# Patient Record
Sex: Female | Born: 1980 | Race: White | Hispanic: Yes | Marital: Single | Smoking: Never smoker
Health system: Southern US, Community
[De-identification: ages and names within clinical notes are randomized; demographics above are authoritative.]

## PROBLEM LIST (undated history)

## (undated) ENCOUNTER — Inpatient Hospital Stay (HOSPITAL_COMMUNITY): Payer: Self-pay

## (undated) DIAGNOSIS — O24419 Gestational diabetes mellitus in pregnancy, unspecified control: Secondary | ICD-10-CM

## (undated) DIAGNOSIS — D649 Anemia, unspecified: Secondary | ICD-10-CM

## (undated) HISTORY — DX: Anemia, unspecified: D64.9

## (undated) HISTORY — DX: Gestational diabetes mellitus in pregnancy, unspecified control: O24.419

---

## 2008-03-04 ENCOUNTER — Emergency Department (HOSPITAL_COMMUNITY): Admission: EM | Admit: 2008-03-04 | Discharge: 2008-03-04 | Payer: Self-pay | Admitting: Family Medicine

## 2008-03-24 ENCOUNTER — Ambulatory Visit: Payer: Self-pay | Admitting: Nurse Practitioner

## 2008-03-24 LAB — CONVERTED CEMR LAB
Nitrite: NEGATIVE
Specific Gravity, Urine: >=1.03
WBC Urine, dipstick: NEGATIVE
pH: 5

## 2008-03-25 ENCOUNTER — Ambulatory Visit: Payer: Self-pay | Admitting: *Deleted

## 2008-05-13 ENCOUNTER — Ambulatory Visit: Payer: Self-pay | Admitting: Nurse Practitioner

## 2008-05-13 LAB — CONVERTED CEMR LAB
AST: 15 units/L (ref 0–37)
Albumin: 4.4 g/dL (ref 3.5–5.2)
BUN: 10 mg/dL (ref 6–23)
Basophils Relative: 1 % (ref 0–1)
CO2: 27 meq/L (ref 19–32)
Calcium: 9.1 mg/dL (ref 8.4–10.5)
Chlamydia, DNA Probe: NEGATIVE
Chloride: 103 meq/L (ref 96–112)
Cholesterol: 152 mg/dL (ref 0–200)
Creatinine, Ser: 0.67 mg/dL (ref 0.40–1.20)
Glucose, Bld: 59 mg/dL — ABNORMAL LOW (ref 70–99)
HDL: 47 mg/dL (ref >39)
Hemoglobin: 13.5 g/dL (ref 12.0–15.0)
Ketones, urine, test strip: NEGATIVE
Lymphs Abs: 2.2 10*3/uL (ref 0.7–4.0)
MCHC: 31.7 g/dL (ref 30.0–36.0)
Monocytes Absolute: 0.7 10*3/uL (ref 0.1–1.0)
Monocytes Relative: 9 % (ref 3–12)
Neutro Abs: 5.2 10*3/uL (ref 1.7–7.7)
Nitrite: NEGATIVE
RBC: 4.62 M/uL (ref 3.87–5.11)
Total CHOL/HDL Ratio: 3.2
Triglycerides: 121 mg/dL (ref <150)
Urobilinogen, UA: 0.2
WBC: 8.3 10*3/uL (ref 4.0–10.5)

## 2008-05-14 ENCOUNTER — Encounter (INDEPENDENT_AMBULATORY_CARE_PROVIDER_SITE_OTHER): Payer: Self-pay | Admitting: Nurse Practitioner

## 2008-05-15 ENCOUNTER — Ambulatory Visit: Payer: Self-pay | Admitting: Nurse Practitioner

## 2008-08-05 ENCOUNTER — Ambulatory Visit: Payer: Self-pay | Admitting: Nurse Practitioner

## 2008-08-05 LAB — CONVERTED CEMR LAB: Beta hcg, urine, semiquantitative: NEGATIVE

## 2008-12-24 ENCOUNTER — Encounter: Payer: Self-pay | Admitting: Family Medicine

## 2008-12-24 ENCOUNTER — Ambulatory Visit: Payer: Self-pay | Admitting: Family Medicine

## 2008-12-24 LAB — CONVERTED CEMR LAB
Antibody Screen: NEGATIVE
Eosinophils Relative: 1 % (ref 0–5)
HCT: 40.1 % (ref 36.0–46.0)
Hemoglobin: 13.7 g/dL (ref 12.0–15.0)
Hepatitis B Surface Ag: NEGATIVE
Lymphocytes Relative: 23 % (ref 12–46)
Lymphs Abs: 1.8 10*3/uL (ref 0.7–4.0)
Monocytes Absolute: 0.6 10*3/uL (ref 0.1–1.0)
Neutro Abs: 5.5 10*3/uL (ref 1.7–7.7)
Rh Type: POSITIVE
Rubella: 468.3 intl units/mL — ABNORMAL HIGH
WBC: 7.9 10*3/uL (ref 4.0–10.5)

## 2009-01-07 ENCOUNTER — Ambulatory Visit: Payer: Self-pay | Admitting: Family Medicine

## 2009-01-07 ENCOUNTER — Encounter: Payer: Self-pay | Admitting: Family Medicine

## 2009-01-07 LAB — CONVERTED CEMR LAB
Chlamydia, DNA Probe: NEGATIVE
Hep B S Ab: NEGATIVE

## 2009-01-09 ENCOUNTER — Encounter: Payer: Self-pay | Admitting: Family Medicine

## 2009-01-09 ENCOUNTER — Ambulatory Visit: Payer: Self-pay | Admitting: Family Medicine

## 2009-02-02 ENCOUNTER — Ambulatory Visit: Payer: Self-pay | Admitting: Family Medicine

## 2009-02-02 ENCOUNTER — Encounter: Payer: Self-pay | Admitting: Family Medicine

## 2009-02-16 ENCOUNTER — Encounter: Payer: Self-pay | Admitting: Family Medicine

## 2009-03-03 ENCOUNTER — Encounter: Payer: Self-pay | Admitting: Family Medicine

## 2009-03-03 ENCOUNTER — Ambulatory Visit: Payer: Self-pay | Admitting: Family Medicine

## 2009-03-03 LAB — CONVERTED CEMR LAB: Whiff Test: NEGATIVE

## 2009-04-01 ENCOUNTER — Ambulatory Visit: Payer: Self-pay | Admitting: Family Medicine

## 2009-04-01 ENCOUNTER — Encounter: Payer: Self-pay | Admitting: Family Medicine

## 2009-04-15 ENCOUNTER — Ambulatory Visit: Payer: Self-pay | Admitting: Family Medicine

## 2009-04-16 ENCOUNTER — Ambulatory Visit: Payer: Self-pay | Admitting: Family Medicine

## 2009-04-16 ENCOUNTER — Encounter: Payer: Self-pay | Admitting: Family Medicine

## 2009-05-01 ENCOUNTER — Ambulatory Visit: Payer: Self-pay | Admitting: Family Medicine

## 2009-05-01 ENCOUNTER — Encounter: Payer: Self-pay | Admitting: Family Medicine

## 2009-05-04 LAB — CONVERTED CEMR LAB
Hemoglobin: 10.7 g/dL — ABNORMAL LOW (ref 12.0–15.0)
MCHC: 31.5 g/dL (ref 30.0–36.0)
RDW: 13.4 % (ref 11.5–15.5)

## 2009-05-14 ENCOUNTER — Ambulatory Visit: Payer: Self-pay | Admitting: Family Medicine

## 2009-05-14 ENCOUNTER — Encounter: Payer: Self-pay | Admitting: Family Medicine

## 2009-05-26 ENCOUNTER — Encounter: Payer: Self-pay | Admitting: Family Medicine

## 2009-05-26 ENCOUNTER — Ambulatory Visit: Payer: Self-pay | Admitting: Family Medicine

## 2009-05-27 ENCOUNTER — Encounter: Payer: Self-pay | Admitting: Family Medicine

## 2009-05-27 ENCOUNTER — Inpatient Hospital Stay (HOSPITAL_COMMUNITY): Admission: AD | Admit: 2009-05-27 | Discharge: 2009-05-31 | Payer: Self-pay | Admitting: Obstetrics & Gynecology

## 2009-05-27 ENCOUNTER — Ambulatory Visit: Payer: Self-pay | Admitting: Advanced Practice Midwife

## 2009-05-27 ENCOUNTER — Ambulatory Visit: Payer: Self-pay | Admitting: Family Medicine

## 2009-07-17 ENCOUNTER — Ambulatory Visit: Payer: Self-pay | Admitting: Family Medicine

## 2010-12-17 LAB — GC/CHLAMYDIA PROBE AMP, GENITAL: GC Probe Amp, Genital: NEGATIVE

## 2010-12-17 LAB — URINE MICROSCOPIC-ADD ON

## 2010-12-17 LAB — CBC
HCT: 37 % (ref 36.0–46.0)
MCHC: 34 g/dL (ref 30.0–36.0)
MCV: 87.1 fL (ref 78.0–100.0)
Platelets: 231 10*3/uL (ref 150–400)
RDW: 16.4 % — ABNORMAL HIGH (ref 11.5–15.5)
WBC: 11.2 10*3/uL — ABNORMAL HIGH (ref 4.0–10.5)

## 2010-12-17 LAB — WET PREP, GENITAL
Trich, Wet Prep: NONE SEEN
Yeast Wet Prep HPF POC: NONE SEEN

## 2010-12-17 LAB — URINALYSIS, ROUTINE W REFLEX MICROSCOPIC
Nitrite: NEGATIVE
Protein, ur: NEGATIVE mg/dL
Specific Gravity, Urine: 1.02 (ref 1.005–1.030)
Urobilinogen, UA: 0.2 mg/dL (ref 0.0–1.0)

## 2010-12-17 LAB — FETAL FIBRONECTIN: Fetal Fibronectin: POSITIVE

## 2010-12-17 LAB — STREP B DNA PROBE: Strep Group B Ag: NEGATIVE

## 2010-12-17 LAB — RPR: RPR Ser Ql: NONREACTIVE

## 2010-12-22 LAB — GLUCOSE, CAPILLARY: Glucose-Capillary: 147 mg/dL — ABNORMAL HIGH (ref 70–99)

## 2011-06-09 LAB — POCT I-STAT, CHEM 8
BUN: 7
Calcium, Ion: 1.22
Chloride: 101
Creatinine, Ser: 0.8

## 2011-06-09 LAB — URINE CULTURE: Culture: NO GROWTH

## 2011-06-09 LAB — POCT URINALYSIS DIP (DEVICE)
Glucose, UA: NEGATIVE
Operator id: 235561
Protein, ur: 30 — AB
Specific Gravity, Urine: 1.015
pH: 7

## 2011-07-11 ENCOUNTER — Other Ambulatory Visit: Payer: Self-pay | Admitting: Specialist

## 2011-07-11 DIAGNOSIS — IMO0002 Reserved for concepts with insufficient information to code with codable children: Secondary | ICD-10-CM

## 2011-07-11 DIAGNOSIS — R102 Pelvic and perineal pain: Secondary | ICD-10-CM

## 2011-07-13 ENCOUNTER — Ambulatory Visit
Admission: RE | Admit: 2011-07-13 | Discharge: 2011-07-13 | Disposition: A | Discharge status: Home / Self Care | Payer: No Typology Code available for payment source | Source: Ambulatory Visit | Attending: Specialist | Admitting: Specialist

## 2011-07-13 DIAGNOSIS — IMO0002 Reserved for concepts with insufficient information to code with codable children: Secondary | ICD-10-CM

## 2011-07-13 DIAGNOSIS — R102 Pelvic and perineal pain: Secondary | ICD-10-CM

## 2013-01-02 ENCOUNTER — Ambulatory Visit: Payer: Self-pay | Admitting: Physician Assistant

## 2013-01-02 VITALS — BP 110/66 | HR 78 | Temp 98.5°F | Resp 16 | Ht 64.75 in | Wt 174.6 lb

## 2013-01-02 DIAGNOSIS — G4700 Insomnia, unspecified: Secondary | ICD-10-CM

## 2013-01-02 DIAGNOSIS — L738 Other specified follicular disorders: Secondary | ICD-10-CM

## 2013-01-02 DIAGNOSIS — R4589 Other symptoms and signs involving emotional state: Secondary | ICD-10-CM

## 2013-01-02 DIAGNOSIS — F341 Dysthymic disorder: Secondary | ICD-10-CM

## 2013-01-02 DIAGNOSIS — L853 Xerosis cutis: Secondary | ICD-10-CM

## 2013-01-02 DIAGNOSIS — L659 Nonscarring hair loss, unspecified: Secondary | ICD-10-CM

## 2013-01-02 LAB — TSH: TSH: 2.5 u[IU]/mL (ref 0.350–4.500)

## 2013-01-02 MED ORDER — MELATONIN 3 MG PO CAPS: 3.0000 mg | ORAL_CAPSULE | Freq: Every day | ORAL | Status: DC

## 2013-01-02 NOTE — Patient Instructions (Addendum)
Take the melatonin (3mg ) once about 20-30 minutes before bed.  This will help you sleep.  In one week, you can increase the dose to two pills (6mg ) before bed if you need to.  I will let you know when your labs come back and if we need to treat anything.  Come back in 2 weeks and see me so we can see how you are doing.   Insomnio (Insomnia) El insomnio es un trastorno frecuente en la capacidad para dormirse o para Personal assistant dormido. Puede ser un problema crnico o un trastorno del momento. En ambos casos es un problema frecuente. Puede tratarse de un problema del momento cuando se relaciona con alguna situacin de estrs o preocupacin. Es un trastorno crnico cuando se relaciona con situaciones de Armed forces operational officer las horas de vigilia o con malos hbitos de sueo. Con el tiempo, la privacin del sueo en s misma, puede hacer que el problema empeore. Las cosas ms pequeas se agravan debido al cansancio y a que la capacidad para enfrentarlas disminuye. CAUSAS  Estrs, ansiedad y depresin.  Malos hbitos para dormir.  Distracciones como mirar TV en la cama.  Siestas en horarios prximos a la hora de ir a dormir.  Involucrarse en conversaciones de gran carga emocional antes de ir a dormir.  Leer textos tcnicos antes de dormir.  Consumir alcohol y otros sedantes. Ellos pueden empeorar el problema. Pueden modificar los patrones de sueo normales y la normal [place].  Consumir estimulantes como cafena algunas horas antes de ir a dormir.  Sndromes dolorosos y las dificultades respiratorias pueden causar insomnio.  Realizar ejercicios a ltima hora de la noche.  El cambio en las zonas horarias puede causar trastornos del sueo (jet lag). En algunos casos se recomienda que otra persona observe sus patrones de sueo. Deben observar los perodos en los que no respira durante la noche (apnea del sueo). Tambin deben observar cunto tiempo duran esos perodos. Si vive slo o no tiene  una persona de confianza que lo observe, podr concurrir a una clnica del sueo, en la que lo controlarn de [place] profesional. La apnea del sueo requiere controles y TEFL teacher. Entregue su historia clnica al profesional que lo asiste Tambin infrmele las observaciones que sus familiares hayan hecho con respecto a sus hbitos de sueo.  SNTOMAS  Sentir por la maana que no ha descansado lo suficiente.  Ansiedad y agitacin a la hora de dormir  Dificultad para dormirse o para Arts administrator sueo. TRATAMIENTO  El [employer] indicar un tratamiento para los trastornos subyacentes. Tambin podr aconsejarlo o ayudarlo si usted toma alcohol o se automedica con otras drogas. El tratamiento de los problemas subyacentes generalmente eliminarn los problemas de insomnio.  Podrn prescribirle medicamentos para usar durante un plazo breve. Generalmente no se recomiendan para un uso prolongado.  Generalmente no se recomiendan los medicamentos de venta libre para un uso prolongado. Podran causarle adiccin.  Puede hacer ms fcil el conciliar el sueo si realiza modificaciones en su estilo de vida tales como:  Utilizar tcnicas de relajacin que favorecen la respiracin y reducen la tensin muscular.  No practicar actividad fsica en las ltimas horas del da.  Modificar la dieta y el horario de la ltima comida. No tomar colaciones durante la noche  Trate de establecer una hora habitual para irse a dormir.  La psicoterapia puede ser de utilidad para los problemas que le ocasionan estrs y preocupaciones.  Si hay un ambiente ruidoso y no Advertising account executive, puede ser  til escuchar msica suave o sonidos agradables.  Suspenda los trabajos tediosos y [employer] al menos una hora antes de ir a dormir. INSTRUCCIONES PARA EL CUIDADO DOMICILIARIO  Lleve un diario. Infrmele al profesional que lo asiste sus progresos. Esto incluye todos los efectos secundarios de los medicamentos. Concurra  regularmente a la Psychiatrist con el profesional [place] nota de:  La hora en que se duerme.  Las horas en las que permanece despierto durante la noche.  La calidad del sueo.  Cmo se siente al da siguiente. Esta informacin ayudar a que Biochemist, clinical.   Levntese de la cama si permanece despierto por ms de 15 minutos. Lea o realice alguna actividad tranquila. Mantenga las luces bajas. Espere hasta que sienta sueo y luego vuelva a la cama.  Mantenga un ritmo constante de vigilia y sueo. Evite las siestas.  Practique actividad fsica con regularidad.  Evite las distracciones en el momento de ir a dormir. Entre las distracciones se Production assistant, radio ver televisin o Education officer, environmental alguna actividad intensa o [place], hacer las cuentas de los gastos domsticos.  Programe un ritual para irse a dormir. Mantenga una rutina familiar relacionada con el bao diario, el cepillado de los [place], irse a la cama todas las noches a la misma hora, Tax adviser [place]. La rutina aumenta el xito de conciliar el sueo ms rpido.  Use tcnicas de relajacin. Practique rutinas que favorezcan la respiracin y alivien la tensin muscular. Tambin puede ayudarlo la visualizacin de escenas pacficas. Tambin puede tratar de [employer] pensamientos problemticos o molestos si mantiene la mente ocupada con pensamientos repetitivos o aburridos, como el antiguo consejo de Multimedia programmer. Tambin puede ser ms creativo, e imaginar que planta hermosas flores en su jardn, una detrs de la otra.  Durante el da, trabaje para Chief Strategy Officer. Cuando no es posible, algunas de las sugerencias ya presentadas lo ayudarn a reducir la ansiedad que acompaa las situaciones de estrs. EST SEGURO QUE:   Comprende las instrucciones para el alta mdica.  Controlar su enfermedad.  Solicitar atencin mdica de inmediato segn las indicaciones. Document Released: 08/29/2005 Document Revised:  11/21/2011 [facility] Patient Information 2013 [place], [place].

## 2013-01-02 NOTE — Progress Notes (Signed)
Subjective:    Patient ID: Kara Dominguez, female    DOB: 13-Nov-1980, 32 y.o.   MRN: [id number]  HPI   Ms. Campillo Kara Dominguez is a very pleasant 32 yr old female here with concern for insomnia.  Pt's English is very limited.  Kara, clinical team member, helps with translation.  Pt states that she has been barely able to sleep for the last two months.  States she falls asleep but wakes up around 12am and sometimes doesn't fall back to sleep until 4-5am.  Does say that she gets about 7-8 hours of sleep, but I'm unsure if she is understanding what I'm asking.  During the day she states she sometimes doesn't feel like doing anything.  Lays down, but doesn't actually She has never had problems sleeping before.  Has not taken anything for sleep.  Does state that she often watches television while trying to fall asleep.  Limits caffeine.  States that she has also been having headaches, dry skin, and her hair is falling out.  She has never had problems with her thyroid before.   Does state that she has sometimes felt very sad over the past 1 yr.  She has a 74 yr old son that lives with his father in [place].  She misses him a lot.  Thinks maybe this is why she is feeling sick.  She works at [employer] and is able to work well but is fatigued.  She likes to cook at home when she is not working and still finds enjoyment in this.      Review of Systems  Constitutional: Positive for fatigue. Negative for fever and chills.  HENT: Negative.   Respiratory: Negative.   Cardiovascular: Negative.   Gastrointestinal: Negative.   Musculoskeletal: Negative.   Skin:       Dry skin, hair loss   Neurological: Negative.   Psychiatric/Behavioral: Positive for sleep disturbance (insomnia).       Objective:   Physical Exam  Vitals reviewed. Constitutional: She is oriented to person, place, and time. She appears well-developed and well-nourished. No distress.  HENT:  Head: Normocephalic and atraumatic.  Eyes:  Conjunctivae are normal. No scleral icterus.  Neck: Neck supple. No thyromegaly present.  Cardiovascular: Normal rate, regular rhythm and normal heart sounds.  Exam reveals no gallop and no friction rub.   No murmur heard. Pulmonary/Chest: Effort normal and breath sounds normal. She has no wheezes. She has no rales.  Lymphadenopathy:    She has no cervical adenopathy.  Neurological: She is alert and oriented to person, place, and time.  Skin: Skin is warm and dry.  Very dry skin noted on face at hairline and across cheeks  Psychiatric: She has a normal mood and affect. Her speech is normal and behavior is normal. Thought content normal.  Tearful at times when talking about son    PHQ9 score 11   Filed Vitals:   01/02/13 1431  BP: 110/66  Pulse: 78  Temp: 98.5 F (36.9 C)  Resp: 16        Assessment & Plan:  Insomnia - Plan: TSH, Melatonin 3 MG CAPS  --  Ms. Campillo Kara Dominguez is a 32 yr old female complaining of insomnia.  No history of this.  Etiology is unclear.  Pt feels stress because son is living with his father in [place].  Discussed the possibility of depression with her.  Her PHQ9 score is 11, which is relatively low.  Her affect and mood are normal today.  Will try melatonin for sleep initially.  3mg  qhs this week.  May increase to 6mg  qhs next week if needed.  Discussed sleep hygiene with pt and printed educational materials on this.  Will have pt RTC in 2 weeks to follow up on insomnia and monitor sadness/depressed mood.  Pt understands and is in agreement with this plan.  Dry skin - Plan: TSH  --  Check TSH, will follow up on this and initiate tx if needed  Hair loss - Plan: TSH  -- See above.  Feeling sad - Plan: TSH  --  Discussed the possibility of depression with her.  Her PHQ9 score is low today.  Will have her follow-up in 2 weeks to monitor symptoms.

## 2013-06-26 ENCOUNTER — Ambulatory Visit: Payer: Self-pay | Admitting: Emergency Medicine

## 2013-06-26 VITALS — BP 110/80 | HR 77 | Temp 98.0°F | Resp 16 | Ht 66.0 in | Wt 167.0 lb

## 2013-06-26 DIAGNOSIS — J209 Acute bronchitis, unspecified: Secondary | ICD-10-CM

## 2013-06-26 MED ORDER — HYDROCOD POLST-CHLORPHEN POLST 10-8 MG/5ML PO LQCR: 5.0000 mL | Freq: Two times a day (BID) | ORAL | Status: DC | PRN

## 2013-06-26 MED ORDER — AZITHROMYCIN 250 MG PO TABS: ORAL_TABLET | ORAL | Status: DC

## 2013-06-26 NOTE — Patient Instructions (Signed)
Bronquitis  (Bronchitis)  La bronquitis es una inflamación (el modo que tiene el organismo de reaccionar a una lesión o infección) de los bronquios Los bronquios son los conductos que se extienden desde la tráquea hasta los pulmones. Si la inflamación se agrava, puede causar la falta de aire.  CAUSAS  Las causas de la inflamación pueden ser:  · Un virus  · Gérmenes (bacteria).  · Polvo  · Alergenos  · La polución y muchos otros irritantes  Las células que revisten el árbol bronquial están cubiertas con pequeños pelos (cilias). Esta constantemente producen un movimiento desde los pulmones hacia la boca. De este modo se mantienen los pulmones libres de polución. Cuando estas células se irritan y no pueden cumplir su función, comienza a formarse la mucosidad. Esto produce la característica tos de la bronquitis. La tos es el mecanismo por el cual se limpian los pulmones cuando las cilias no pueden cumplir su función. Sin alguno de estos mecanismos protectores, el material se acumularía en los pulmones Entonces se desarrollaría una pulmonía.   El fumar es una de las causas más frecuentes de bronquitis y puede contribuir a la neumonía. Abandonar este hábito es lo más importante que puede hacer para beneficiarse.  TRATAMIENTO  · El médico le prescribirá antibióticos si la causa es una bacteria, y medicamentos para abrir las vías aéreas y facilitar la respiración. También puede recomendar o prescribir un expectorante. El expectorante aflojará la mucosidad para que pueda eliminarla. Sólo tome medicamentos de venta libre o prescriptos para calmar el dolor, las molestias, o bajar la fiebre según las indicaciones de su médico.  · Eliminar todo lo que causa el problema (por ejemplo el hábito de fumar) es fundamental para evitar que empeore.  · Un antitusígeno puede prescribirse para aliviar los síntomas de la tos.  · Podrán indicarle inhalantes para aliviar los síntomas actuales y ayudar a prevenir problemas futuros.  · Aquellos  que sufren bronquitis crónica (recurrente) puede ser necesaria la administración de corticoides.  SOLICITE ATENCIÓN MÉDICA INMEDIATAMENTE SI:  · Durante el tratamiento observa que elimina esputo similar a pus (purulento).  · Tiene fiebre.  · Su bebé tiene más de 3 meses y su temperatura rectal es de 102° F (38.9° C) o más.  · Su bebé tiene 3 meses o menos y su temperatura rectal es de 100.4° F (38° C) o más.  · Se siente cada vez más enfermo.  · Tiene cada vez más dificultad para respirar, tiene ruidos al respirar o le falta el aire.  Es necesario buscar atención médica inmediata si es una persona de edad avanzada o sufre alguna otra enfermedad.  ASEGURESE DE QUE:   · Comprende estas instrucciones.  · Controlará su enfermedad.  · Solicitará ayuda inmediatamente si no mejora o si empeora.  Document Released: 08/29/2005 Document Revised: 11/21/2011  ExitCare® Patient Information ©2014 ExitCare, LLC.

## 2013-06-26 NOTE — Progress Notes (Signed)
[facility] [address] [place] [address] [phone]  Date:  06/26/2013   Name:  Kara Dominguez   DOB:  1981/01/07   MRN:  [id number]  PCP:  No primary provider on file.    Chief Complaint: Dizziness and Emesis   History of Present Illness:  Kara Dominguez is a 32 y.o. very pleasant female patient who presents with the following:  Ill yesterday with dizziness and sore throat.  Now has a cough productive of green sputum.  No wheezing or shortness of breath. No nausea or vomiting.  No fever or chills.  Has a sore throat.  No coryza.  No rash or  Stool change.  No improvement with over the counter medications or other home remedies. Denies other complaint or health concern today.   There are no active problems to display for this patient.   History reviewed. No pertinent past medical history.  History reviewed. No pertinent past surgical history.  History  Substance Use Topics  . Smoking status: Never Smoker   . Smokeless tobacco: Not on file  . Alcohol Use: Not on file    History reviewed. No pertinent family history.  No Known Allergies  Medication list has been reviewed and updated.  Current Outpatient Prescriptions on File Prior to Visit  Medication Sig Dispense Refill  . Ferrous Sulfate (IRON) 325 (65 FE) MG TABS Take 1 tablet by mouth daily.        . Melatonin 3 MG CAPS Take 1 capsule (3 mg total) by mouth at bedtime.  60 capsule  1  . Prenatal Vit-Fe Fumarate-FA (PRENATAL S) 27-0.8 MG tablet Take 1 tablet by mouth daily.         No current facility-administered medications on file prior to visit.    Review of Systems:  As per HPI, otherwise negative.    Physical Examination: Filed Vitals:   06/26/13 1128  BP: 110/80  Pulse: 77  Temp: 98 F (36.7 C)  Resp: 16   Filed Vitals:   06/26/13 1128  Height: 5\' 6"  (1.676 m)  Weight: 167 lb (75.751 kg)   Body mass index is 26.97 kg/(m^2). Ideal Body Weight: Weight in  (lb) to have BMI = 25: 154.6  GEN: WDWN, NAD, Non-toxic, A & O x 3 HEENT: Atraumatic, Normocephalic. Neck supple. No masses, No LAD. Ears and Nose: No external deformity. CV: RRR, No M/G/R. No JVD. No thrill. No extra heart sounds. PULM: CTA B, no wheezes, crackles, rhonchi. No retractions. No resp. distress. No accessory muscle use. ABD: S, NT, ND, +BS. No rebound. No HSM. EXTR: No c/c/e NEURO Normal gait.  PSYCH: Normally interactive. Conversant. Not depressed or anxious appearing.  Calm demeanor.    Assessment and Plan: Bronchitis tussionex zpak  Signed,  Phillips Odor, MD

## 2014-04-04 ENCOUNTER — Ambulatory Visit: Payer: Self-pay

## 2014-05-27 ENCOUNTER — Encounter (HOSPITAL_COMMUNITY): Payer: Self-pay | Admitting: Emergency Medicine

## 2014-05-27 DIAGNOSIS — Z79899 Other long term (current) drug therapy: Secondary | ICD-10-CM | POA: Insufficient documentation

## 2014-05-27 DIAGNOSIS — O9989 Other specified diseases and conditions complicating pregnancy, childbirth and the puerperium: Secondary | ICD-10-CM | POA: Insufficient documentation

## 2014-05-27 DIAGNOSIS — O2390 Unspecified genitourinary tract infection in pregnancy, unspecified trimester: Secondary | ICD-10-CM | POA: Insufficient documentation

## 2014-05-27 DIAGNOSIS — N390 Urinary tract infection, site not specified: Secondary | ICD-10-CM | POA: Insufficient documentation

## 2014-05-27 LAB — URINALYSIS, ROUTINE W REFLEX MICROSCOPIC
Bilirubin Urine: NEGATIVE
Glucose, UA: 250 mg/dL — AB
KETONES UR: NEGATIVE mg/dL
Nitrite: NEGATIVE
PROTEIN: 30 mg/dL — AB
Specific Gravity, Urine: 1.017 (ref 1.005–1.030)
UROBILINOGEN UA: 1 mg/dL (ref 0.0–1.0)
pH: 7.5 (ref 5.0–8.0)

## 2014-05-27 LAB — URINE MICROSCOPIC-ADD ON

## 2014-05-27 NOTE — ED Notes (Signed)
Pt reports dysuria, urinary frequency and hematuria since AM. Pt denies flank pain. Denies fever/chills. Pt [redacted] weeks pregnant, denies abdominal cramping or vaginal bleeding. NAD.

## 2014-05-28 ENCOUNTER — Emergency Department (HOSPITAL_COMMUNITY)
Admission: EM | Admit: 2014-05-28 | Discharge: 2014-05-28 | Disposition: A | Discharge status: Home / Self Care | Payer: Self-pay | Attending: Emergency Medicine | Admitting: Emergency Medicine

## 2014-05-28 DIAGNOSIS — N390 Urinary tract infection, site not specified: Secondary | ICD-10-CM

## 2014-05-28 DIAGNOSIS — Z3490 Encounter for supervision of normal pregnancy, unspecified, unspecified trimester: Secondary | ICD-10-CM

## 2014-05-28 MED ORDER — CEPHALEXIN 250 MG PO CAPS
500.0000 mg | ORAL_CAPSULE | Freq: Once | ORAL | Status: AC
  Administered 2014-05-28: 500 mg via ORAL
  Filled 2014-05-28: qty 2

## 2014-05-28 MED ORDER — CEPHALEXIN 500 MG PO CAPS: 500.0000 mg | ORAL_CAPSULE | Freq: Four times a day (QID) | ORAL | Status: DC

## 2014-05-28 NOTE — ED Provider Notes (Signed)
CSN: [id number]     Arrival date & time 05/27/14  2136 History   First MD Initiated Contact with Patient 05/28/14 0146     Chief Complaint  Patient presents with  . Dysuria  . Hematuria     (Consider location/radiation/quality/duration/timing/severity/associated sxs/prior Treatment) HPI Comments: G4P3 with normal pregnancy thus far, about 4 months pregnant per [place] comes in with cc of dysuria, polyuria and hematuria. Pt reports having the symptoms starting last night. Pt has pain only with urination. No cramping abd pain, no vaginal bleeding (only hematuria), no vaginal discharge, no trauma. Pt denies back pain. No nausea, emesis, fevers, chills.  Patient is a 33 y.o. female presenting with dysuria and hematuria. The history is provided by the patient and the spouse.  Dysuria Associated symptoms: no abdominal pain, no flank pain, no nausea and no vomiting   Hematuria Pertinent negatives include no chest pain, no abdominal pain, no headaches and no shortness of breath.    No past medical history on file. History reviewed. No pertinent past surgical history. No family history on file. History  Substance Use Topics  . Smoking status: Never Smoker   . Smokeless tobacco: Not on file  . Alcohol Use: Not on file   OB History   Grav Para Term Preterm Abortions TAB SAB Ect Mult Living   1              Review of Systems  Constitutional: Negative for activity change.  Respiratory: Negative for shortness of breath.   Cardiovascular: Negative for chest pain.  Gastrointestinal: Negative for nausea, vomiting and abdominal pain.  Genitourinary: Positive for dysuria, frequency and hematuria. Negative for flank pain.  Musculoskeletal: Negative for neck pain.  Neurological: Negative for headaches.      Allergies  Review of patient's allergies indicates no known allergies.  Home Medications   Prior to Admission medications   Medication Sig Start Date End Date Taking? Authorizing  Provider  Ferrous Sulfate (IRON) 325 (65 FE) MG TABS Take 1 tablet by mouth daily.     Yes Historical Provider, MD  Prenatal Vit-Fe Fumarate-FA (PRENATAL S) 27-0.8 MG tablet Take 1 tablet by mouth daily.     Yes Historical Provider, MD   BP 112/61  Pulse 83  Temp(Src) 98.1 F (36.7 C) (Oral)  Resp 16  SpO2 100% Physical Exam  Nursing note and vitals reviewed. Constitutional: She is oriented to person, place, and time. She appears well-developed and well-nourished.  HENT:  Head: Normocephalic and atraumatic.  Eyes: EOM are normal. Pupils are equal, round, and reactive to light.  Neck: Neck supple.  Cardiovascular: Normal rate, regular rhythm and normal heart sounds.   No murmur heard. Pulmonary/Chest: Effort normal. No respiratory distress.  Abdominal: Soft. She exhibits no distension. There is no tenderness. There is no rebound and no guarding.  Neurological: She is alert and oriented to person, place, and time.  Skin: Skin is warm and dry.    ED Course  Procedures (including critical care time) Labs Review Labs Reviewed  URINALYSIS, ROUTINE W REFLEX MICROSCOPIC - Abnormal; Notable for the following:    APPearance CLOUDY (*)    Glucose, UA 250 (*)    Hgb urine dipstick LARGE (*)    Protein, ur 30 (*)    Leukocytes, UA LARGE (*)    All other components within normal limits  URINE MICROSCOPIC-ADD ON - Abnormal; Notable for the following:    Bacteria, UA MANY (*)    All other components within normal  limits  URINE CULTURE    Imaging Review No results found.   EKG Interpretation None      MDM   Final diagnoses:  UTI (lower urinary tract infection)  Pregnancy   Pt comes in with cc of dysuria, hematuria, and polyuria. Pt is pregnant, and US shows UTI. Pt has a complex UTi given she is pregnant. No systemic signs, PO challenge passed. Keflex prescribed. Pt has no abd pain, vaginal bleeding. FHT in the 130s, which is reassuring. Will d.c. Return precautions  discussed. She has OB appt in 10 days,  Derwood Kaplan, MD 05/28/14 [phone]

## 2014-05-28 NOTE — Discharge Instructions (Signed)
Embarazo e infeccin del tracto urinario (Pregnancy and Urinary Tract Infection) Una infeccin urinaria (IU) puede ocurrir en Clinical cytogeneticist del tracto urinario. La infeccin urinaria puede [employer]s utteres, los riones (pielonefritis), la vejiga (cistitis) y Geologist, engineering (uretritis). Todas las mujeres embarazadas deben ser estudiadas para diagnosticar la presencia de bacterias en el tracto urinario. La identificacin y el tratamiento de una infeccin urinaria disminuye el riesgo de un parto prematuro y de Actor infecciones ms graves en la madre y el beb. CAUSAS Las bacterias causan casi todas las infecciones urinarias.  FACTORES DE RIESGO Hay muchos factores que pueden aumentar sus probabilidades de contraer una infeccin urinaria (IU) durante el [place]. Pueden ser:  Lucilla Edin uretra corta.  Falta de aseo y malos hbitos de higiene.  [place].  Obstruccin de la orina en el tracto urinario.  Problemas con los msculos o nervios plvicos.  Diabetes.  Obesidad.  Problemas en la vejiga despus de tener varios hijos.  Antecedentes de infeccin urinaria. SIGNOS Y SNTOMAS   Dolor, ardor o sensacin de ardor al [employer].  Sentir la necesidad de Garment/textile technologist de inmediato [place]).  Prdida del control vesical (incontinencia urinaria).  Orinar con ms frecuencia de lo comn en el embarazo.  Malestar en la zona inferior del abdomen o en la espalda.  Bennie Hind turbia.  Sangre en la orina (hematuria).  Cristy Hilts. Cuando se infectan los riones, los sntomas pueden ser:  Dolor de espalda.  Dolor lateral en el lado derecho ms que en el lado izquierdo.  Cristy Hilts.  Escalofros.  Nuseas.  Vmitos. DIAGNSTICO  Una infeccin del tracto urinario se suele diagnosticar a travs de la orina. A veces se realizan pruebas y procedimientos adicionales. Estos pueden ser:  Steward Drone de los riones, los urteres, la vejiga y Geologist, engineering.  Observar la vejiga con un  tubo que ilumina (cistoscopa). TRATAMIENTO Por lo general, las IU pueden tratarse con medicamentos antibiticos.  INSTRUCCIONES PARA EL CUIDADO EN EL HOGAR   Tome slo medicamentos de venta libre o recetados, segn las indicaciones del mdico. Si le han recetado antibiticos, tmelos segn las indicaciones. Tmelos todos, aunque se sienta mejor.  Beba suficiente lquido para Consulting civil engineer orina clara o de color amarillo plido.  No tenga relaciones sexuales hasta que la infeccin haya desaparecido o el mdico la autorice.  Asegrese de [place] [employer] hagan estudios para Hydrographic surveyor una infeccin urinaria durante el [place]. Estas infecciones suelen reaparecer. Para prevenir una infeccin urinaria en el futuro  Practique buenos hbitos higinicos. Siempre debe limpiarse desde adelante hacia atrs. Use el tissue slo una vez.  No retenga la orina. Orine tan pronto como sea posible cuando tenga ganas.  No se haga duchas vaginales ni use desodorantes en aerosol.  Lave con agua tibia y jabn alrededor de la zona genital y el ano.  Vace la vejiga antes y despus de Clinical biochemist.  Use ropa interior con algodn en la entrepierna.  Evite la cafena y las bebidas gaseosas. Estas sustancias irritan la vejiga.  Beba jugo de arndanos o tome comprimidos de arndano. Esto puede disminuir el riesgo de sufrir una infeccin urinaria.  No beba alcohol.  Cumpla con las visitas de control y hgase todos los anlisis segn lo programado. SOLICITE ATENCIN MDICA SI:   Los sntomas empeoran.  Tiene fiebre an despus de 2 das [place].  Tiene una erupcin.  Siente que usted tiene problemas con los medicamentos recetados.  Tiene flujo vaginal anormal. SOLICITE ATENCIN MDICA DE INMEDIATO SI:  Siente dolor en la espalda o a los lados. °· Tiene escalofríos. °· Observa sangre en la orina. °· Tiene náuseas o vómitos. °· Siente contracciones en el útero. °· Tiene una  perdida de líquido en chorro por la vagina. °ASEGÚRESE DE QUE: °· Comprende estas instrucciones.   °· Controlará su afección.   °· Recibirá ayuda de inmediato si no mejora o si empeora.   °Document Released: 05/23/2012 Document Revised: 06/19/2013 °ExitCare® Patient Information ©2015 ExitCare, LLC. This information is not intended to replace advice given to you by your health care provider. Make sure you discuss any questions you have with your health care provider. ° °

## 2014-05-30 LAB — URINE CULTURE

## 2014-05-31 ENCOUNTER — Telehealth (HOSPITAL_BASED_OUTPATIENT_CLINIC_OR_DEPARTMENT_OTHER): Payer: Self-pay | Admitting: Emergency Medicine

## 2014-05-31 NOTE — Telephone Encounter (Signed)
Post ED Visit - Positive Culture Follow-up  Culture report reviewed by antimicrobial stewardship pharmacist:  Wes Dulaney, Pharm.D., BCPS  Celedonio Miyamoto, [address].D., BCPS  Georgina Pillion, Pharm.D., BCPS  [place], [address].D., BCPS, AAHIVP  Estella Husk, Pharm.D., BCPS, AAHIVP  Carly Sabat, Pharm.D.  Enzo Bi, [address].D.  Positive urine culture Treated with Cephalexin  po caps qid x 5 days, organism sensitive to the same and no further patient follow-up is required at this time.  Berle Mull 05/31/2014, 5:10 PM

## 2014-06-02 ENCOUNTER — Other Ambulatory Visit: Payer: Self-pay

## 2014-06-02 DIAGNOSIS — Z331 Pregnant state, incidental: Secondary | ICD-10-CM

## 2014-06-02 NOTE — Progress Notes (Signed)
NEW OB LABS DONE TODAY Kara Dominguez 

## 2014-06-03 LAB — OBSTETRIC PANEL
ANTIBODY SCREEN: NEGATIVE
BASOS ABS: 0 10*3/uL (ref 0.0–0.1)
BASOS PCT: 0 % (ref 0–1)
EOS ABS: 0.3 10*3/uL (ref 0.0–0.7)
EOS PCT: 3 % (ref 0–5)
HEMATOCRIT: 36.9 % (ref 36.0–46.0)
Hemoglobin: 12.1 g/dL (ref 12.0–15.0)
Hepatitis B Surface Ag: NEGATIVE
Lymphocytes Relative: 16 % (ref 12–46)
Lymphs Abs: 1.4 10*3/uL (ref 0.7–4.0)
MCH: 29 pg (ref 26.0–34.0)
MCHC: 32.8 g/dL (ref 30.0–36.0)
MCV: 88.5 fL (ref 78.0–100.0)
MONO ABS: 0.7 10*3/uL (ref 0.1–1.0)
Monocytes Relative: 8 % (ref 3–12)
Neutro Abs: 6.4 10*3/uL (ref 1.7–7.7)
Neutrophils Relative %: 73 % (ref 43–77)
PLATELETS: 262 10*3/uL (ref 150–400)
RBC: 4.17 MIL/uL (ref 3.87–5.11)
RDW: 13.4 % (ref 11.5–15.5)
RUBELLA: 9.73 {index} — AB (ref <0.90)
Rh Type: POSITIVE
WBC: 8.7 10*3/uL (ref 4.0–10.5)

## 2014-06-03 LAB — HIV ANTIBODY (ROUTINE TESTING W REFLEX): HIV: NONREACTIVE

## 2014-06-03 LAB — CULTURE, OB URINE
COLONY COUNT: NO GROWTH
Organism ID, Bacteria: NO GROWTH

## 2014-06-09 ENCOUNTER — Ambulatory Visit (INDEPENDENT_AMBULATORY_CARE_PROVIDER_SITE_OTHER): Payer: Self-pay | Admitting: Family Medicine

## 2014-06-09 ENCOUNTER — Other Ambulatory Visit (HOSPITAL_COMMUNITY): Admission: RE | Admit: 2014-06-09 | Payer: Self-pay | Source: Ambulatory Visit | Admitting: Family Medicine

## 2014-06-09 ENCOUNTER — Encounter: Payer: Self-pay | Admitting: Family Medicine

## 2014-06-09 VITALS — BP 120/68 | HR 91 | Temp 98.6°F | Wt 195.0 lb

## 2014-06-09 DIAGNOSIS — Z331 Pregnant state, incidental: Secondary | ICD-10-CM

## 2014-06-09 DIAGNOSIS — Z124 Encounter for screening for malignant neoplasm of cervix: Secondary | ICD-10-CM

## 2014-06-09 DIAGNOSIS — E669 Obesity, unspecified: Secondary | ICD-10-CM

## 2014-06-09 DIAGNOSIS — Z23 Encounter for immunization: Secondary | ICD-10-CM

## 2014-06-09 DIAGNOSIS — Z3490 Encounter for supervision of normal pregnancy, unspecified, unspecified trimester: Secondary | ICD-10-CM

## 2014-06-09 DIAGNOSIS — Z8751 Personal history of pre-term labor: Secondary | ICD-10-CM

## 2014-06-09 LAB — GLUCOSE, CAPILLARY: Glucose-Capillary: 188 mg/dL — ABNORMAL HIGH (ref 70–99)

## 2014-06-09 NOTE — Patient Instructions (Signed)
Segundo trimestre del embarazo  (Second Trimester of Pregnancy)  El segundo trimestre del embarazo se extiende desde la semana 13 hasta la semana 28, del 4 al 6 mes. En general, es el momento del embarazo en el que se sentir mejor. En general, las nuseas matutinas disminuyen o desaparecen. Tendr ms energa y podr aumentarle el apetito. El beb por nacer (feto) se desarrolla rpidamente. Hacia el final del sexto mes, el beb mide aproximadamente 9 pulgadas (23 cm) y pesa alrededor de 1 libras (700 g). Es probable que sienta mover al beb (dar pataditas) entre las 18 y 20 semanas del embarazo. CUIDADOS EN EL HOGAR   Evite fumar, consumir hierbas y beber alcohol. Evite los frmacos que no apruebe el mdico.  Slo tome los medicamentos que le haya indicado su mdico. Algunos medicamentos son seguros para tomar durante el embarazo y otros no lo son.  Haga ejercicios slo como le indique el mdico. Deje de hacer ejercicios si comienza a tener clicos.  Haga comidas regulares y sanas.  Use un sostn que le brinde buen soporte si sus mamas estn sensibles.  No utilice la baera con agua caliente, baos turcos y saunas.  Colquese el cinturn de seguridad cuando conduzca.  Evite comer carne cruda y el contacto con los utensilios y desperdicios de los gatos.  Tome las vitaminas indicadas para la etapa prenatal.  Trate de tomar medicamentos para mover el intestino (laxantes) segn lo necesario y si su mdico la autoriza. Consuma ms fibra comiendo frutas y vegetales frescos y granos enteros. Beba gran cantidad de lquido para mantener el pis (orina) de tono claro o amarillo plido.  Tome baos de agua tibia (baos de asiento) para calmar el dolor o las molestias causadas por las hemorroides. Use una crema para las hemorroides si el mdico la autoriza.  Si tiene venas hinchadas y abultadas (venas varicosas), use medias de soporte. Eleve (levante) los pies durante 15 minutos, 3 o 4 veces por  da. Limite el consumo de sal en su dieta.  Evite levantar objetos pesados, usar tacones altos y sintese derecha.  Descanse con las piernas elevadas si tiene calambres o dolor de cintura.  Visite a su dentista si no lo ha hecho durante el embarazo. Use un cepillo de dientes blando para higienizarse los dientes. Use suavemente el hilo dental.  Puede tener sexo (relaciones sexuales) siempre que el mdico la autorice.  Concurra a los controles mdicos. SOLICITE AYUDA SI:   Siente mareos.  Siente clicos intensos en el estmago, en la espalda o en el vientre (abdomen).  Siente un dolor persistente en la zona del vientre.  Tiene malestar estomacal (nuseas), devuelve (vomita), o tiene deposiciones acuosas (diarrea).  Advierte un olor ftido que proviene de la vagina.  Siente dolor al hacer pis (orinar). SOLICITE AYUDA DE INMEDIATO SI:   Tiene fiebre.  Pierde lquido por la vagina.  Tiene sangrando o pequeas prdidas vaginales.  Siente dolor intenso o clicos en el abdomen.  Sube o baja de peso rpidamente.  Tiene dificultad para respirar o siente dolor en el pecho.  Sbitamente se le hinchan el rostro, las manos, los tobillos, los pies o las piernas.  No ha sentido los movimientos del beb durante una hora.  Siente un dolor de cabeza intenso que no se alivia con medicamentos.  Su visin se modifica. Document Released: 05/01/2013 ExitCare Patient Information 2015 [employer]. This information is not intended to replace advice given to you by your health care provider. Make sure you   discuss any questions you have with your health care provider.  

## 2014-06-09 NOTE — Progress Notes (Signed)
No AAM interpretor.  Used Artist ID# [id number]

## 2014-06-09 NOTE — Progress Notes (Signed)
Kara Dominguez is a 33 y.o. yo [phone] at [redacted]w[redacted]d who presents for her initial prenatal visit. Pregnancy is planned She reports decreased fetal movement previously while she was on the keflex for her UTI. She decreased the frequency of taking this and the movement normalized. She has noted good movement today.. She  is taking PNV. See flow sheet for details.  PMH, POBH, FH, meds, allergies and Social Hx reviewed.  Prenatal exam:Gen: Well nourished, well developed.  No distress.  Vitals noted. HEENT: Normocephalic, atraumatic.  Abd: soft, NTND. +BS.  Uterus not appreciated above umbilicus. GU: Normal external female genitalia without lesions.  Nl vaginal, well rugated without lesions. No vaginal discharge.  Bimanual exam: No adnexal mass or TTP. No CMT.   Ext: No clubbing, cyanosis or edema. Psych: Normal grooming and dress.  Not depressed or anxious appearing.  Normal thought content and process without flight of ideas or looseness of associations   Assessment/plan: 1) Pregnancy [redacted]w[redacted]d doing well.  Current pregnancy issues include prior E coli UTI now adequately treated with negative OB UCx, prior preterm delivery, elevated 1 hour early glucola. Patient will return for 3 hour glucola. Patient will be referred to high risk clinic given prior delivery <34 weeks for consideration of 17-P shots and also likely for GDM pending 3 hour glucola. Dating is reliable Prenatal labs reviewed, notable for all normal. Bleeding and pain precautions reviewed. Importance of prenatal vitamins reviewed.  Genetic screening offered.  Early glucola is indicated. Kick counts reviewed. She is to stop taking her keflex at this time given negative culture.    Follow up 4 weeks.

## 2014-06-10 ENCOUNTER — Encounter: Payer: Self-pay | Admitting: Family Medicine

## 2014-06-10 DIAGNOSIS — Z8751 Personal history of pre-term labor: Secondary | ICD-10-CM | POA: Insufficient documentation

## 2014-06-10 LAB — CERVICOVAGINAL ANCILLARY ONLY
CHLAMYDIA, DNA PROBE: NEGATIVE
Neisseria Gonorrhea: NEGATIVE

## 2014-06-10 LAB — CYTOLOGY - PAP

## 2014-06-11 ENCOUNTER — Encounter: Payer: Self-pay | Admitting: Family Medicine

## 2014-06-13 ENCOUNTER — Other Ambulatory Visit: Payer: Self-pay | Admitting: Family Medicine

## 2014-06-13 ENCOUNTER — Ambulatory Visit (HOSPITAL_COMMUNITY)
Admission: RE | Admit: 2014-06-13 | Discharge: 2014-06-13 | Disposition: A | Discharge status: Home / Self Care | Payer: Self-pay | Source: Ambulatory Visit | Attending: Family Medicine | Admitting: Family Medicine

## 2014-06-13 DIAGNOSIS — Z3490 Encounter for supervision of normal pregnancy, unspecified, unspecified trimester: Secondary | ICD-10-CM

## 2014-06-13 DIAGNOSIS — Z3689 Encounter for other specified antenatal screening: Secondary | ICD-10-CM

## 2014-06-13 DIAGNOSIS — O09522 Supervision of elderly multigravida, second trimester: Secondary | ICD-10-CM

## 2014-06-13 DIAGNOSIS — Z3482 Encounter for supervision of other normal pregnancy, second trimester: Secondary | ICD-10-CM | POA: Insufficient documentation

## 2014-06-13 DIAGNOSIS — Z3A19 19 weeks gestation of pregnancy: Secondary | ICD-10-CM

## 2014-06-16 ENCOUNTER — Other Ambulatory Visit (INDEPENDENT_AMBULATORY_CARE_PROVIDER_SITE_OTHER): Payer: Self-pay

## 2014-06-16 DIAGNOSIS — Z331 Pregnant state, incidental: Secondary | ICD-10-CM

## 2014-06-16 DIAGNOSIS — Z3482 Encounter for supervision of other normal pregnancy, second trimester: Secondary | ICD-10-CM

## 2014-06-16 LAB — GLUCOSE, CAPILLARY: GLUCOSE-CAPILLARY: 78 mg/dL (ref 70–99)

## 2014-06-16 NOTE — Progress Notes (Signed)
3 HR GTT DONE TODAY Dwane Andres 

## 2014-06-17 LAB — GLUCOSE TOLERANCE, 3 HOURS
GLUCOSE 3 HOUR GTT: 111 mg/dL (ref 70–144)
GLUCOSE, 2 HOUR-GESTATIONAL: 168 mg/dL — AB (ref 70–164)
GLUCOSE, FASTING-GESTATIONAL: 78 mg/dL (ref 70–104)
Glucose Tolerance, 1 hour: 190 mg/dL — ABNORMAL HIGH (ref 70–189)

## 2014-07-07 ENCOUNTER — Other Ambulatory Visit: Payer: Self-pay | Admitting: Obstetrics and Gynecology

## 2014-07-07 ENCOUNTER — Encounter: Payer: Self-pay | Admitting: Obstetrics and Gynecology

## 2014-07-07 ENCOUNTER — Ambulatory Visit (INDEPENDENT_AMBULATORY_CARE_PROVIDER_SITE_OTHER): Payer: Self-pay | Admitting: Obstetrics and Gynecology

## 2014-07-07 VITALS — BP 104/87 | HR 91 | Temp 98.0°F | Wt 197.2 lb

## 2014-07-07 DIAGNOSIS — Z8751 Personal history of pre-term labor: Secondary | ICD-10-CM

## 2014-07-07 DIAGNOSIS — Z3492 Encounter for supervision of normal pregnancy, unspecified, second trimester: Secondary | ICD-10-CM

## 2014-07-07 DIAGNOSIS — O24419 Gestational diabetes mellitus in pregnancy, unspecified control: Secondary | ICD-10-CM | POA: Insufficient documentation

## 2014-07-07 DIAGNOSIS — O0932 Supervision of pregnancy with insufficient antenatal care, second trimester: Secondary | ICD-10-CM

## 2014-07-07 DIAGNOSIS — O35EXX0 Maternal care for other (suspected) fetal abnormality and damage, fetal genitourinary anomalies, not applicable or unspecified: Secondary | ICD-10-CM

## 2014-07-07 DIAGNOSIS — O358XX0 Maternal care for other (suspected) fetal abnormality and damage, not applicable or unspecified: Secondary | ICD-10-CM

## 2014-07-07 DIAGNOSIS — O0990 Supervision of high risk pregnancy, unspecified, unspecified trimester: Secondary | ICD-10-CM | POA: Insufficient documentation

## 2014-07-07 DIAGNOSIS — O283 Abnormal ultrasonic finding on antenatal screening of mother: Secondary | ICD-10-CM

## 2014-07-07 DIAGNOSIS — O0930 Supervision of pregnancy with insufficient antenatal care, unspecified trimester: Secondary | ICD-10-CM | POA: Insufficient documentation

## 2014-07-07 DIAGNOSIS — O0992 Supervision of high risk pregnancy, unspecified, second trimester: Secondary | ICD-10-CM

## 2014-07-07 LAB — POCT URINALYSIS DIP (DEVICE)
Bilirubin Urine: NEGATIVE
GLUCOSE, UA: NEGATIVE mg/dL
KETONES UR: NEGATIVE mg/dL
LEUKOCYTES UA: NEGATIVE
Nitrite: NEGATIVE
PROTEIN: NEGATIVE mg/dL
SPECIFIC GRAVITY, URINE: 1.02 (ref 1.005–1.030)
UROBILINOGEN UA: 0.2 mg/dL (ref 0.0–1.0)
pH: 7 (ref 5.0–8.0)

## 2014-07-07 MED ORDER — PRENATAL VITAMINS 0.8 MG PO TABS: 1.0000 | ORAL_TABLET | Freq: Every day | ORAL | Status: DC

## 2014-07-07 MED ORDER — HYDROXYPROGESTERONE CAPROATE 250 MG/ML IM OIL: 250.0000 mg | TOPICAL_OIL | Freq: Once | INTRAMUSCULAR | Status: DC

## 2014-07-07 MED ORDER — PROGESTERONE MICRONIZED 200 MG PO CAPS: 200.0000 mg | ORAL_CAPSULE | Freq: Every day | ORAL | Status: DC

## 2014-07-07 MED ORDER — IRON 325 (65 FE) MG PO TABS: 1.0000 | ORAL_TABLET | Freq: Every day | ORAL | Status: DC

## 2014-07-07 NOTE — Progress Notes (Signed)
Here for Initial visit, [facility]. Used interpreter Darel HongMariaElena Jimenez. Given new patient information.

## 2014-07-07 NOTE — Progress Notes (Addendum)
Nutrition Note:  1st visit Pt seen for GDM diet education Wt. Gain > expected. Pt. Given verbal & written GDM education Eats 3 meals/day with a variety of foods.  No food allergies & no N/V reported.  PNV daily. Agrees to follow GDM diet including 3 meals and 3 snacks and proper CHO/protein combination. Discussed wt. Gain goals of 15-25# and incorporating physical activity. [facility] F/u in 2-4 weeks. Candice C. Earlene Plateravis, MPH, RD, LDN

## 2014-07-07 NOTE — Addendum Note (Signed)
Addended by: William DaltonMCEACHERN, Drewey Begue A on: 07/07/2014 11:21 AM   Modules accepted: Orders

## 2014-07-07 NOTE — Progress Notes (Signed)
Initial OB today. [facility] for history of preterm delivery and abnormal 3 hour gtt.  1. GDM, new diagnosis. Has not been checking blood sugars and diabetic educator not here today. Nutrition counseling today. Return to clinic in 1 week for diabetic education.  2. History of preterm delivery at ~6 months gestation. Start 17P today.  3. Unilateral pyectasis noted on fetal anatomy scan. Repeat at 32 weeks.  4. Routine PNC. Labs reviewed today. GC/CT collected today. PTL/FM precautions reviewed.

## 2014-07-08 LAB — GC/CHLAMYDIA PROBE AMP
CT PROBE, AMP APTIMA: NEGATIVE
GC PROBE AMP APTIMA: NEGATIVE

## 2014-07-10 ENCOUNTER — Telehealth: Payer: Self-pay | Admitting: *Deleted

## 2014-07-10 NOTE — Telephone Encounter (Signed)
Message copied by Henri MedalHARTSELL, JAZMIN M on Thu Jul 10, 2014  4:24 PM ------      Message from: Glori LuisSONNENBERG, ERIC G      Created: Tue Jul 08, 2014  6:52 PM       [facility] She does not need to keep her 07/16/14 appointment with me. Could you please call her to cancel this. [facility] Thanks.             Minerva AreolaEric       ----- Message -----         From: Feliz BeamJazmin Hartsell, CMA         Sent: 06/26/2014   8:45 AM           To: Glori LuisEric G Sonnenberg, MD            Pt has not been called yet or scheduled.  Did try calling their office [phone] but there was no answer or option to leave a message. Jazmin Hartsell,CMA      ----- Message -----         From: Glori LuisEric G Sonnenberg, MD         Sent: 06/25/2014   5:36 PM           To: [facility]            Patient was referred to high risk OB clinic. It looks like it is awaiting scheduling. Could you check as to why she has not yet been scheduled? Thanks.            Eric              ------

## 2014-07-10 NOTE — Telephone Encounter (Signed)
Pt is aware and appt with Dr. Birdie SonsSonnenberg was cancelled. Jazmin Hartsell,CMA

## 2014-07-14 ENCOUNTER — Encounter: Payer: Self-pay | Admitting: Obstetrics and Gynecology

## 2014-07-14 ENCOUNTER — Ambulatory Visit (INDEPENDENT_AMBULATORY_CARE_PROVIDER_SITE_OTHER): Payer: Self-pay | Admitting: Family Medicine

## 2014-07-14 ENCOUNTER — Encounter: Payer: Self-pay | Attending: Family Medicine | Admitting: *Deleted

## 2014-07-14 VITALS — BP 104/58 | HR 87 | Temp 97.4°F | Wt 193.6 lb

## 2014-07-14 DIAGNOSIS — O24419 Gestational diabetes mellitus in pregnancy, unspecified control: Secondary | ICD-10-CM | POA: Insufficient documentation

## 2014-07-14 DIAGNOSIS — O0992 Supervision of high risk pregnancy, unspecified, second trimester: Secondary | ICD-10-CM

## 2014-07-14 DIAGNOSIS — Z3A24 24 weeks gestation of pregnancy: Secondary | ICD-10-CM | POA: Insufficient documentation

## 2014-07-14 DIAGNOSIS — Z713 Dietary counseling and surveillance: Secondary | ICD-10-CM | POA: Insufficient documentation

## 2014-07-14 LAB — POCT URINALYSIS DIP (DEVICE)
Bilirubin Urine: NEGATIVE
Glucose, UA: NEGATIVE mg/dL
Hgb urine dipstick: NEGATIVE
Ketones, ur: 15 mg/dL — AB
NITRITE: NEGATIVE
PH: 5.5 (ref 5.0–8.0)
PROTEIN: NEGATIVE mg/dL
Specific Gravity, Urine: >=1.03 (ref 1.005–1.030)
Urobilinogen, UA: 0.2 mg/dL (ref 0.0–1.0)

## 2014-07-14 NOTE — Progress Notes (Signed)
Kara Dominguez present for Spanish interpreter.

## 2014-07-14 NOTE — Progress Notes (Signed)
Follow-up U/S with Radiology 09/08/14 @ 915a.  VBAC consent reviewed with patient, obtained signature, given back to Dr. Loreta AveAcosta for review.  Spanish interpreter IrvonaBlanca present.

## 2014-07-14 NOTE — Progress Notes (Signed)
  Patient was seen on 07/14/14 for Gestational Diabetes self-management class at [facility]. The following learning objectives were met by the patient during this course:   States the definition of Gestational Diabetes  States when to check blood glucose levels  Demonstrates proper blood glucose monitoring techniques  Blood glucose monitor given: True Track Blood glucose reading: within target range  Patient instructed to monitor glucose levels: FBS: 60 - <90 2 hour: <120  *Patient received handouts:  Nutrition Diabetes and Pregnancy  Carbohydrate Counting List  Patient will be seen for follow-up as needed.   

## 2014-07-14 NOTE — Progress Notes (Signed)
Patient is 33 y.o. [id number] 8842w1d.  +FM, denies VB, contractions, vaginal discharge.  Notes some pain that radiates to left vagina and thigh intermittently. - discussed results of ultrasound, right pyelectasis and need for repeat at 32 weeks=> ordered - sign TOL consents today, hx of successful VBAC - did not check sugar, does not have glucometer => diabetic educator today, f/u 3 weeks to follow up glucose

## 2014-07-16 ENCOUNTER — Encounter: Payer: Self-pay | Admitting: Family Medicine

## 2014-08-04 ENCOUNTER — Ambulatory Visit (INDEPENDENT_AMBULATORY_CARE_PROVIDER_SITE_OTHER): Payer: Self-pay | Admitting: Obstetrics and Gynecology

## 2014-08-04 VITALS — BP 99/57 | HR 85 | Temp 98.2°F | Wt 192.2 lb

## 2014-08-04 DIAGNOSIS — Z3492 Encounter for supervision of normal pregnancy, unspecified, second trimester: Secondary | ICD-10-CM

## 2014-08-04 DIAGNOSIS — O24419 Gestational diabetes mellitus in pregnancy, unspecified control: Secondary | ICD-10-CM

## 2014-08-04 DIAGNOSIS — Z23 Encounter for immunization: Secondary | ICD-10-CM

## 2014-08-04 DIAGNOSIS — O24912 Unspecified diabetes mellitus in pregnancy, second trimester: Secondary | ICD-10-CM

## 2014-08-04 LAB — POCT URINALYSIS DIP (DEVICE)
Bilirubin Urine: NEGATIVE
Glucose, UA: NEGATIVE mg/dL
HGB URINE DIPSTICK: NEGATIVE
KETONES UR: NEGATIVE mg/dL
Nitrite: NEGATIVE
PH: 6.5 (ref 5.0–8.0)
PROTEIN: NEGATIVE mg/dL
Specific Gravity, Urine: 1.015 (ref 1.005–1.030)
Urobilinogen, UA: 1 mg/dL (ref 0.0–1.0)

## 2014-08-04 LAB — CBC
HCT: 34.8 % — ABNORMAL LOW (ref 36.0–46.0)
Hemoglobin: 11.6 g/dL — ABNORMAL LOW (ref 12.0–15.0)
MCH: 28.9 pg (ref 26.0–34.0)
MCHC: 33.3 g/dL (ref 30.0–36.0)
MCV: 86.6 fL (ref 78.0–100.0)
MPV: 10.4 fL (ref 9.4–12.4)
PLATELETS: 265 10*3/uL (ref 150–400)
RBC: 4.02 MIL/uL (ref 3.87–5.11)
RDW: 13.8 % (ref 11.5–15.5)
WBC: 7.8 10*3/uL (ref 4.0–10.5)

## 2014-08-04 LAB — HIV ANTIBODY (ROUTINE TESTING W REFLEX): HIV 1&2 Ab, 4th Generation: NONREACTIVE

## 2014-08-04 LAB — RPR

## 2014-08-04 MED ORDER — TETANUS-DIPHTH-ACELL PERTUSSIS 5-2.5-18.5 LF-MCG/0.5 IM SUSP
0.5000 mL | Freq: Once | INTRAMUSCULAR | Status: AC
  Administered 2014-08-04: 0.5 mL via INTRAMUSCULAR

## 2014-08-04 MED ORDER — GLYBURIDE 2.5 MG PO TABS: 2.5000 mg | ORAL_TABLET | Freq: Two times a day (BID) | ORAL | Status: DC

## 2014-08-04 NOTE — Progress Notes (Signed)
Patient is 33 y.o. [id number] 27627w1d. +FM, denies VB, contractions, vaginal discharge. Some back pain and pain in lower pelvis - discussed results of ultrasound, right pyelectasis and need for repeat at 32 weeks=> ordered for 12/28 - sign TOL consents 11/2, hx of successful VBAC  A1GDM - Fasting - 90, 94, 125, 113, 100, 104, 99, 96, 110, 122, 98. 99, 97, 112, 102. 104, 101. 108. 110. 89. 120, 80, 108 - 2 hr pp b fast- 120, 118, 110, 136, 215, 144, 198, 180, 81, 97, 144, 112, 120, 108, 99, 160, 120. 101. 115. 132. 126, 112 - 2 hr pp lunch - 120. 121. 110, 97, 111, 166, 161, 101, 99, 129. 80, 119, 110, 87, 115, 119. 126, 134, 125, 114, 156, 136,  - 2 hr pp dinner - 120. 159. 113. 138. 105, 124, 89, 167, 135, 104, 97, 148, 173, 126, 139, 85, 149. 125, 114, 157, 119, 92 - > 50% above goal --> start glyburide 2.5 mg BID - did not check sugar, does not have glucometer => diabetic educator today, f/u 3 weeks to follow up glucose

## 2014-08-04 NOTE — Progress Notes (Signed)
Kara Dominguez Spanish Interpreter for encounter. 28 wk labs with Tdap

## 2014-08-13 ENCOUNTER — Other Ambulatory Visit: Payer: Self-pay

## 2014-08-13 DIAGNOSIS — O24912 Unspecified diabetes mellitus in pregnancy, second trimester: Secondary | ICD-10-CM

## 2014-08-13 MED ORDER — GLYBURIDE 2.5 MG PO TABS: 2.5000 mg | ORAL_TABLET | Freq: Two times a day (BID) | ORAL | Status: DC

## 2014-08-13 NOTE — Telephone Encounter (Signed)
Pt called the front desk and informed me [facility] and was only given the machine.  When I asked what they told her concerning her medication she stated that [facility] informed her to go to [facility] to pick up glyburide because it would be cheaper.  I informed pt that [facility] would have it cheaper.  Pt informed me that the [facility] [address] [facility].  I advised to give them at least an hour to pick up medication.  [facility] [place] and he informed me that because pt is taking 60 tablets that her cost would be $8.

## 2014-08-18 ENCOUNTER — Ambulatory Visit (INDEPENDENT_AMBULATORY_CARE_PROVIDER_SITE_OTHER): Payer: Self-pay | Admitting: Obstetrics and Gynecology

## 2014-08-18 VITALS — BP 106/65 | HR 88 | Temp 97.3°F | Wt 193.1 lb

## 2014-08-18 DIAGNOSIS — O24912 Unspecified diabetes mellitus in pregnancy, second trimester: Secondary | ICD-10-CM

## 2014-08-18 DIAGNOSIS — O0993 Supervision of high risk pregnancy, unspecified, third trimester: Secondary | ICD-10-CM

## 2014-08-18 DIAGNOSIS — O24414 Gestational diabetes mellitus in pregnancy, insulin controlled: Secondary | ICD-10-CM

## 2014-08-18 LAB — POCT URINALYSIS DIP (DEVICE)
Bilirubin Urine: NEGATIVE
Glucose, UA: NEGATIVE mg/dL
Ketones, ur: NEGATIVE mg/dL
Nitrite: NEGATIVE
Protein, ur: NEGATIVE mg/dL
Specific Gravity, Urine: 1.025 (ref 1.005–1.030)
UROBILINOGEN UA: 0.2 mg/dL (ref 0.0–1.0)
pH: 5.5 (ref 5.0–8.0)

## 2014-08-18 MED ORDER — GLYBURIDE 2.5 MG PO TABS: ORAL_TABLET | ORAL | Status: DC

## 2014-08-18 MED ORDER — PROGESTERONE MICRONIZED 200 MG PO CAPS: 200.0000 mg | ORAL_CAPSULE | Freq: Every day | ORAL | Status: DC

## 2014-08-18 NOTE — Progress Notes (Signed)
33 y.o. [id number] at 2134w1d. Doing well today. No concerns or complaints. 1. Routine PNC. Lab work reviewed. FM/PTL precautions reviewed. 2. A2GDM. Reviewed blood glucose values today. Continues to take glyburide 2.5 mg BID, started last week. AM fasting glucose values mostly at goal, although >50% postprandial blood sugars >120. Increase glyburide 5 mg in the morning, continue 2.5 mg at night. Rx sent to pharmacy. RTC in 2 weeks.

## 2014-08-18 NOTE — Progress Notes (Signed)
Interputer Kara Dominguez

## 2014-08-28 ENCOUNTER — Encounter: Payer: Self-pay | Admitting: *Deleted

## 2014-08-28 DIAGNOSIS — O0993 Supervision of high risk pregnancy, unspecified, third trimester: Secondary | ICD-10-CM

## 2014-09-01 ENCOUNTER — Ambulatory Visit (INDEPENDENT_AMBULATORY_CARE_PROVIDER_SITE_OTHER): Payer: Self-pay | Admitting: Obstetrics and Gynecology

## 2014-09-01 VITALS — BP 102/57 | HR 90 | Temp 97.9°F | Wt 195.8 lb

## 2014-09-01 DIAGNOSIS — O24419 Gestational diabetes mellitus in pregnancy, unspecified control: Secondary | ICD-10-CM

## 2014-09-01 DIAGNOSIS — O0993 Supervision of high risk pregnancy, unspecified, third trimester: Secondary | ICD-10-CM

## 2014-09-01 DIAGNOSIS — O24912 Unspecified diabetes mellitus in pregnancy, second trimester: Secondary | ICD-10-CM

## 2014-09-01 LAB — POCT URINALYSIS DIP (DEVICE)
BILIRUBIN URINE: NEGATIVE
Glucose, UA: NEGATIVE mg/dL
Ketones, ur: NEGATIVE mg/dL
NITRITE: NEGATIVE
PH: 6 (ref 5.0–8.0)
Protein, ur: NEGATIVE mg/dL
SPECIFIC GRAVITY, URINE: 1.02 (ref 1.005–1.030)
Urobilinogen, UA: 0.2 mg/dL (ref 0.0–1.0)

## 2014-09-01 MED ORDER — GLYBURIDE 2.5 MG PO TABS: ORAL_TABLET | ORAL | Status: DC

## 2014-09-01 NOTE — Progress Notes (Signed)
33 y.o. [id number] at 5851w1d. Doing well today. No concerns or complaints.  1. A2GDM. Reviewed blood glucose log today. Only ~50% of blood sugars at goal. Increase glyburide 7.5 mg in the morning 5 mg at night. Counseled on diet/exercise. RTC in 1 week for visit and NST. Growth scan scheduled for next week.  2. Routine PNC. FM/PTL precautions reviewed.

## 2014-09-01 NOTE — Progress Notes (Signed)
Used alis for interpreter

## 2014-09-08 ENCOUNTER — Ambulatory Visit (HOSPITAL_COMMUNITY)
Admission: RE | Admit: 2014-09-08 | Discharge: 2014-09-08 | Disposition: A | Discharge status: Home / Self Care | Payer: Self-pay | Source: Ambulatory Visit | Attending: Family Medicine | Admitting: Family Medicine

## 2014-09-08 DIAGNOSIS — O09529 Supervision of elderly multigravida, unspecified trimester: Secondary | ICD-10-CM | POA: Insufficient documentation

## 2014-09-08 DIAGNOSIS — O0992 Supervision of high risk pregnancy, unspecified, second trimester: Secondary | ICD-10-CM

## 2014-09-08 DIAGNOSIS — O09293 Supervision of pregnancy with other poor reproductive or obstetric history, third trimester: Secondary | ICD-10-CM | POA: Insufficient documentation

## 2014-09-08 DIAGNOSIS — O3421 Maternal care for scar from previous cesarean delivery: Secondary | ICD-10-CM | POA: Insufficient documentation

## 2014-09-08 DIAGNOSIS — O35EXX0 Maternal care for other (suspected) fetal abnormality and damage, fetal genitourinary anomalies, not applicable or unspecified: Secondary | ICD-10-CM | POA: Insufficient documentation

## 2014-09-08 DIAGNOSIS — Z3A32 32 weeks gestation of pregnancy: Secondary | ICD-10-CM | POA: Insufficient documentation

## 2014-09-08 DIAGNOSIS — O358XX0 Maternal care for other (suspected) fetal abnormality and damage, not applicable or unspecified: Secondary | ICD-10-CM | POA: Insufficient documentation

## 2014-09-12 NOTE — L&D Delivery Note (Signed)
Patient is [age] y.o. [id number] 6473w0d admitted for induction trial of labor, hx of A2DM on glyburide (initially uncontrolled), previous cesarean section followed by a VBAC preterm   Delivery Note At 3:29 PM a viable female was delivered via Vaginal, Spontaneous Delivery (Presentation: Right Occiput Anterior).  APGAR: 7, 9; weight  .   Placenta status: Intact, Spontaneous.  Cord: 3 vessels with the following complications: None.  Attempt at drawing cord gas failed.  Dr. Emelda FearFerguson present for delivery in it's entirety, aided in delivery of shoulders  Dystocia: 30-45seconds, Mcrobert's, suprapubic pressure, posterior shoulder delivered first (left) before (right) anterior shoulder which appeared to have decreased tone after delivery  Anesthesia: None  Episiotomy: None Lacerations: 2nd degree Suture Repair: 3.0 vicryl rapide Est. Blood Loss (mL): 450  Mom to postpartum.  Baby to Couplet care / Skin to Skin.  Kara Dominguez 10/26/2014, 5:02 PM

## 2014-09-15 ENCOUNTER — Encounter: Payer: Self-pay | Admitting: Obstetrics & Gynecology

## 2014-09-15 ENCOUNTER — Ambulatory Visit (INDEPENDENT_AMBULATORY_CARE_PROVIDER_SITE_OTHER): Payer: Self-pay | Admitting: Obstetrics & Gynecology

## 2014-09-15 VITALS — BP 110/58 | HR 85 | Temp 97.7°F | Wt 199.0 lb

## 2014-09-15 DIAGNOSIS — O24419 Gestational diabetes mellitus in pregnancy, unspecified control: Secondary | ICD-10-CM

## 2014-09-15 DIAGNOSIS — O0993 Supervision of high risk pregnancy, unspecified, third trimester: Secondary | ICD-10-CM

## 2014-09-15 LAB — POCT URINALYSIS DIP (DEVICE)
BILIRUBIN URINE: NEGATIVE
Glucose, UA: NEGATIVE mg/dL
KETONES UR: NEGATIVE mg/dL
Nitrite: NEGATIVE
PH: 5.5 (ref 5.0–8.0)
PROTEIN: NEGATIVE mg/dL
SPECIFIC GRAVITY, URINE: 1.025 (ref 1.005–1.030)
Urobilinogen, UA: 0.2 mg/dL (ref 0.0–1.0)

## 2014-09-15 NOTE — Progress Notes (Signed)
Needs to stat NST 2/week. 83 % ile growth 09/08/14 [place]. Nl AFI. FBS nl, most PP nl, continue her glyburide

## 2014-09-15 NOTE — Patient Instructions (Signed)
Tercer trimestre de embarazo (Third Trimester of Pregnancy) El tercer trimestre va desde la semana29 hasta la 42, desde el sptimo hasta el noveno mes, y es la poca en la que el feto crece ms rpidamente. Hacia el final del noveno mes, el feto mide alrededor de 20pulgadas (45cm) de largo y pesa entre 6 y 10 libras (2,700 y 4,500kg).  CAMBIOS EN EL ORGANISMO Su organismo atraviesa por muchos cambios durante el embarazo, y estos varan de una mujer a otra.   Seguir aumentando de peso. Es de esperar que aumente entre 25 y 35libras (11 y 16kg) hacia el final del embarazo.  Podrn aparecer las primeras estras en las caderas, el abdomen y las mamas.  Puede tener necesidad de orinar con ms frecuencia porque el feto baja hacia la pelvis y ejerce presin sobre la vejiga.  Debido al embarazo podr sentir acidez estomacal con frecuencia.  Puede estar estreida, ya que ciertas hormonas enlentecen los movimientos de los msculos que empujan los desechos a travs de los intestinos.  Pueden aparecer hemorroides o abultarse e hincharse las venas (venas varicosas).  Puede sentir dolor plvico debido al aumento de peso y a que las hormonas del embarazo relajan las articulaciones entre los huesos de la pelvis. El dolor de espalda puede ser consecuencia de la sobrecarga de los msculos que soportan la postura.  Tal vez haya cambios en el cabello que pueden incluir su engrosamiento, crecimiento rpido y cambios en la textura. Adems, a algunas mujeres se les cae el cabello durante o despus del embarazo, o tienen el cabello seco o fino. Lo ms probable es que el cabello se le normalice despus del nacimiento del beb.  Las mamas seguirn creciendo y le dolern. A veces, puede haber una secrecin amarilla de las mamas llamada calostro.  El ombligo puede salir hacia afuera.  Puede sentir que le falta el aire debido a que se expande el tero.  Puede notar que el feto "baja" o lo siente ms bajo, en el  abdomen.  Puede tener una prdida de secrecin mucosa con sangre. Esto suele ocurrir en el trmino de unos pocos das a una semana antes de que comience el trabajo de parto.  El cuello del tero se vuelve delgado y blando (se borra) cerca de la fecha de parto. QU DEBE ESPERAR EN LOS EXMENES PRENATALES  Le harn exmenes prenatales cada 2semanas hasta la semana36. A partir de ese momento le harn exmenes semanales. Durante una visita prenatal de rutina:  La pesarn para asegurarse de que usted y el feto estn creciendo normalmente.  Le tomarn la presin arterial.  Le medirn el abdomen para controlar el desarrollo del beb.  Se escucharn los latidos cardacos fetales.  Se evaluarn los resultados de los estudios solicitados en visitas anteriores.  Le revisarn el cuello del tero cuando est prxima la fecha de parto para controlar si este se ha borrado. Alrededor de la semana36, el mdico le revisar el cuello del tero. Al mismo tiempo, realizar un anlisis de las secreciones del tejido vaginal. Este examen es para determinar si hay un tipo de bacteria, estreptococo Grupo B. El mdico le explicar esto con ms detalle. El mdico puede preguntarle lo siguiente:  Cmo le gustara que fuera el parto.  Cmo se siente.  Si siente los movimientos del beb.  Si ha tenido sntomas anormales, como prdida de lquido, sangrado, dolores de cabeza intensos o clicos abdominales.  Si tiene alguna pregunta. Otros exmenes o estudios de deteccin que pueden realizarse   durante el tercer trimestre incluyen lo siguiente:  Anlisis de sangre para controlar las concentraciones de hierro (anemia).  Controles fetales para determinar su salud, nivel de actividad y crecimiento. Si tiene alguna enfermedad o hay problemas durante el embarazo, le harn estudios. FALSO TRABAJO DE PARTO Es posible que sienta contracciones leves e irregulares que finalmente desaparecen. Se llaman contracciones de  Braxton Hicks o falso trabajo de parto. Las contracciones pueden durar horas, das o incluso semanas, antes de que el verdadero trabajo de parto se inicie. Si las contracciones ocurren a intervalos regulares, se intensifican o se hacen dolorosas, lo mejor es que la revise el mdico.  SIGNOS DE TRABAJO DE PARTO   Clicos de tipo menstrual.  Contracciones cada 5minutos o menos.  Contracciones que comienzan en la parte superior del tero y se extienden hacia abajo, a la zona inferior del abdomen y la espalda.  Sensacin de mayor presin en la pelvis o dolor de espalda.  Una secrecin de mucosidad acuosa o con sangre que sale de la vagina. Si tiene alguno de estos signos antes de la semana37 del embarazo, llame a su mdico de inmediato. Debe concurrir al hospital para que la controlen inmediatamente. INSTRUCCIONES PARA EL CUIDADO EN EL HOGAR   Evite fumar, consumir hierbas, beber alcohol y tomar frmacos que no le hayan recetado. Estas sustancias qumicas afectan la formacin y el desarrollo del beb.  Siga las indicaciones del mdico en relacin con el uso de medicamentos. Durante el embarazo, hay medicamentos que son seguros de tomar y otros que no.  Haga actividad fsica solo en la forma indicada por el mdico. Sentir clicos uterinos es un buen signo para detener la actividad fsica.  Contine comiendo alimentos que sanos con regularidad.  Use un sostn que le brinde buen soporte si le duelen las mamas.  No se d baos de inmersin en agua caliente, baos turcos ni saunas.  Colquese el cinturn de seguridad cuando conduzca.  No coma carne cruda ni queso sin cocinar; evite el contacto con las bandejas sanitarias de los gatos y la tierra que estos animales usan. Estos elementos contienen grmenes que pueden causar defectos congnitos en el beb.  Tome las vitaminas prenatales.  Si est estreida, pruebe un laxante suave (si el mdico lo autoriza). Consuma ms alimentos ricos en  fibra, como vegetales y frutas frescos y cereales integrales. Beba gran cantidad de lquido para mantener la orina de tono claro o color amarillo plido.  Dese baos de asiento con agua tibia para aliviar el dolor o las molestias causadas por las hemorroides. Use una crema para las hemorroides si el mdico la autoriza.  Si tiene venas varicosas, use medias de descanso. Eleve los pies durante 15minutos, 3 o 4veces por da. Limite la cantidad de sal en su dieta.  Evite levantar objetos pesados, use zapatos de tacones bajos y mantenga una buena postura.  Descanse con las piernas elevadas si tiene calambres o dolor de cintura.  Visite a su dentista si no lo ha hecho durante el embarazo. Use un cepillo de dientes blando para higienizarse los dientes y psese el hilo dental con suavidad.  Puede seguir manteniendo relaciones sexuales, a menos que el mdico le indique lo contrario.  No haga viajes largos excepto que sea absolutamente necesario y solo con la autorizacin del mdico.  Tome clases prenatales para entender, practicar y hacer preguntas sobre el trabajo de parto y el parto.  Haga un ensayo de la partida al hospital.  Prepare el bolso que   llevar al hospital.  Prepare la habitacin del beb.  Concurra a todas las visitas prenatales segn las indicaciones de su mdico. SOLICITE ATENCIN MDICA SI:  No est segura de que est en trabajo de parto o de que ha roto la bolsa de las aguas.  Tiene mareos.  Siente clicos leves, presin en la pelvis o dolor persistente en el abdomen.  Tiene nuseas, vmitos o diarrea persistentes.  Tiene secrecin vaginal con mal olor.  Siente dolor al orinar. SOLICITE ATENCIN MDICA DE INMEDIATO SI:   Tiene fiebre.  Tiene una prdida de lquido por la vagina.  Tiene sangrado o pequeas prdidas vaginales.  Siente dolor intenso o clicos en el abdomen.  Sube o baja de peso rpidamente.  Tiene dificultad para respirar y siente dolor de  pecho.  Sbitamente se le hinchan mucho el rostro, las manos, los tobillos, los pies o las piernas.  No ha sentido los movimientos del beb durante una hora.  Siente un dolor de cabeza intenso que no se alivia con medicamentos.  Hay cambios en la visin. Document Released: 06/08/2005 Document Revised: 09/03/2013 ExitCare Patient Information 2015 [employer]. This information is not intended to replace advice given to you by your health care provider. Make sure you discuss any questions you have with your health care provider.  

## 2014-09-18 ENCOUNTER — Ambulatory Visit (INDEPENDENT_AMBULATORY_CARE_PROVIDER_SITE_OTHER): Payer: Self-pay | Admitting: *Deleted

## 2014-09-18 VITALS — BP 104/56 | HR 91

## 2014-09-18 DIAGNOSIS — O24419 Gestational diabetes mellitus in pregnancy, unspecified control: Secondary | ICD-10-CM

## 2014-09-18 LAB — US OB FOLLOW UP

## 2014-09-18 NOTE — Progress Notes (Signed)
Interpreter Blanca Lindner present for encounter 

## 2014-09-18 NOTE — Progress Notes (Signed)
1/7 NST reviewed and reactive 

## 2014-09-22 ENCOUNTER — Ambulatory Visit (INDEPENDENT_AMBULATORY_CARE_PROVIDER_SITE_OTHER): Payer: Self-pay | Admitting: Obstetrics & Gynecology

## 2014-09-22 VITALS — BP 116/67 | HR 101 | Wt 197.7 lb

## 2014-09-22 DIAGNOSIS — O24912 Unspecified diabetes mellitus in pregnancy, second trimester: Secondary | ICD-10-CM

## 2014-09-22 DIAGNOSIS — O24419 Gestational diabetes mellitus in pregnancy, unspecified control: Secondary | ICD-10-CM

## 2014-09-22 DIAGNOSIS — O24415 Gestational diabetes mellitus in pregnancy, controlled by oral hypoglycemic drugs: Secondary | ICD-10-CM

## 2014-09-22 DIAGNOSIS — O0993 Supervision of high risk pregnancy, unspecified, third trimester: Secondary | ICD-10-CM

## 2014-09-22 MED ORDER — GLYBURIDE 5 MG PO TABS: ORAL_TABLET | ORAL | Status: DC

## 2014-09-22 NOTE — Progress Notes (Signed)
NST/OBF  Kara Dominguez present as Research officer, trade unionpanish Interpreter for [employer] CompanyEncounter.

## 2014-09-22 NOTE — Progress Notes (Signed)
Pyelectasis still present.  Peds to be notified at birth.  NST reactive. Fasting 69-108 with most being within range; pp 68, 140, 89, 63,140,122,116,150,108,89;   Pp lunch 102,112,78,139,130,136,139,140, 58,105;  Pp dinner 135,130,60,143,140,141,73,125,105,119 Increase am glyburide to 10 mg.  Pt understands.  Will continue 5 mg qhs. Pt not taking 17P (too late to start) Patient c/o LBP (low musculoskeletal).  Try pregnancy belt and tylenol and heating pack.

## 2014-09-24 LAB — POCT URINALYSIS DIP (DEVICE)
Bilirubin Urine: NEGATIVE
Glucose, UA: NEGATIVE mg/dL
KETONES UR: NEGATIVE mg/dL
Nitrite: NEGATIVE
PROTEIN: NEGATIVE mg/dL
SPECIFIC GRAVITY, URINE: 1.025 (ref 1.005–1.030)
Urobilinogen, UA: 0.2 mg/dL (ref 0.0–1.0)
pH: 6 (ref 5.0–8.0)

## 2014-09-26 ENCOUNTER — Ambulatory Visit (INDEPENDENT_AMBULATORY_CARE_PROVIDER_SITE_OTHER): Payer: Self-pay | Admitting: *Deleted

## 2014-09-26 DIAGNOSIS — O24419 Gestational diabetes mellitus in pregnancy, unspecified control: Secondary | ICD-10-CM

## 2014-09-26 LAB — US OB FOLLOW UP

## 2014-09-26 NOTE — Progress Notes (Signed)
NST reactive.

## 2014-09-29 ENCOUNTER — Encounter: Payer: Self-pay | Admitting: Obstetrics and Gynecology

## 2014-09-29 ENCOUNTER — Ambulatory Visit (INDEPENDENT_AMBULATORY_CARE_PROVIDER_SITE_OTHER): Payer: Self-pay | Admitting: Obstetrics and Gynecology

## 2014-09-29 VITALS — BP 107/66 | HR 86 | Wt 199.6 lb

## 2014-09-29 DIAGNOSIS — O09523 Supervision of elderly multigravida, third trimester: Secondary | ICD-10-CM

## 2014-09-29 DIAGNOSIS — O0933 Supervision of pregnancy with insufficient antenatal care, third trimester: Secondary | ICD-10-CM

## 2014-09-29 DIAGNOSIS — O24419 Gestational diabetes mellitus in pregnancy, unspecified control: Secondary | ICD-10-CM

## 2014-09-29 DIAGNOSIS — O0993 Supervision of high risk pregnancy, unspecified, third trimester: Secondary | ICD-10-CM

## 2014-09-29 LAB — POCT URINALYSIS DIP (DEVICE)
BILIRUBIN URINE: NEGATIVE
Glucose, UA: NEGATIVE mg/dL
KETONES UR: NEGATIVE mg/dL
Nitrite: NEGATIVE
Protein, ur: NEGATIVE mg/dL
Specific Gravity, Urine: >=1.03 (ref 1.005–1.030)
Urobilinogen, UA: 0.2 mg/dL (ref 0.0–1.0)
pH: 5.5 (ref 5.0–8.0)

## 2014-09-29 NOTE — Progress Notes (Signed)
Patient is doing well without complaints. FM/PTL precautions reviewed CBGs reviewed and well controlled- continue glyburide Growth ultrasound ordered in february

## 2014-09-29 NOTE — Progress Notes (Signed)
Interpreter Carol Hernandez present for encounter.   

## 2014-10-02 ENCOUNTER — Telehealth: Payer: Self-pay | Admitting: *Deleted

## 2014-10-02 NOTE — Telephone Encounter (Signed)
[employer] Interpreter ID# [phone]  Contacted patient, Pt is not at home but her husband is able to take/relay message.  Informed spouse that the clinic will be closed on 1/22 due to inclement weather.  Reviewed signs/symptoms for patient to come to [facility].  We will see patient at her next scheduled appointment.  Spouse verbalizes understanding.

## 2014-10-03 ENCOUNTER — Other Ambulatory Visit: Payer: Self-pay

## 2014-10-06 ENCOUNTER — Other Ambulatory Visit: Payer: Self-pay | Admitting: Obstetrics & Gynecology

## 2014-10-06 ENCOUNTER — Ambulatory Visit (INDEPENDENT_AMBULATORY_CARE_PROVIDER_SITE_OTHER): Payer: Self-pay | Admitting: Obstetrics & Gynecology

## 2014-10-06 VITALS — BP 114/61 | HR 90 | Temp 97.6°F | Wt 199.8 lb

## 2014-10-06 DIAGNOSIS — Z8751 Personal history of pre-term labor: Secondary | ICD-10-CM

## 2014-10-06 DIAGNOSIS — O24419 Gestational diabetes mellitus in pregnancy, unspecified control: Secondary | ICD-10-CM

## 2014-10-06 DIAGNOSIS — O0993 Supervision of high risk pregnancy, unspecified, third trimester: Secondary | ICD-10-CM

## 2014-10-06 LAB — POCT URINALYSIS DIP (DEVICE)
BILIRUBIN URINE: NEGATIVE
GLUCOSE, UA: NEGATIVE mg/dL
HGB URINE DIPSTICK: NEGATIVE
Ketones, ur: NEGATIVE mg/dL
Nitrite: NEGATIVE
PH: 6 (ref 5.0–8.0)
Protein, ur: NEGATIVE mg/dL
Specific Gravity, Urine: 1.025 (ref 1.005–1.030)
UROBILINOGEN UA: 0.2 mg/dL (ref 0.0–1.0)

## 2014-10-06 LAB — US OB FOLLOW UP

## 2014-10-06 LAB — OB RESULTS CONSOLE GBS: STREP GROUP B AG: NEGATIVE

## 2014-10-06 LAB — OB RESULTS CONSOLE GC/CHLAMYDIA
Chlamydia: NEGATIVE
Gonorrhea: NEGATIVE

## 2014-10-06 NOTE — Progress Notes (Signed)
Kara Dominguez used for interpreter; patient reports pelvic pain and lower back pain

## 2014-10-06 NOTE — Patient Instructions (Signed)
Return to clinic for any obstetric concerns or go to [facility] for evaluation  

## 2014-10-06 NOTE — Progress Notes (Signed)
Patient is Spanish-speaking only, Spanish interpreter present for this encounter. Blood sugars within range, continue Glyburide. Pelvic cultures today. NST performed today was reviewed and was found to be reactive.  AFI normal at 11.6 cm.  Continue recommended antenatal testing and prenatal care. No other complaints or concerns.  Labor and fetal movement precautions reviewed.

## 2014-10-07 LAB — GC/CHLAMYDIA PROBE AMP
CT Probe RNA: NEGATIVE
GC Probe RNA: NEGATIVE

## 2014-10-09 ENCOUNTER — Ambulatory Visit (INDEPENDENT_AMBULATORY_CARE_PROVIDER_SITE_OTHER): Payer: Self-pay | Admitting: *Deleted

## 2014-10-09 VITALS — BP 100/62 | HR 92 | Wt 199.9 lb

## 2014-10-09 DIAGNOSIS — O24419 Gestational diabetes mellitus in pregnancy, unspecified control: Secondary | ICD-10-CM

## 2014-10-09 LAB — CULTURE, BETA STREP (GROUP B ONLY)

## 2014-10-09 NOTE — Progress Notes (Signed)
Here for NST. Used interpreter Abel PrestoAdriana Zavala- Bradly BienenstockMartinez.

## 2014-10-09 NOTE — Progress Notes (Signed)
NST performed today was reviewed and was found to be reactive.  Continue recommended antenatal testing and prenatal care.  

## 2014-10-13 ENCOUNTER — Encounter: Payer: Self-pay | Admitting: Obstetrics & Gynecology

## 2014-10-13 ENCOUNTER — Ambulatory Visit (INDEPENDENT_AMBULATORY_CARE_PROVIDER_SITE_OTHER): Payer: Self-pay | Admitting: Family Medicine

## 2014-10-13 VITALS — BP 102/62 | HR 91 | Temp 98.1°F | Wt 202.4 lb

## 2014-10-13 DIAGNOSIS — O24912 Unspecified diabetes mellitus in pregnancy, second trimester: Secondary | ICD-10-CM

## 2014-10-13 DIAGNOSIS — O24419 Gestational diabetes mellitus in pregnancy, unspecified control: Secondary | ICD-10-CM

## 2014-10-13 DIAGNOSIS — O0993 Supervision of high risk pregnancy, unspecified, third trimester: Secondary | ICD-10-CM

## 2014-10-13 LAB — POCT URINALYSIS DIP (DEVICE)
Bilirubin Urine: NEGATIVE
GLUCOSE, UA: NEGATIVE mg/dL
KETONES UR: NEGATIVE mg/dL
NITRITE: NEGATIVE
PH: 6.5 (ref 5.0–8.0)
PROTEIN: NEGATIVE mg/dL
Specific Gravity, Urine: 1.025 (ref 1.005–1.030)
Urobilinogen, UA: 0.2 mg/dL (ref 0.0–1.0)

## 2014-10-13 LAB — US OB FOLLOW UP

## 2014-10-13 MED ORDER — GLYBURIDE 5 MG PO TABS: ORAL_TABLET | ORAL | Status: DC

## 2014-10-13 NOTE — Progress Notes (Signed)
C/o of occasional, mild contractions, especially at night.  Kara Dominguez used as interpreter for this encounter.

## 2014-10-13 NOTE — Patient Instructions (Signed)
Tercer trimestre de embarazo (Third Trimester of Pregnancy) El tercer trimestre va desde la semana29 hasta la 42, desde el sptimo hasta el noveno mes, y es la poca en la que el feto crece ms rpidamente. Hacia el final del noveno mes, el feto mide alrededor de 20pulgadas (45cm) de largo y pesa entre 6 y 10 libras (2,700 y 4,500kg).  CAMBIOS EN EL ORGANISMO Su organismo atraviesa por muchos cambios durante el embarazo, y estos varan de una mujer a otra.   Seguir aumentando de peso. Es de esperar que aumente entre 25 y 35libras (11 y 16kg) hacia el final del embarazo.  Podrn aparecer las primeras estras en las caderas, el abdomen y las mamas.  Puede tener necesidad de orinar con ms frecuencia porque el feto baja hacia la pelvis y ejerce presin sobre la vejiga.  Debido al embarazo podr sentir acidez estomacal con frecuencia.  Puede estar estreida, ya que ciertas hormonas enlentecen los movimientos de los msculos que empujan los desechos a travs de los intestinos.  Pueden aparecer hemorroides o abultarse e hincharse las venas (venas varicosas).  Puede sentir dolor plvico debido al aumento de peso y a que las hormonas del embarazo relajan las articulaciones entre los huesos de la pelvis. El dolor de espalda puede ser consecuencia de la sobrecarga de los msculos que soportan la postura.  Tal vez haya cambios en el cabello que pueden incluir su engrosamiento, crecimiento rpido y cambios en la textura. Adems, a algunas mujeres se les cae el cabello durante o despus del embarazo, o tienen el cabello seco o fino. Lo ms probable es que el cabello se le normalice despus del nacimiento del beb.  Las mamas seguirn creciendo y le dolern. A veces, puede haber una secrecin amarilla de las mamas llamada calostro.  El ombligo puede salir hacia afuera.  Puede sentir que le falta el aire debido a que se expande el tero.  Puede notar que el feto "baja" o lo siente ms bajo, en el  abdomen.  Puede tener una prdida de secrecin mucosa con sangre. Esto suele ocurrir en el trmino de unos pocos das a una semana antes de que comience el trabajo de parto.  El cuello del tero se vuelve delgado y blando (se borra) cerca de la fecha de parto. QU DEBE ESPERAR EN LOS EXMENES PRENATALES  Le harn exmenes prenatales cada 2semanas hasta la semana36. A partir de ese momento le harn exmenes semanales. Durante una visita prenatal de rutina:  La pesarn para asegurarse de que usted y el feto estn creciendo normalmente.  Le tomarn la presin arterial.  Le medirn el abdomen para controlar el desarrollo del beb.  Se escucharn los latidos cardacos fetales.  Se evaluarn los resultados de los estudios solicitados en visitas anteriores.  Le revisarn el cuello del tero cuando est prxima la fecha de parto para controlar si este se ha borrado. Alrededor de la semana36, el mdico le revisar el cuello del tero. Al mismo tiempo, realizar un anlisis de las secreciones del tejido vaginal. Este examen es para determinar si hay un tipo de bacteria, estreptococo Grupo B. El mdico le explicar esto con ms detalle. El mdico puede preguntarle lo siguiente:  Cmo le gustara que fuera el parto.  Cmo se siente.  Si siente los movimientos del beb.  Si ha tenido sntomas anormales, como prdida de lquido, sangrado, dolores de cabeza intensos o clicos abdominales.  Si tiene alguna pregunta. Otros exmenes o estudios de deteccin que pueden realizarse   durante el tercer trimestre incluyen lo siguiente:  Anlisis de sangre para controlar las concentraciones de hierro (anemia).  Controles fetales para determinar su salud, nivel de actividad y crecimiento. Si tiene alguna enfermedad o hay problemas durante el embarazo, le harn estudios. FALSO TRABAJO DE PARTO Es posible que sienta contracciones leves e irregulares que finalmente desaparecen. Se llaman contracciones de  Braxton Hicks o falso trabajo de parto. Las contracciones pueden durar horas, das o incluso semanas, antes de que el verdadero trabajo de parto se inicie. Si las contracciones ocurren a intervalos regulares, se intensifican o se hacen dolorosas, lo mejor es que la revise el mdico.  SIGNOS DE TRABAJO DE PARTO   Clicos de tipo menstrual.  Contracciones cada 5minutos o menos.  Contracciones que comienzan en la parte superior del tero y se extienden hacia abajo, a la zona inferior del abdomen y la espalda.  Sensacin de mayor presin en la pelvis o dolor de espalda.  Una secrecin de mucosidad acuosa o con sangre que sale de la vagina. Si tiene alguno de estos signos antes de la semana37 del embarazo, llame a su mdico de inmediato. Debe concurrir al hospital para que la controlen inmediatamente. INSTRUCCIONES PARA EL CUIDADO EN EL HOGAR   Evite fumar, consumir hierbas, beber alcohol y tomar frmacos que no le hayan recetado. Estas sustancias qumicas afectan la formacin y el desarrollo del beb.  Siga las indicaciones del mdico en relacin con el uso de medicamentos. Durante el embarazo, hay medicamentos que son seguros de tomar y otros que no.  Haga actividad fsica solo en la forma indicada por el mdico. Sentir clicos uterinos es un buen signo para detener la actividad fsica.  Contine comiendo alimentos que sanos con regularidad.  Use un sostn que le brinde buen soporte si le duelen las mamas.  No se d baos de inmersin en agua caliente, baos turcos ni saunas.  Colquese el cinturn de seguridad cuando conduzca.  No coma carne cruda ni queso sin cocinar; evite el contacto con las bandejas sanitarias de los gatos y la tierra que estos animales usan. Estos elementos contienen grmenes que pueden causar defectos congnitos en el beb.  Tome las vitaminas prenatales.  Si est estreida, pruebe un laxante suave (si el mdico lo autoriza). Consuma ms alimentos ricos en  fibra, como vegetales y frutas frescos y cereales integrales. Beba gran cantidad de lquido para mantener la orina de tono claro o color amarillo plido.  Dese baos de asiento con agua tibia para aliviar el dolor o las molestias causadas por las hemorroides. Use una crema para las hemorroides si el mdico la autoriza.  Si tiene venas varicosas, use medias de descanso. Eleve los pies durante 15minutos, 3 o 4veces por da. Limite la cantidad de sal en su dieta.  Evite levantar objetos pesados, use zapatos de tacones bajos y mantenga una buena postura.  Descanse con las piernas elevadas si tiene calambres o dolor de cintura.  Visite a su dentista si no lo ha hecho durante el embarazo. Use un cepillo de dientes blando para higienizarse los dientes y psese el hilo dental con suavidad.  Puede seguir manteniendo relaciones sexuales, a menos que el mdico le indique lo contrario.  No haga viajes largos excepto que sea absolutamente necesario y solo con la autorizacin del mdico.  Tome clases prenatales para entender, practicar y hacer preguntas sobre el trabajo de parto y el parto.  Haga un ensayo de la partida al hospital.  Prepare el bolso que   llevar al hospital.  Prepare la habitacin del beb.  Concurra a todas las visitas prenatales segn las indicaciones de su mdico. SOLICITE ATENCIN MDICA SI:  No est segura de que est en trabajo de parto o de que ha roto la bolsa de las aguas.  Tiene mareos.  Siente clicos leves, presin en la pelvis o dolor persistente en el abdomen.  Tiene nuseas, vmitos o diarrea persistentes.  Tiene secrecin vaginal con mal olor.  Siente dolor al orinar. SOLICITE ATENCIN MDICA DE INMEDIATO SI:   Tiene fiebre.  Tiene una prdida de lquido por la vagina.  Tiene sangrado o pequeas prdidas vaginales.  Siente dolor intenso o clicos en el abdomen.  Sube o baja de peso rpidamente.  Tiene dificultad para respirar y siente dolor de  pecho.  Sbitamente se le hinchan mucho el rostro, las manos, los tobillos, los pies o las piernas.  No ha sentido los movimientos del beb durante una hora.  Siente un dolor de cabeza intenso que no se alivia con medicamentos.  Hay cambios en la visin. Document Released: 06/08/2005 Document Revised: 09/03/2013 ExitCare Patient Information 2015 [employer]. This information is not intended to replace advice given to you by your health care provider. Make sure you discuss any questions you have with your health care provider.  

## 2014-10-13 NOTE — Progress Notes (Signed)
Fasting blood sugar controlled PP - 12 elevated.  Increase morning gliburide to 7.5mg  Induce at 39 weeks. Fetal movement and labor precautions given. Reactive NST

## 2014-10-16 ENCOUNTER — Ambulatory Visit (INDEPENDENT_AMBULATORY_CARE_PROVIDER_SITE_OTHER): Payer: Self-pay | Admitting: *Deleted

## 2014-10-16 VITALS — BP 121/74 | HR 92

## 2014-10-16 DIAGNOSIS — O24419 Gestational diabetes mellitus in pregnancy, unspecified control: Secondary | ICD-10-CM

## 2014-10-16 NOTE — Progress Notes (Signed)
IOL scheduled 2/14 @ 0730

## 2014-10-16 NOTE — Progress Notes (Signed)
NST reactive.

## 2014-10-20 ENCOUNTER — Ambulatory Visit (HOSPITAL_COMMUNITY)
Admission: RE | Admit: 2014-10-20 | Discharge: 2014-10-20 | Disposition: A | Discharge status: Home / Self Care | Payer: Self-pay | Source: Ambulatory Visit | Attending: Obstetrics and Gynecology | Admitting: Obstetrics and Gynecology

## 2014-10-20 ENCOUNTER — Encounter: Payer: Self-pay | Admitting: Obstetrics and Gynecology

## 2014-10-20 ENCOUNTER — Ambulatory Visit (INDEPENDENT_AMBULATORY_CARE_PROVIDER_SITE_OTHER): Payer: Self-pay | Admitting: Obstetrics and Gynecology

## 2014-10-20 VITALS — BP 129/82 | HR 93 | Wt 203.1 lb

## 2014-10-20 DIAGNOSIS — Z3A38 38 weeks gestation of pregnancy: Secondary | ICD-10-CM | POA: Insufficient documentation

## 2014-10-20 DIAGNOSIS — O3421 Maternal care for scar from previous cesarean delivery: Secondary | ICD-10-CM | POA: Insufficient documentation

## 2014-10-20 DIAGNOSIS — Z36 Encounter for antenatal screening of mother: Secondary | ICD-10-CM | POA: Insufficient documentation

## 2014-10-20 DIAGNOSIS — O24419 Gestational diabetes mellitus in pregnancy, unspecified control: Secondary | ICD-10-CM | POA: Insufficient documentation

## 2014-10-20 DIAGNOSIS — O0993 Supervision of high risk pregnancy, unspecified, third trimester: Secondary | ICD-10-CM

## 2014-10-20 DIAGNOSIS — O09213 Supervision of pregnancy with history of pre-term labor, third trimester: Secondary | ICD-10-CM | POA: Insufficient documentation

## 2014-10-20 DIAGNOSIS — O09523 Supervision of elderly multigravida, third trimester: Secondary | ICD-10-CM | POA: Insufficient documentation

## 2014-10-20 DIAGNOSIS — O0933 Supervision of pregnancy with insufficient antenatal care, third trimester: Secondary | ICD-10-CM

## 2014-10-20 DIAGNOSIS — O358XX0 Maternal care for other (suspected) fetal abnormality and damage, not applicable or unspecified: Secondary | ICD-10-CM | POA: Insufficient documentation

## 2014-10-20 LAB — POCT URINALYSIS DIP (DEVICE)
Bilirubin Urine: NEGATIVE
Glucose, UA: NEGATIVE mg/dL
KETONES UR: NEGATIVE mg/dL
Nitrite: NEGATIVE
PH: 6 (ref 5.0–8.0)
PROTEIN: NEGATIVE mg/dL
Urobilinogen, UA: 0.2 mg/dL (ref 0.0–1.0)

## 2014-10-20 NOTE — Progress Notes (Signed)
NST/OBF US for growth done today Interpreter Maretta LosBlanca Lindner present for encounter today

## 2014-10-20 NOTE — Progress Notes (Signed)
Patient is doing well. CBGs reviewed- a few pp elevated. Patient admits to overeating on occasion. Continue current glyburide regimen. Patient had ultrasound today- report not available for review. Answered questions regarding IOL. Discussed risks of fetal macrosomia with GDM and protracted labor with possibility of cesarean section for CPD. Patient verbalized understanding and all questions were answered. Patient scheduled for IOL on 10/26/2014 at 7:30 am NST reviewed and reactive

## 2014-10-23 ENCOUNTER — Ambulatory Visit (INDEPENDENT_AMBULATORY_CARE_PROVIDER_SITE_OTHER): Payer: Self-pay | Admitting: General Practice

## 2014-10-23 VITALS — BP 118/69 | HR 90 | Wt 204.4 lb

## 2014-10-23 DIAGNOSIS — O24419 Gestational diabetes mellitus in pregnancy, unspecified control: Secondary | ICD-10-CM

## 2014-10-23 NOTE — Progress Notes (Signed)
NST performed today was reviewed and was found to be reactive.  Continue recommended antenatal testing and prenatal care.  

## 2014-10-24 ENCOUNTER — Telehealth (HOSPITAL_COMMUNITY): Payer: Self-pay | Admitting: *Deleted

## 2014-10-24 NOTE — Telephone Encounter (Signed)
Preadmission screen Interpreter number [id number] 

## 2014-10-26 ENCOUNTER — Inpatient Hospital Stay (HOSPITAL_COMMUNITY)
Admission: RE | Admit: 2014-10-26 | Discharge: 2014-10-28 | DRG: 775 | Disposition: A | Discharge status: Home / Self Care | Payer: Medicaid Other | Source: Ambulatory Visit | Attending: Obstetrics and Gynecology | Admitting: Obstetrics and Gynecology

## 2014-10-26 ENCOUNTER — Encounter (HOSPITAL_COMMUNITY): Payer: Self-pay

## 2014-10-26 DIAGNOSIS — O09213 Supervision of pregnancy with history of pre-term labor, third trimester: Secondary | ICD-10-CM | POA: Diagnosis not present

## 2014-10-26 DIAGNOSIS — O3421 Maternal care for scar from previous cesarean delivery: Secondary | ICD-10-CM | POA: Diagnosis present

## 2014-10-26 DIAGNOSIS — O09523 Supervision of elderly multigravida, third trimester: Secondary | ICD-10-CM | POA: Diagnosis not present

## 2014-10-26 DIAGNOSIS — N858 Other specified noninflammatory disorders of uterus: Secondary | ICD-10-CM | POA: Diagnosis present

## 2014-10-26 DIAGNOSIS — IMO0002 Reserved for concepts with insufficient information to code with codable children: Secondary | ICD-10-CM | POA: Diagnosis not present

## 2014-10-26 DIAGNOSIS — O24419 Gestational diabetes mellitus in pregnancy, unspecified control: Secondary | ICD-10-CM | POA: Diagnosis present

## 2014-10-26 DIAGNOSIS — Z3A39 39 weeks gestation of pregnancy: Secondary | ICD-10-CM | POA: Diagnosis present

## 2014-10-26 DIAGNOSIS — Z79899 Other long term (current) drug therapy: Secondary | ICD-10-CM

## 2014-10-26 DIAGNOSIS — O358XX0 Maternal care for other (suspected) fetal abnormality and damage, not applicable or unspecified: Secondary | ICD-10-CM | POA: Diagnosis present

## 2014-10-26 DIAGNOSIS — O24429 Gestational diabetes mellitus in childbirth, unspecified control: Secondary | ICD-10-CM | POA: Diagnosis present

## 2014-10-26 DIAGNOSIS — O09299 Supervision of pregnancy with other poor reproductive or obstetric history, unspecified trimester: Secondary | ICD-10-CM | POA: Diagnosis not present

## 2014-10-26 DIAGNOSIS — Q740 Other congenital malformations of upper limb(s), including shoulder girdle: Secondary | ICD-10-CM | POA: Diagnosis not present

## 2014-10-26 LAB — CBC WITH DIFFERENTIAL/PLATELET
BASOS PCT: 0 % (ref 0–1)
Basophils Absolute: 0 10*3/uL (ref 0.0–0.1)
EOS ABS: 0 10*3/uL (ref 0.0–0.7)
EOS PCT: 0 % (ref 0–5)
HEMATOCRIT: 32.2 % — AB (ref 36.0–46.0)
Hemoglobin: 10.8 g/dL — ABNORMAL LOW (ref 12.0–15.0)
LYMPHS ABS: 0.8 10*3/uL (ref 0.7–4.0)
Lymphocytes Relative: 4 % — ABNORMAL LOW (ref 12–46)
MCH: 28.5 pg (ref 26.0–34.0)
MCHC: 33.5 g/dL (ref 30.0–36.0)
MCV: 85 fL (ref 78.0–100.0)
MONO ABS: 0.8 10*3/uL (ref 0.1–1.0)
MONOS PCT: 4 % (ref 3–12)
NEUTROS ABS: 19 10*3/uL — AB (ref 1.7–7.7)
Neutrophils Relative %: 92 % — ABNORMAL HIGH (ref 43–77)
Platelets: 208 10*3/uL (ref 150–400)
RBC: 3.79 MIL/uL — ABNORMAL LOW (ref 3.87–5.11)
RDW: 14.7 % (ref 11.5–15.5)
WBC: 20.7 10*3/uL — ABNORMAL HIGH (ref 4.0–10.5)

## 2014-10-26 LAB — GLUCOSE, CAPILLARY: Glucose-Capillary: 84 mg/dL (ref 70–99)

## 2014-10-26 LAB — TYPE AND SCREEN
ABO/RH(D): O POS
Antibody Screen: NEGATIVE

## 2014-10-26 LAB — CBC
HCT: 35.2 % — ABNORMAL LOW (ref 36.0–46.0)
Hemoglobin: 11.7 g/dL — ABNORMAL LOW (ref 12.0–15.0)
MCH: 28.3 pg (ref 26.0–34.0)
MCHC: 33.2 g/dL (ref 30.0–36.0)
MCV: 85.2 fL (ref 78.0–100.0)
Platelets: 209 10*3/uL (ref 150–400)
RBC: 4.13 MIL/uL (ref 3.87–5.11)
RDW: 14.6 % (ref 11.5–15.5)
WBC: 8.4 10*3/uL (ref 4.0–10.5)

## 2014-10-26 LAB — ABO/RH: ABO/RH(D): O POS

## 2014-10-26 MED ORDER — DIPHENHYDRAMINE HCL 25 MG PO CAPS: 25.0000 mg | ORAL_CAPSULE | Freq: Four times a day (QID) | ORAL | Status: DC | PRN

## 2014-10-26 MED ORDER — LACTATED RINGERS IV SOLN
INTRAVENOUS | Status: DC
  Administered 2014-10-26: 09:00:00 via INTRAVENOUS

## 2014-10-26 MED ORDER — ZOLPIDEM TARTRATE 5 MG PO TABS: 5.0000 mg | ORAL_TABLET | Freq: Every evening | ORAL | Status: DC | PRN

## 2014-10-26 MED ORDER — FENTANYL CITRATE 0.05 MG/ML IJ SOLN
100.0000 ug | INTRAMUSCULAR | Status: DC | PRN
  Administered 2014-10-26: 100 ug via INTRAVENOUS

## 2014-10-26 MED ORDER — WITCH HAZEL-GLYCERIN EX PADS: 1.0000 "application " | MEDICATED_PAD | CUTANEOUS | Status: DC | PRN

## 2014-10-26 MED ORDER — OXYCODONE-ACETAMINOPHEN 5-325 MG PO TABS: 2.0000 | ORAL_TABLET | ORAL | Status: DC | PRN

## 2014-10-26 MED ORDER — SODIUM CHLORIDE 0.9 % IV SOLN: 250.0000 mL | INTRAVENOUS | Status: DC | PRN

## 2014-10-26 MED ORDER — LANOLIN HYDROUS EX OINT: TOPICAL_OINTMENT | CUTANEOUS | Status: DC | PRN

## 2014-10-26 MED ORDER — ACETAMINOPHEN 325 MG PO TABS
650.0000 mg | ORAL_TABLET | Freq: Four times a day (QID) | ORAL | Status: DC | PRN
  Administered 2014-10-26: 650 mg via ORAL
  Filled 2014-10-26: qty 2

## 2014-10-26 MED ORDER — MISOPROSTOL 200 MCG PO TABS
ORAL_TABLET | ORAL | Status: AC
  Filled 2014-10-26: qty 5

## 2014-10-26 MED ORDER — OXYTOCIN BOLUS FROM INFUSION
500.0000 mL | INTRAVENOUS | Status: DC
Start: 2014-10-26 — End: 2014-10-26

## 2014-10-26 MED ORDER — SODIUM CHLORIDE 0.9 % IJ SOLN: 3.0000 mL | Freq: Two times a day (BID) | INTRAMUSCULAR | Status: DC

## 2014-10-26 MED ORDER — LACTATED RINGERS IV SOLN: 500.0000 mL | INTRAVENOUS | Status: DC | PRN

## 2014-10-26 MED ORDER — ONDANSETRON HCL 4 MG/2ML IJ SOLN: 4.0000 mg | Freq: Four times a day (QID) | INTRAMUSCULAR | Status: DC | PRN

## 2014-10-26 MED ORDER — OXYTOCIN 40 UNITS IN LACTATED RINGERS INFUSION - SIMPLE MED
1.0000 m[IU]/min | INTRAVENOUS | Status: DC
  Administered 2014-10-26: 2 m[IU]/min via INTRAVENOUS
  Administered 2014-10-26: 666 m[IU]/min via INTRAVENOUS
  Filled 2014-10-26: qty 1000

## 2014-10-26 MED ORDER — FLEET ENEMA 7-19 GM/118ML RE ENEM: 1.0000 | ENEMA | RECTAL | Status: DC | PRN

## 2014-10-26 MED ORDER — OXYTOCIN 40 UNITS IN LACTATED RINGERS INFUSION - SIMPLE MED
62.5000 mL/h | INTRAVENOUS | Status: DC
  Administered 2014-10-26: 62.5 mL/h via INTRAVENOUS

## 2014-10-26 MED ORDER — TERBUTALINE SULFATE 1 MG/ML IJ SOLN: 0.2500 mg | Freq: Once | INTRAMUSCULAR | Status: DC | PRN

## 2014-10-26 MED ORDER — SIMETHICONE 80 MG PO CHEW: 80.0000 mg | CHEWABLE_TABLET | ORAL | Status: DC | PRN

## 2014-10-26 MED ORDER — OXYCODONE-ACETAMINOPHEN 5-325 MG PO TABS: 1.0000 | ORAL_TABLET | ORAL | Status: DC | PRN

## 2014-10-26 MED ORDER — OXYTOCIN 40 UNITS IN LACTATED RINGERS INFUSION - SIMPLE MED: 62.5000 mL/h | INTRAVENOUS | Status: DC | PRN

## 2014-10-26 MED ORDER — LIDOCAINE HCL (PF) 1 % IJ SOLN
30.0000 mL | INTRAMUSCULAR | Status: DC | PRN
  Filled 2014-10-26: qty 30

## 2014-10-26 MED ORDER — ACETAMINOPHEN 325 MG PO TABS: 650.0000 mg | ORAL_TABLET | ORAL | Status: DC | PRN

## 2014-10-26 MED ORDER — SENNOSIDES-DOCUSATE SODIUM 8.6-50 MG PO TABS
2.0000 | ORAL_TABLET | ORAL | Status: DC
  Administered 2014-10-27 – 2014-10-28 (×2): 2 via ORAL
  Filled 2014-10-26 (×2): qty 2

## 2014-10-26 MED ORDER — BISACODYL 10 MG RE SUPP: 10.0000 mg | Freq: Every day | RECTAL | Status: DC | PRN

## 2014-10-26 MED ORDER — DIBUCAINE 1 % RE OINT: 1.0000 "application " | TOPICAL_OINTMENT | RECTAL | Status: DC | PRN

## 2014-10-26 MED ORDER — MISOPROSTOL 25 MCG QUARTER TABLET
100.0000 ug | ORAL_TABLET | Freq: Once | ORAL | Status: AC
  Administered 2014-10-26: 1000 ug via ORAL

## 2014-10-26 MED ORDER — ONDANSETRON HCL 4 MG PO TABS: 4.0000 mg | ORAL_TABLET | ORAL | Status: DC | PRN

## 2014-10-26 MED ORDER — CITRIC ACID-SODIUM CITRATE 334-500 MG/5ML PO SOLN: 30.0000 mL | ORAL | Status: DC | PRN

## 2014-10-26 MED ORDER — IBUPROFEN 600 MG PO TABS
600.0000 mg | ORAL_TABLET | Freq: Four times a day (QID) | ORAL | Status: DC
  Administered 2014-10-26 – 2014-10-28 (×7): 600 mg via ORAL
  Filled 2014-10-26 (×7): qty 1

## 2014-10-26 MED ORDER — FENTANYL CITRATE 0.05 MG/ML IJ SOLN
INTRAMUSCULAR | Status: AC
  Filled 2014-10-26: qty 2

## 2014-10-26 MED ORDER — PRENATAL MULTIVITAMIN CH
1.0000 | ORAL_TABLET | Freq: Every day | ORAL | Status: DC
  Administered 2014-10-27: 1 via ORAL
  Filled 2014-10-26: qty 1

## 2014-10-26 MED ORDER — ONDANSETRON HCL 4 MG/2ML IJ SOLN: 4.0000 mg | INTRAMUSCULAR | Status: DC | PRN

## 2014-10-26 MED ORDER — SODIUM CHLORIDE 0.9 % IJ SOLN: 3.0000 mL | INTRAMUSCULAR | Status: DC | PRN

## 2014-10-26 MED ORDER — FLEET ENEMA 7-19 GM/118ML RE ENEM: 1.0000 | ENEMA | Freq: Every day | RECTAL | Status: DC | PRN

## 2014-10-26 MED ORDER — BENZOCAINE-MENTHOL 20-0.5 % EX AERO: 1.0000 "application " | INHALATION_SPRAY | CUTANEOUS | Status: DC | PRN

## 2014-10-26 NOTE — Progress Notes (Signed)
Assisted RN and Dr Emelda FearFerguson and Dr Loreta AveAcosta with interpretation of delivery instructions.   Spanish Interpreter

## 2014-10-26 NOTE — Progress Notes (Signed)
Checked on patients needs also ordered patient dinner.   Spanish Interpreter

## 2014-10-26 NOTE — H&P (Signed)
LABOR ADMISSION HISTORY AND PHYSICAL  Kara Dominguez is a 34 y.o. female [phone] with IUP at [redacted]w[redacted]d by LMP presenting for IOL 2/2 A2DM. She reports +FMs, No LOF, no VB, no blurry vision, headaches or peripheral edema, and RUQ pain.  She plans on breast feeding. She request condoms for birth control.  Dating: By LMP --->  Estimated Date of Delivery: 11/02/14  Sono:    , CWD, normal anatomy, cephalic presentation,  4227g, >90% EFW - bilateral pyelectasis  Prenatal History/Complications:  Past Medical History: No past medical history on file.  Past Surgical History: Past Surgical History  Procedure Laterality Date  . Cesarean section      Obstetrical History: OB History    Gravida Para Term Preterm AB TAB SAB Ectopic Multiple Living   Social History: History   Social History  . Marital Status: Single    Spouse Name: N/A  . Number of Children: N/A  . Years of Education: N/A   Social History Main Topics  . Smoking status: Never Smoker   . Smokeless tobacco: Never Used  . Alcohol Use: No  . Drug Use: No  . Sexual Activity: Yes    Birth Control/ Protection: None   Other Topics Concern  . Not on file   Social History Narrative    Family History: No family history on file.  Allergies: No Known Allergies  Prescriptions prior to admission  Medication Sig Dispense Refill Last Dose  . glyBURIDE (DIABETA) 5 MG tablet Take 10 mg q am (2 tablets) and 7.5 mg qhs (1.5 tablet) 60 tablet 1 10/25/2014 at Unknown time  . Prenatal Multivit-Min-Fe-FA (PRENATAL VITAMINS) 0.8 MG tablet Take 1 tablet by mouth daily. 30 tablet 12 10/20/2014  . progesterone (PROMETRIUM) 200 MG capsule Place 1 capsule (200 mg total) vaginally daily. 30 capsule 11 10/20/2014     Review of Systems   All systems reviewed and negative except as stated in HPI  Last menstrual period 01/26/2014. General appearance: alert and cooperative Lungs: clear to auscultation  bilaterally Heart: regular rate and rhythm Abdomen: soft, non-tender; bowel sounds normal Extremities: Homans sign is negative, no sign of DVT Presentation: cephalic Fetal monitoringBaseline: 145 bpm, Variability: Good {> 6 bpm), Accelerations: Non-reactive but appropriate for gestational age and Decelerations: Absent Uterine activity q53min      Prenatal labs: ABO, Rh: O/POS/-- (09/21 1508) Antibody: NEG (09/21 1508) Rubella:   RPR: NON REAC (11/23 0943)  HBsAg: NEGATIVE (09/21 1508)  HIV: NONREACTIVE (11/23 0943)  GBS: Negative (01/25 0000)  3 hr Glucola 78/190/168/111 Genetic screening  Unclear if not ordered or was declined Anatomy US right sided pyelectasis     VBAC consent signed 07/14/14 - copy under media tab Clinic Transferred from [facility] at 23 weeks Prenatal Labs  Dating 19 week ultrasound Blood type: O/POS/-- (09/21 1508)  Genetic Screen Too late Antibody:NEG (09/21 1508)  Anatomic [place] Female; R sided pyelectasis (4.74mm), otherwise wnl > f/u .1 mild persistent pyelectasis => postnatal eval of newborn kidneys advised Rubella: 9.73 (09/21 1508)  GTT Early 3 hr: 78/190/168/111 RPR: NON REAC (09/21 1508)   TDaP vaccine Nov 2015 HBsAg: NEGATIVE (09/21 1508)   Flu vaccine Sept 2015 HIV: NONREACTIVE (09/21 1508)   GBS Negative GBS: NEGATIVE  Contraception condoms UUV:OZDG/UY+  Baby Food breast   Circumcision declines   Pediatrician [facility]    Support Person FOB  Results for orders placed or performed during the hospital encounter of 10/26/14 (from the past 24 hour(s))  CBC   Collection Time: 10/26/14  8:35 AM  Result Value Ref Range   WBC 8.4 4.0 - 10.5 K/uL   RBC 4.13 3.87 - 5.11 MIL/uL   Hemoglobin 11.7 (L) 12.0 - 15.0 g/dL   HCT 19.135.2 (L) 47.836.0 - 29.546.0 %   MCV 85.2 78.0 - 100.0 fL   MCH 28.3 26.0 - 34.0 pg   MCHC 33.2 30.0 - 36.0 g/dL   RDW 62.114.6 30.811.5 - 65.715.5 %   Platelets 209 150 - 400 K/uL    Patient Active Problem List   Diagnosis Date Noted  . Normal  labor and delivery 10/26/2014  . [redacted] weeks gestation of pregnancy   . Pregnancy complicated by fetal GU abnormality   . Maternal age 35+, multigravida, antepartum   . Supervision of high-risk pregnancy 07/07/2014  . Late prenatal care 07/07/2014  . GDM (gestational diabetes mellitus) 07/07/2014  . Pyelectasis of fetus on prenatal ultrasound 07/07/2014  . History of preterm delivery 06/10/2014    Assessment: Kara Dominguez is a 34 y.o. [phone] at 8990w0d here for IOL 2/2 A2DM on glyburide in setting of previous cesarean section  #Labor:pitocin for augmentation, monitor cautiously as pt is not ideal candidate for TOL #Pain: At this time would prefer natural birth, declines epidural #FWB: Cat I, bilateral pyelectasis noted in PITT #ID:  GBS neg #MOF: breast #MOC:condoms #A2DM: q4h glucose checks  Discussed risks and benefits of trial of labor, including failure and uterine rupture.  Consent signed in clinic.  Pt still agreeable and requests TOL.  Patient offered elective repeat cesarean 2/2 suspected macrosomia and pt declined.  Kara Dominguez Kara Dominguez 10/26/2014, 9:43 AM

## 2014-10-27 DIAGNOSIS — IMO0002 Reserved for concepts with insufficient information to code with codable children: Secondary | ICD-10-CM | POA: Diagnosis not present

## 2014-10-27 DIAGNOSIS — O09299 Supervision of pregnancy with other poor reproductive or obstetric history, unspecified trimester: Secondary | ICD-10-CM | POA: Diagnosis not present

## 2014-10-27 DIAGNOSIS — Q740 Other congenital malformations of upper limb(s), including shoulder girdle: Secondary | ICD-10-CM | POA: Diagnosis not present

## 2014-10-27 LAB — CBC WITH DIFFERENTIAL/PLATELET
BASOS PCT: 0 % (ref 0–1)
Basophils Absolute: 0 10*3/uL (ref 0.0–0.1)
EOS ABS: 0 10*3/uL (ref 0.0–0.7)
EOS PCT: 0 % (ref 0–5)
HCT: 27.2 % — ABNORMAL LOW (ref 36.0–46.0)
Hemoglobin: 9.2 g/dL — ABNORMAL LOW (ref 12.0–15.0)
LYMPHS ABS: 2.1 10*3/uL (ref 0.7–4.0)
Lymphocytes Relative: 16 % (ref 12–46)
MCH: 28.5 pg (ref 26.0–34.0)
MCHC: 33.8 g/dL (ref 30.0–36.0)
MCV: 84.2 fL (ref 78.0–100.0)
MONO ABS: 1.1 10*3/uL — AB (ref 0.1–1.0)
Monocytes Relative: 9 % (ref 3–12)
NEUTROS PCT: 75 % (ref 43–77)
Neutro Abs: 9.5 10*3/uL — ABNORMAL HIGH (ref 1.7–7.7)
PLATELETS: 191 10*3/uL (ref 150–400)
RBC: 3.23 MIL/uL — ABNORMAL LOW (ref 3.87–5.11)
RDW: 14.9 % (ref 11.5–15.5)
WBC: 12.7 10*3/uL — ABNORMAL HIGH (ref 4.0–10.5)

## 2014-10-27 LAB — RPR: RPR Ser Ql: NONREACTIVE

## 2014-10-27 NOTE — Lactation Note (Signed)
This note was copied from the chart of Kara Dominguez. Lactation Consultation Note  [facility] Interpreter Vernona RiegerLaura #[id number] FOB speaks english. P4, Ex BF, BF for one year with last child. Mother has a blister on left nipple.  Provided mother w/ comfort gels and reviewed applying ebm. Discussed making sure baby is not bf on nipple only and how to achieve a deeper latch. Baby being placed on double phototherapy. Mother denies other problems w/ bf.  Reviewed cluster feeding. Mom encouraged to feed baby 8-12 times/24 hours and with feeding cues.  Mom made aware of O/P services, breastfeeding support groups, community resources, and our phone # for post-discharge questions in Spanish.      Patient Name: Kara Dominguez EAVWU'JToday's Date: 10/27/2014 Reason for consult: Follow-up assessment   Maternal Data    Feeding Feeding Type: Breast Fed Length of feed: 30 min  LATCH Score/Interventions                      Lactation Tools Discussed/Used     Consult Status Consult Status: Follow-up Date: 10/28/14 Follow-up type: In-patient    Dahlia ByesBerkelhammer, Ruth [facility]os[facility] 10/27/2014, 10:29 AM

## 2014-10-27 NOTE — Discharge Summary (Signed)
Obstetric Discharge Summary Reason for Admission: induction of labor Prenatal Procedures: ultrasound Intrapartum Procedures: vaginal delivery after cesarean section Postpartum Procedures: none Complications-Operative and Postpartum: 2nd degree perineal laceration, shoulder dystocia HEMOGLOBIN  Date Value Ref Range Status  10/27/2014 9.2* 12.0 - 15.0 g/dL Final   HCT  Date Value Ref Range Status  10/27/2014 27.2* 36.0 - 46.0 % Final    Today: doing well, mild pain, breast feeding no complications, some perineal soreness.  Wants condoms for contraception  Fellow under my supervision/collaboration. Chart reviewed and agree with management and plan. I was present in the delivery room, gloved in, for entire delivery, and managed Woods screw clockwise rotation to release the posterior arm, by rotation of the baby such That the right arm delivered from the anterior position. Tilda BurrowFERGUSON,JOHN V 10/26/2014 5:26 PM      Expand All Collapse All   Patient is 34 y.o. [id number] 9027w0d admitted for induction trial of labor, hx of A2DM on glyburide (initially uncontrolled), previous cesarean section followed by a VBAC preterm   Delivery Note At 3:29 PM a viable female was delivered via Vaginal, Spontaneous Delivery (Presentation: Right Occiput Anterior). APGAR: 7, 9; weight .  Placenta status: Intact, Spontaneous. Cord: 3 vessels with the following complications: None. Attempt at drawing cord gas failed. Dr. Emelda FearFerguson present for delivery in it's entirety, aided in delivery of shoulders  Dystocia: 30-45seconds, Mcrobert's, suprapubic pressure, posterior shoulder delivered first (left) before (right) anterior shoulder which appeared to have decreased tone after delivery  Anesthesia: None  Episiotomy: None Lacerations: 2nd degree Suture Repair: 3.0 vicryl rapide Est. Blood Loss (mL): 450  Mom to postpartum. Baby to Couplet care / Skin to Skin.      Physical Exam:  General: alert and  cooperative Lochia: appropriate Uterine Fundus: firm DVT Evaluation: No evidence of DVT seen on physical exam.  Discharge Diagnoses: Term Pregnancy-delivered, gestational diabetes, shoulder dystocia, 2nd degree laceration  Discharge Information: Date: 10/27/2014 Activity: pelvic rest Diet: routine Medications: None Condition: stable Instructions: refer to practice specific booklet Discharge to: home Follow-up Information    [facility].   Specialty:  Obstetrics and Gynecology   Contact information:   [address] [place] [id number] [phone]      Newborn Data: Live born female  Birth Weight: 9 lb 2.4 oz (4150 g) APGAR: 7, 9  Home with mother.  Graceson Nichelson [name] 10/27/2014, 9:23 AM

## 2014-10-27 NOTE — Progress Notes (Signed)
I assisted Dr. Adriana Simasook with explanation of care plan for the baby. Eda H Royal  Interpreter.

## 2014-10-27 NOTE — Progress Notes (Signed)
I assisted SaraRN with some questions and explanation about Hepatitis vaccine for baby,by Orlan LeavensViria Alvarez Spanish Interpreter

## 2014-10-27 NOTE — Progress Notes (Signed)
I ordered patient's lunch.  Kara Dominguez  Interpreter. °

## 2014-10-27 NOTE — Progress Notes (Signed)
I assisted Rn with some questions and I ordered Her dinner and breakfast, by Orlan LeavensViria Alvarez Spanish Interpreter

## 2014-10-27 NOTE — Progress Notes (Signed)
UR chart review completed.  

## 2014-10-28 NOTE — Discharge Instructions (Signed)

## 2014-10-28 NOTE — Progress Notes (Signed)
I stopped by patient's room to check on her needs,  I ordered her dinner , snack, and breakfast, by Orlan LeavensViria Alvarez Spanish Interpreter

## 2014-10-28 NOTE — Progress Notes (Signed)
I assisted  Dr.Bradshaw with explanation of care plan for the Baby. Eda H Royal  Interpreter.

## 2014-10-28 NOTE — Progress Notes (Signed)
I assisted Donna,RN with discharge instructions for Mom. Eda H Royal Interpreter.

## 2014-10-28 NOTE — Progress Notes (Signed)
I ordered patient's lunch.  Kara Dominguez  Interpreter. °

## 2014-10-28 NOTE — Lactation Note (Signed)
This note was copied from the chart of Kara Dominguez. Lactation Consultation Note  Eda interpreter Present. Mother states she was able to get small drops of colostrum when she pumped last but now feels her breast filling. Baby sleeping under double phototherapy.  Recently bf for 40 min. Encouraged mother to pump every 3 hours to increase her milk supply and give baby supplement to reduce jaundice. She needs to pump often during the day and may skip pumping 1-2 times at night.  Patient Name: Kara Dominguez UJWJX'BToday's Date: 10/28/2014 Reason for consult: Follow-up assessment   Maternal Data    Feeding Feeding Type: Breast Fed Length of feed: 40 min  LATCH Score/Interventions                      Lactation Tools Discussed/Used     Consult Status Consult Status: Follow-up Date: 10/29/14 Follow-up type: In-patient    Dahlia ByesBerkelhammer, Averee Harb [facility] 10/28/2014, 10:52 AM

## 2014-10-28 NOTE — Discharge Summary (Signed)
Obstetric Discharge Summary Reason for Admission: induction of labor 2/2 A2GDM Prenatal Procedures: NST Intrapartum Procedures: spontaneous vaginal delivery; VBAC Postpartum Procedures: none Complications-Operative and Postpartum: 2nd degree perineal laceration  Delivery Note At 3:29 PM a viable female was delivered via Vaginal, Spontaneous Delivery (Presentation: Right Occiput Anterior).  APGAR: 7, 9; weight 9 lb 2.4 oz (4150 g).   Placenta status: Intact, Spontaneous.  Cord: 3 vessels with the following complications: None.  Cord pH: n/a  Anesthesia: None  Episiotomy: None Lacerations: 2nd degree Suture Repair: 3.0 vicryl rapide Est. Blood Loss (mL): 450  Mom to postpartum.  Baby to Couplet care / Skin to Skin.  Dominguez,Kara [name] 10/26/2014, 5:02 PM  Hospital Course:  Active Problems:   Normal labor and delivery   Shoulder dystocia   [name] is a 34 y.o. [phone] s/p VBAC.  [facility] secondary to A2GDM. She was admitted to L&D. Induction was uncomplicated and she progressed with pitocin.  She has postpartum course that was uncomplicated including no problems with ambulating, PO intake, urination, pain, or bleeding. The pt feels ready to go home and  will be discharged with outpatient follow-up.   Today: No acute events overnight.  Pt denies problems with ambulating, voiding or po intake.  She denies nausea or vomiting.  Pain is well controlled.  She has had flatus. She has not had bowel movement.  Lochia Small.  Plan for birth control is  condoms.  Method of Feeding: breast and bottle   H/H: Lab Results  Component Value Date/Time   HGB 9.2* 10/27/2014 05:57 AM   HCT 27.2* 10/27/2014 05:57 AM    Discharge Diagnoses: Term Pregnancy-delivered  Discharge Information: Date: 10/28/2014 Activity: pelvic rest Diet: routine  Medications: PNV, Ibuprofen and Colace Breast feeding:  Yes Condition:  stable Instructions: refer to handout Discharge to: home   Discharge Instructions    Call MD for:  redness, tenderness, or signs of infection (pain, swelling, redness, odor or green/yellow discharge around incision site)    Complete by:  As directed      Call MD for:  severe uncontrolled pain    Complete by:  As directed      Call MD for:  temperature >100.4    Complete by:  As directed      Diet - low sodium heart healthy    Complete by:  As directed             Medication List    STOP taking these medications        glyBURIDE 5 MG tablet  Commonly known as:  DIABETA     Prenatal Vitamins 0.8 MG tablet     progesterone 200 MG capsule  Commonly known as:  PROMETRIUM           Follow-up Information    [facility].   Specialty:  Obstetrics and Gynecology   Why:  and for diabetes testing   Contact information:   [address] [place] [phone]      Kara DaltonMcEachern, Callaway Hailes ,MD OB Fellow 10/28/2014,8:50 AM

## 2014-10-29 ENCOUNTER — Ambulatory Visit: Payer: Self-pay

## 2014-10-29 NOTE — Lactation Note (Signed)
This note was copied from the chart of Kara Dominguez. Lactation Consultation Note  Eda Interpreter present Mother breasts are filling becoming engorged. Mother's nipples are sore, cracked and bruised. Baby cueing. Mother latched baby in cradle hold w/ shallow latch. Assisted w/ latching baby deep on breast in football hold.  Sucks and swallows observed w/ breast massage. Mother notes it is still sore but tolerable but she feels more pull.  Viewed for 10 min and breast beginning to soften. Provided ice packs for after feeding and suggest mother post pump to soften.    Patient Name: Kara Dominguez ZOXWR'UToday's Date: 10/29/2014 Reason for consult: Follow-up assessment   Maternal Data    Feeding Feeding Type: Breast Fed Length of feed: 10 min (mom also gave 5cc of EBM)  LATCH Score/Interventions Latch: Grasps breast easily, tongue down, lips flanged, rhythmical sucking. Intervention(s): Skin to skin;Waking techniques Intervention(s): Adjust position;Assist with latch  Audible Swallowing: A few with stimulation Intervention(s): Skin to skin  Type of Nipple: Everted at rest and after stimulation  Comfort (Breast/Nipple): Engorged, cracked, bleeding, large blisters, severe discomfort  Problem noted: Filling Interventions (Filling): Double electric pump;Massage  Hold (Positioning): Assistance needed to correctly position infant at breast and maintain latch.  LATCH Score: 6  Lactation Tools Discussed/Used     Consult Status Consult Status: Complete    Hardie PulleyBerkelhammer, Ruth Boschen 10/29/2014, 9:33 AM

## 2014-10-30 ENCOUNTER — Ambulatory Visit: Payer: Self-pay

## 2014-10-30 LAB — GLUCOSE, CAPILLARY: GLUCOSE-CAPILLARY: 87 mg/dL (ref 70–99)

## 2014-10-30 NOTE — Lactation Note (Signed)
This note was copied from the chart of Kara Dominguez. Lactation Consultation Note  Patient Name: Kara Dominguez ZOXWR'UToday's Date: 10/30/2014 Reason for consult: Follow-up assessment;Hyperbilirubinemia Mom just finished pumping 52 ml of breast milk. Mom is intermittently breastfeeding due to sore nipples/trauma. Bilateral nipples are bruised, red, cracked. Mom reports some bleeding with pumping this am, however she is now using 27 flange and no bleeding with the last pumping and less discomfort. Mom reports she has comfort gels, LC advised to apply EBM to sore nipples. Mom's breast are soft after this pumping, she reports no engorgement. Discussed with Mom the importance of emptying her breast at least every 3 hours to protect milk supply. Mom reports she does want to continue to breastfeed. LC encouraged Mom to call with the next feeding for assist with latch. Discussed some options for Mom. If BF too painful now she can pump/bottle feed till nipples heal or she can BF with each feeding and supplement with EBM/formula as needed. Baby on triple photo therapy but bili levels decreasing. Mom does not have pump for home use. [facility]. May need loaner with D/C. Advised Mom if baby not going to breast to increase supplement to 45-60 ml with each feeding. [employer] interpreter [phone] used for visit. Mom to call with next feeding.   Maternal Data    Feeding Feeding Type: Formula  LATCH Score/Interventions                      Lactation Tools Discussed/Used Tools: Comfort gels;Pump Breast pump type: Double-Electric Breast Pump   Consult Status Consult Status: Follow-up Date: 10/30/14 Follow-up type: In-patient    Alfred LevinsGranger, Ovide Dusek Ann 10/30/2014, 2:28 PM

## 2014-10-31 ENCOUNTER — Ambulatory Visit: Payer: Self-pay

## 2014-10-31 NOTE — Lactation Note (Addendum)
This note was copied from the chart of Kara Dominguez. Lactation Consultation Note  Patient Name: Kara Dominguez ZOXWR'UToday's Date: 10/31/2014 Reason for consult: Follow-up assessment;Breast/nipple pain;Difficult latch;Hyperbilirubinemia Mom has continued to BF with some feeding, pump and bottle w/formula feeding. Mom's nipples are cracked/bleeding. She reports pain with nursing and pumping. Checked flange size and changed to size 30, with pumping at this visit, Mom reports no pain. No bleeding observed.  LC is concerned Mom's milk supply is decreasing. She had pumped 52 ml yesterday but reports pumping 15-20 today. FOB just gave baby bottle of formula. LC left phone number for parents to call with next feeding before d/c today to assess latch. Reviewed importance of Mom BF/pumping with each feeding to protect milk supply. LC left message with WIC to assist Mom with getting pump. [facility]. Gave Mom another pair of comfort gels. FOB present to interpret.    Maternal Data    Feeding Feeding Type: Formula  LATCH Score/Interventions       Type of Nipple: Everted at rest and after stimulation  Comfort (Breast/Nipple): Engorged, cracked, bleeding, large blisters, severe discomfort Problem noted: Cracked, bleeding, blisters, bruises Intervention(s): Double electric pump           Lactation Tools Discussed/Used Tools: Pump;Comfort gels;Flanges Flange Size: 30 Breast pump type: Double-Electric Breast Pump   Consult Status Consult Status: Follow-up Date: 10/31/14 Follow-up type: In-patient    Kara Dominguez, Kara Dominguez 10/31/2014, 11:19 AM

## 2014-10-31 NOTE — Lactation Note (Addendum)
This note was copied from the chart of Kara Dominguez. Lactation Consultation Note  Patient Name: Kara Dominguez WGNFA'OToday's Date: 10/31/2014 Reason for consult: Follow-up assessment;Breast/nipple pain;Difficult latch;Hyperbilirubinemia Parents [facility] with latching baby. Due to Mom's nipple trauma, LC initiated #20 nipple shield. After pre-filling the nipple shield with EBM to stimulate suckling baby latched well and demonstrated a good rhythmic suck. Reviewed with parents how to apply nipple shield and how to assess for depth with latch. Advised to look for breast milk in the nipple shield with feedings. Baby nursed from both breasts this visit. Some breast milk visible in the nipple shield at the end of the feeding on the left breast, scant amount from the right breast.  Mom then supplemented with EBM and formula to complete the feeding. Mom denied any discomfort with baby nursing using the nipple shield, no bleeding noted and Mom's nipple was round when baby came off the breast. Parents seemed pleased with the feeding.  Plan discussed with parents: BF each feeding - every 2-3 hours or more often if baby hungry. Use #20 nipple shield to help with latch. Keep baby nursing for 15-20 minutes both breasts each feeding. Look for breast milk in the nipple shield. Supplement after each feeding with 45-60 ml of EBM or formula. Post pump after each feeding for 15-20 minutes. At Night: If baby breastfeeds for 15-20 minutes both breast, Mom does not have to pump. Do supplement with 45-60 ml. Or If baby does not go to the breast - then supplement 60 ml of EBM/formula and Mom will need to pump 1-2 times at night. Whichever is easiest for Mom. Care for sore nipples reviewed with Mom. Engorgement care discussed.  OP f/u scheduled for Wednesday, 11/05/14 at 0900. [facility]  FOB present to interpret.   Maternal Data     Feeding Feeding Type: Breast Fed Length of feed: 10 min  LATCH Score/Interventions Latch: Grasps breast easily, tongue down, lips flanged, rhythmical sucking. Intervention(s): Adjust position;Assist with latch  Audible Swallowing: A few with stimulation  Type of Nipple: Everted at rest and after stimulation  Comfort (Breast/Nipple): Engorged, cracked, bleeding, large blisters, severe discomfort Problem noted: Cracked, bleeding, blisters, bruises Intervention(s): Expressed breast milk to nipple  Interventions (Filling): Double electric pump  Hold (Positioning): Assistance needed to correctly position infant at breast and maintain latch. Intervention(s): Breastfeeding basics reviewed;Support Pillows;Position options;Skin to skin  LATCH Score: 6  Lactation Tools Discussed/Used Tools: Nipple Shields;Pump;Comfort gels;Flanges Nipple shield size: 20 Flange Size: 30 Breast pump type: Double-Electric Breast Pump WIC Program: Yes   Consult Status Consult Status: Follow-up Date: 10/31/14 Follow-up type: In-patient    Kara Dominguez, Kara Dominguez 10/31/2014, 3:02 PM

## 2014-11-05 ENCOUNTER — Ambulatory Visit (HOSPITAL_COMMUNITY)
Admission: RE | Admit: 2014-11-05 | Discharge: 2014-11-05 | Disposition: A | Discharge status: Home / Self Care | Payer: Self-pay | Source: Ambulatory Visit | Attending: Obstetrics and Gynecology | Admitting: Obstetrics and Gynecology

## 2014-11-05 ENCOUNTER — Encounter: Payer: Self-pay | Admitting: Obstetrics & Gynecology

## 2014-11-05 NOTE — Lactation Note (Signed)
Lactation Consult  Mother's reason for visit:  Scheduled appointment at d/c due to difficult latch, Mom having cracked nipples and needing nipple shield to latch. Baby is now 5110 days old. Visit Type:  Outpatient Appointment Notes:  Mom reports she still has some cracking, redness but that her nipples are healing since she left the hospital. Mom reports she is pumping before putting baby to breast every 2 hours. She is receiving 50-60 ml with each pumping. She uses formula as needed to supplement if she does not have breast milk available. She is not using the nipple shield. Spanish Interpreter provided by hospital for this visit - Maretta LosBlanca Lindner. Consult:  Initial Lactation Consultant:  Alfred LevinsGranger, Mitchelle Sultan Ann  ________________________________________________________________________  Baby's Name: Bryan LemmaPedro Espinoza Campillo Date of Birth: 10/26/2015 Pediatrician: Dr. Marikay AlarEric Sonnenberg[facility] Gender: female Gestational Age: 6757w0d (At Birth) Birth Weight: 9 lb 2.4 oz (4150 g) Weight at Discharge: Weight: 8 lb 13.1 oz (4000 g)Date [facility]   10/28/14 2338 10/30/14 0000 10/31/14 0056  Weight: 8 lb 6.4 oz (3810 g) 8 lb 7.1 oz (3830 g) 8 lb 13.1 oz (4000 g)   [facility] 11/04/14 9 lb. 5.4 oz per Mom estimate Location:[facility] start  Weight Today: 9 lb. 3.3 oz/4178 gm.    ________________________________________________________________________  Mother's Name: Rush LandmarkMaria Campillo Gomez Type of delivery: SVB  Breastfeeding Experience:  P4. Oldest 2 children did not BF, her 3 baby BF for 6 months Maternal Medical Conditions:  Gestational diabetes mellitis Maternal Medications:  Motrin prn, PNV  ________________________________________________________________________  Breastfeeding History (Post Discharge)  Frequency of breastfeeding:  Every 2 hours Duration of feeding:  15-20 minutes  both breast most feedings.   Supplementation  Formula:  Volume 40 ml Frequency:  3-4/day Total volume per day:  120-16460ml       Brand: Similac  Breastmilk:  Volume 50-60 ml Frequency:  Every 2-3 hours Total volume per day:  600-720 ml  Method:  Bottle   Mom is pumping using Symphony DEBP every 2 hours before feeding baby at breast. She reports receiving 50-60 ml of EBM.  Infant Intake and Output Assessment  Voids:  8 in 24 hrs.  Color:  Clear yellow Stools:  4 in 24 hrs.  Color:  Yellow  ________________________________________________________________________  Maternal Breast Assessment  Breast:  Soft and Compressible Nipple:  Erect. Small crack on each nipple. LC notes Mom's nipples are healing well. This LC saw Mom at d/c on Friday.  Pain level:  7 with initial latch that improves with baby nursing Pain interventions:  Mom reports using olive oil  _______________________________________________________________________ Feeding Assessment/Evaluation  Initial feeding assessment:  Infant's oral assessment:  WNL  Positioning:  Cradle. Mom started out in cradle hold but LC changed to football hold to improve depth with latch and to help Mom with adjusting bottom lip to keep flanged.  Right breast  Mom reported PS of 7 with initial latch the improved to 0 with baby nursing and keeping bottom lip well flanged. Mom has slight compression line visible at the end of the feeding, no bleeding noted.  Suck assessment:  Nutritive  Pre-feed weight:  4178g  (9 lb. 3.3 oz.) Post-feed weight:  4208 g (9 lb. 4.5 oz.) Amount transferred:  30 ml Amount supplemented:  0 ml  Additional Feeding Assessment -   Infant's oral assessment:  WNL  Positioning:  Football Left breast   LC worked with Mom to latch  with good depth on the left breast using football hold.  Mom reported PS=7 with initial latch that improved to 0 with baby nursing and lips well flanged. Worked with Mom so she will be  able to bring bottom lip down when baby is at the breast to keep lips well flanged to sustain good depth. Encouraged Football hold instead of cradle hold. No bleeding or increased trauma with baby at the breast.   Suck assessment:  Nutritive  Pre-feed weight:  4208 g  (9 lb. 4.5 oz.) Post-feed weight:  4238 g (9 lb. 5.5 oz.) Amount transferred:  30 ml Amount supplemented:  0 ml  Total amount transferred:  60 ml Total supplement given:  0 ml  Parents were pleased that baby transferred milk well at the breast.  Encouraged Mom to BF whenever baby is hungry.  Advised Mom not to pump before BF. Reviewed with Mom that we want baby to get breast milk at the breast.  Advised Mom to BF 1st every feeding. If she wants to continue to supplement she can post pump during the day and have EBM to supplement as needed to satisfy baby.  Advised to refer to Baby N Me booklet for milk storage guidelines. Care for sore nipples reviewed, advised to apply EBM. OP f/u prn Peds f/u Monday 11/10/14.

## 2014-11-27 ENCOUNTER — Encounter: Payer: Self-pay | Admitting: Family Medicine

## 2014-11-27 ENCOUNTER — Ambulatory Visit (INDEPENDENT_AMBULATORY_CARE_PROVIDER_SITE_OTHER): Payer: Self-pay | Admitting: Family Medicine

## 2014-11-27 VITALS — BP 118/65 | HR 89 | Temp 97.3°F | Wt 189.5 lb

## 2014-11-27 DIAGNOSIS — Z3202 Encounter for pregnancy test, result negative: Secondary | ICD-10-CM

## 2014-11-27 DIAGNOSIS — Z01812 Encounter for preprocedural laboratory examination: Secondary | ICD-10-CM

## 2014-11-27 LAB — POCT PREGNANCY, URINE: PREG TEST UR: NEGATIVE

## 2014-11-27 MED ORDER — NORGESTIMATE-ETH ESTRADIOL 0.25-35 MG-MCG PO TABS: 1.0000 | ORAL_TABLET | Freq: Every day | ORAL | Status: DC

## 2014-11-27 NOTE — Progress Notes (Signed)
  Subjective:     Kara Dominguez is a 34 y.o. female who presents for a postpartum visit. She is 4 weeks postpartum following a spontaneous vaginal delivery. I have fully reviewed the prenatal and intrapartum course. The delivery was at 39 gestational weeks. Outcome: vaginal birth after cesarean (VBAC). Anesthesia: none. Postpartum course has been normal. Baby's course has been normal. Baby is feeding by breast. Bleeding no bleeding. Bowel function is normal. Bladder function is normal. Patient is not sexually active. Contraception method is OCP (estrogen/progesterone). Postpartum depression screening: negative.  The following portions of the patient's history were reviewed and updated as appropriate: allergies, current medications, past family history, past medical history, past social history, past surgical history and problem list.  Review of Systems Pertinent items are noted in HPI.   Objective:    BP 118/65 mmHg  Pulse 89  Temp(Src) 97.3 F (36.3 C) (Oral)  Wt 189 lb 8 oz (85.957 kg)  Breastfeeding? Yes  General:  alert, cooperative and no distress  Lungs: clear to auscultation bilaterally  Heart:  regular rate and rhythm, S1, S2 normal, no murmur, click, rub or gallop  Abdomen: soft, non-tender; bowel sounds normal; no masses,  no organomegaly   Vulva:  normal  Vagina: normal vagina, no discharge, exudate, lesion, or erythema  Rectal Exam: no masses, tenderness, nodules        Assessment:     Normal postpartum exam. Pap smear not done at today's visit.   Plan:    1. Contraception: OCP (estrogen/progesterone) 2. Follow up in: 1 year or as needed.

## 2014-11-27 NOTE — Patient Instructions (Signed)
  Place postpartum visit patient instructions here.  

## 2014-11-27 NOTE — Progress Notes (Signed)
Here today for PP. Desires OCPs for birth control. Denies having any sexual intercourse since delivery. UPT obtained.

## 2014-12-09 ENCOUNTER — Other Ambulatory Visit: Payer: Self-pay

## 2014-12-09 DIAGNOSIS — O24439 Gestational diabetes mellitus in the puerperium, unspecified control: Secondary | ICD-10-CM

## 2014-12-10 LAB — GLUCOSE TOLERANCE, 2 HOURS
Glucose, 2 hour: 120 mg/dL (ref 70–139)
Glucose, Fasting: 99 mg/dL (ref 70–99)

## 2014-12-15 ENCOUNTER — Telehealth: Payer: Self-pay

## 2014-12-15 NOTE — Telephone Encounter (Signed)
-----   Message from Levie HeritageJacob J Stinson, DO sent at 12/11/2014  1:45 PM EDT ----- Negative 2 hour GTT - please let pt know

## 2014-12-15 NOTE — Telephone Encounter (Signed)
Called patient with interpreter Blanca Linder and informed her of results. Patient verbalized understanding. No questions or concerns.  

## 2017-01-03 ENCOUNTER — Ambulatory Visit (INDEPENDENT_AMBULATORY_CARE_PROVIDER_SITE_OTHER): Payer: Self-pay | Admitting: Family Medicine

## 2017-01-03 VITALS — BP 126/85 | HR 90 | Temp 97.9°F | Resp 18 | Ht 64.76 in | Wt 190.2 lb

## 2017-01-03 DIAGNOSIS — L219 Seborrheic dermatitis, unspecified: Secondary | ICD-10-CM | POA: Insufficient documentation

## 2017-01-03 DIAGNOSIS — R300 Dysuria: Secondary | ICD-10-CM

## 2017-01-03 DIAGNOSIS — Z13220 Encounter for screening for lipoid disorders: Secondary | ICD-10-CM

## 2017-01-03 DIAGNOSIS — Z113 Encounter for screening for infections with a predominantly sexual mode of transmission: Secondary | ICD-10-CM

## 2017-01-03 LAB — POCT WET + KOH PREP
TRICH BY WET PREP: ABSENT
YEAST BY KOH: ABSENT
Yeast by wet prep: ABSENT

## 2017-01-03 LAB — POCT URINALYSIS DIP (MANUAL ENTRY)
BILIRUBIN UA: NEGATIVE
Glucose, UA: NEGATIVE mg/dL
Ketones, POC UA: NEGATIVE mg/dL
Nitrite, UA: NEGATIVE
Protein Ur, POC: NEGATIVE mg/dL
RBC UA: NEGATIVE
Spec Grav, UA: 1.025 (ref 1.010–1.025)
UROBILINOGEN UA: 0.2 U/dL
pH, UA: 5 (ref 5.0–8.0)

## 2017-01-03 NOTE — Progress Notes (Signed)
Chief Complaint  Patient presents with  . Dysuria    possible vaginal infection     HPI  Translator ID: [id number] She reports that she was given an injection for bad infection but was never told what the infection was or what the medication was. She reports that she does not know what type of infection she had and so she only received 2 injections and stopped going.  She reports that she was supposed to go for injections on 2 days and prescriptions She reports that today she has some burning when she urinates.  Patient also reports that she had a dry scaly scaly that is leading to worsening dry itchiness as well as continued hair loss She reports that her skin on her face is also dry. She does not have any known allergies  She reports that she is gaining weight and wanted to be screening for high cholesterol She reports that someone told her that her cholesterol could cause her symptoms   History reviewed. No pertinent past medical history.  Current Outpatient Prescriptions  Medication Sig Dispense Refill  . norgestimate-ethinyl estradiol (ORTHO-CYCLEN,SPRINTEC,PREVIFEM) 0.25-35 MG-MCG tablet Take 1 tablet by mouth daily. 1 Package 11   No current facility-administered medications for this visit.     Allergies: No Known Allergies  Past Surgical History:  Procedure Laterality Date  . CESAREAN SECTION      Social History   Social History  . Marital status: Single    Spouse name: N/A  . Number of children: N/A  . Years of education: N/A   Social History Main Topics  . Smoking status: Never Smoker  . Smokeless tobacco: Never Used  . Alcohol use No  . Drug use: No  . Sexual activity: Not Currently    Birth control/ protection: None   Other Topics Concern  . None   Social History Narrative  . None    ROS  Objective: Vitals:   01/03/17 1041  BP: 126/85  Pulse: 90  Resp: 18  Temp: 97.9 F (36.6 C)  TempSrc: Oral  SpO2: 98%  Weight: 190 lb 3.2 oz (86.3  kg)  Height: 5' 4.76" (1.645 m)   Body mass index is 31.88 kg/m.  Physical Exam  Constitutional: She is oriented to person, place, and time. She appears well-developed and well-nourished.  HENT:  Head: Normocephalic and atraumatic.  Eyes: Conjunctivae and EOM are normal.  Cardiovascular: Normal rate, regular rhythm and normal heart sounds.   No murmur heard. Pulmonary/Chest: Effort normal and breath sounds normal. No respiratory distress. She has no wheezes.  Neurological: She is alert and oriented to person, place, and time.  Skin: Skin is warm. No erythema.  Itchy dry scalp with flaky skin  Forehead and nasal crease with dryness    Vaginal exam Chaperone Breonna Labia normal bilaterally without skin lesions Urethral meatus normal appearing without erythema Vagina with one yellow stringy strip of discharge No CMT, ovaries small and not palpable Uterus midline, nontender   Component     Latest Ref Rng & Units 01/03/2017 01/03/2017        11:14 AM 11:14 AM  Color, UA     yellow yellow   Clarity, UA     clear cloudy (A)   Glucose     negative mg/dL negative   Bilirubin, UA     negative negative negative  Specific Gravity, UA     1.010 - 1.025 1.025   RBC, UA     negative negative  pH, UA     5.0 - 8.0 5.0   Protein, UA     negative mg/dL negative   Urobilinogen, UA     0.2 or 1.0 E.U./dL 0.2   Nitrite, UA     Negative Negative   Leukocytes, UA     Negative Trace (A)    Assessment and Plan Alondria was seen today for dysuria.  Diagnoses and all orders for this visit:  Dysuria- no uti noted on exam -     POCT urinalysis dipstick  Screen for STD (sexually transmitted disease)- wet prep negative for trich Will await other labs -     RPR -     GC/Chlamydia Probe Amp -     POCT Wet + KOH Prep  Screening, lipid- discussed lipid screening today -     Lipid panel  Seborrheic dermatitis- advised head and shoulders classic clean shampoo for treatment If that  does not work will try antifungal shampoo such a nizoral  A total of 30 minutes were spent face-to-face with the patient during this encounter and over half of that time was spent on counseling and coordination of care.  Most of the time was spent counseling why those std tests were selected She declined HIV testing Also discussed that her ua did not show uti Provided education about drinking water to keep urine clear Also discussed how to treat seborrheic dermatitis    Braelyn Jenson A Creta Levin

## 2017-01-03 NOTE — Patient Instructions (Addendum)
Tu cuero cabelludo est muy seco. Este cuero cabelludo seco con comezn se llama dermatitis seborreica. Pruebe el champ Classic Clean Head and Shoulders.  los resultados de [place] son normales  su torunda vaginal no muestra levadura, vaginosis bacteriana o trichomonas    IF you received an x-ray today, you will receive an invoice from [facility]. Please contact [facility] at [phone] with questions or concerns regarding your invoice.   IF you received labwork today, you will receive an invoice from [place]. Please contact [facility] at [phone] with questions or concerns regarding your invoice.   Our billing staff will not be able to assist you with questions regarding bills from these companies.  You will be contacted with the lab results as soon as they are available. The fastest way to get your results is to activate your My Chart account. Instructions are located on the last page of this paperwork. If you have not heard from [place] regarding the results in 2 weeks, please contact this office.     Disuria (Dysuria) La disuria es dolor o molestia al [employer]. El dolor o la molestia se pueden sentir en el conducto que transporta la orina fuera de la vejiga (uretra) o en el tejido que rodea los genitales. El dolor tambin se puede sentir en la zona de la ingle y en la parte inferior del abdomen y de la espalda. Quizs tenga que orinar con frecuencia o la sensacin repentina de tener que orinar (tenesmo vesical). La disuria puede afectar tanto a hombres como a mujeres, pero es ms comn en las mujeres. La causa puede deberse a muchos problemas diferentes:  Infeccin en las vas urinarias en mujeres.  Infeccin en los riones o la vejiga.  Clculos en los riones o la vejiga.  Ciertas enfermedades de transmisin sexual (ETS), como la clamidia.  Deshidratacin.  Inflamacin de la vagina.  Uso de ciertos medicamentos.  Uso de ciertos jabones o productos  perfumados que provocan irritacin. INSTRUCCIONES PARA EL CUIDADO EN EL HOGAR Controle su disuria para ver si hay cambios. Las siguientes indicaciones pueden ayudar a Psychologist, educational [employer] pueda sentir:  Beba suficiente lquido para Pharmacologist la orina clara o de color amarillo plido.  Vace la vejiga con frecuencia. Evite retener la orina durante largos perodos.  Despus de defecar, las mujeres deben limpiarse desde adelante hacia atrs, usando el papel higinico solo [place].  Vace la vejiga despus de Management consultant.  Tome los medicamentos solamente como se lo haya indicado el mdico.  Si le recetaron antibiticos, asegrese de terminarlos, incluso si comienza a sentirse mejor.  Evite la cafena, el t y el alcohol. Estos productos pueden Theatre manager vejiga y Probation officer disuria. En los hombres, el alcohol puede irritar la prstata.  Concurra a todas las visitas de control como se lo haya indicado el mdico. Esto es importante.  Si le realizaron pruebas para Landscape architect causa de la disuria, es su responsabilidad retirar los [place]. Consulte en el laboratorio o en el departamento en el que fue realizado el estudio cundo y cmo podr [employer]. Hable con el mdico si tiene [employer]. SOLICITE ATENCIN MDICA SI:  Siente dolor en la espalda o a los costados del cuerpo.  Tiene fiebre.  Tiene nuseas o vmitos.  Observa sangre en la orina.  Est orinando con ms frecuencia que lo habitual. SOLICITE ATENCIN MDICA DE INMEDIATO SI:  El dolor es intenso y no se alivia con los medicamentos.  No puede retener lquido.  Usted u otra persona advierten algn cambio en su funcin mental.  Tiene una frecuencia cardaca acelerada en reposo.  Tiene temblores o escalofros.  Se siente muy dbil. Esta informacin no tiene Theme park manager el consejo del mdico. Asegrese de hacerle al mdico cualquier pregunta que  tenga. Document Released: 09/18/2007 Document Revised: 12/21/2015 Document Reviewed: 04/24/2014 Elsevier Interactive Patient Education  2017 [employer].

## 2017-01-04 LAB — LIPID PANEL
CHOL/HDL RATIO: 3.6 ratio (ref 0.0–4.4)
CHOLESTEROL TOTAL: 165 mg/dL (ref 100–199)
HDL: 46 mg/dL (ref >39)
LDL CALC: 106 mg/dL — AB (ref 0–99)
Triglycerides: 66 mg/dL (ref 0–149)
VLDL Cholesterol Cal: 13 mg/dL (ref 5–40)

## 2017-01-04 LAB — RPR: RPR Ser Ql: NONREACTIVE

## 2017-01-05 LAB — GC/CHLAMYDIA PROBE AMP
Chlamydia trachomatis, NAA: NEGATIVE
NEISSERIA GONORRHOEAE BY PCR: NEGATIVE

## 2017-01-06 ENCOUNTER — Encounter: Payer: Self-pay | Admitting: Family Medicine

## 2017-01-09 ENCOUNTER — Telehealth: Payer: Self-pay | Admitting: Family Medicine

## 2017-01-09 NOTE — Telephone Encounter (Signed)
Please read the letter note to the patient under the letter tab.

## 2017-01-09 NOTE — Telephone Encounter (Signed)
DR STALLING PLEASE REVIEW PT LABS AND CALL HER AT [phone]

## 2017-01-10 NOTE — Telephone Encounter (Signed)
l/m with nl results

## 2017-09-12 NOTE — L&D Delivery Note (Signed)
Delivery Note At 2:13 PM a viable female was delivered via VBAC, Spontaneous (Presentation:ROT with restitution to ROA).  APGAR: 8, 9; weight 3515g.   Placenta status:intact,spontaneous delivery with gentle cord traction .  Cord: three vessels  with the following complications: very loose single loop around right ankle.  Cord pH: Not collected  Anesthesia:  Lidocaine for perineal repair Episiotomy: None Lacerations:  2nd degree with repair Suture Repair: 3.0 vicryl Est. Blood Loss (mL):  352  Delivery: Called to room and patient was determined to be 10/100/+1 and grunting during contractions. Head delivered ROA with excellent maternal effort. No nuchal cord present. Shoulder and body delivered in usual fashion. Infant with spontaneous cry, placed on mother's abdomen, dried and stimulated. Cord clamped x 2 after two-minute delay and cut by FOB. Cord blood drawn. Placenta delivered spontaneously via [place] with gentle cord traction. Fundus firm with massage and Pitocin.   Mom to postpartum.  Baby to Couplet care / Skin to Skin.  Calvert Cantor, CNM 06/29/2018, 3:45 PM

## 2017-12-09 ENCOUNTER — Inpatient Hospital Stay (HOSPITAL_COMMUNITY): Payer: Self-pay

## 2017-12-09 ENCOUNTER — Inpatient Hospital Stay (HOSPITAL_COMMUNITY)
Admission: AD | Admit: 2017-12-09 | Discharge: 2017-12-09 | Disposition: A | Discharge status: Home / Self Care | Payer: Self-pay | Source: Ambulatory Visit | Attending: Obstetrics and Gynecology | Admitting: Obstetrics and Gynecology

## 2017-12-09 ENCOUNTER — Encounter (HOSPITAL_COMMUNITY): Payer: Self-pay

## 2017-12-09 DIAGNOSIS — N83209 Unspecified ovarian cyst, unspecified side: Secondary | ICD-10-CM | POA: Insufficient documentation

## 2017-12-09 DIAGNOSIS — R109 Unspecified abdominal pain: Secondary | ICD-10-CM

## 2017-12-09 DIAGNOSIS — O26899 Other specified pregnancy related conditions, unspecified trimester: Secondary | ICD-10-CM

## 2017-12-09 DIAGNOSIS — O208 Other hemorrhage in early pregnancy: Secondary | ICD-10-CM

## 2017-12-09 DIAGNOSIS — Z3491 Encounter for supervision of normal pregnancy, unspecified, first trimester: Secondary | ICD-10-CM

## 2017-12-09 DIAGNOSIS — Z3A09 9 weeks gestation of pregnancy: Secondary | ICD-10-CM | POA: Insufficient documentation

## 2017-12-09 DIAGNOSIS — O468X1 Other antepartum hemorrhage, first trimester: Secondary | ICD-10-CM

## 2017-12-09 DIAGNOSIS — O209 Hemorrhage in early pregnancy, unspecified: Secondary | ICD-10-CM

## 2017-12-09 DIAGNOSIS — O418X10 Other specified disorders of amniotic fluid and membranes, first trimester, not applicable or unspecified: Secondary | ICD-10-CM

## 2017-12-09 DIAGNOSIS — O3481 Maternal care for other abnormalities of pelvic organs, first trimester: Secondary | ICD-10-CM | POA: Insufficient documentation

## 2017-12-09 LAB — URINALYSIS, ROUTINE W REFLEX MICROSCOPIC
Bilirubin Urine: NEGATIVE
GLUCOSE, UA: NEGATIVE mg/dL
Ketones, ur: NEGATIVE mg/dL
Nitrite: NEGATIVE
PROTEIN: NEGATIVE mg/dL
Specific Gravity, Urine: 1.016 (ref 1.005–1.030)
pH: 7 (ref 5.0–8.0)

## 2017-12-09 LAB — ABO/RH: ABO/RH(D): O POS

## 2017-12-09 LAB — CBC
HCT: 35.6 % — ABNORMAL LOW (ref 36.0–46.0)
HEMOGLOBIN: 12 g/dL (ref 12.0–15.0)
MCH: 28.6 pg (ref 26.0–34.0)
MCHC: 33.7 g/dL (ref 30.0–36.0)
MCV: 84.8 fL (ref 78.0–100.0)
Platelets: 227 10*3/uL (ref 150–400)
RBC: 4.2 MIL/uL (ref 3.87–5.11)
RDW: 13.7 % (ref 11.5–15.5)
WBC: 8.4 10*3/uL (ref 4.0–10.5)

## 2017-12-09 LAB — POCT PREGNANCY, URINE: Preg Test, Ur: POSITIVE — AB

## 2017-12-09 LAB — WET PREP, GENITAL
Clue Cells Wet Prep HPF POC: NONE SEEN
Sperm: NONE SEEN
TRICH WET PREP: NONE SEEN
Yeast Wet Prep HPF POC: NONE SEEN

## 2017-12-09 LAB — HCG, QUANTITATIVE, PREGNANCY: HCG, BETA CHAIN, QUANT, S: 63727 m[IU]/mL — AB (ref <5)

## 2017-12-09 MED ORDER — PRENATAL PLUS 27-1 MG PO TABS: 1.0000 | ORAL_TABLET | Freq: Every day | ORAL | 0 refills | Status: DC

## 2017-12-09 NOTE — MAU Provider Note (Signed)
History     CSN: [id number]  Arrival date and time: 12/09/17 0730   First Provider Initiated Contact with Patient 12/09/17 [phone]    Pt's English not fluent. Partner assisted in translating.   Chief Complaint  Patient presents with  . Abdominal Pain   Kara Dominguez is a 37 y.o. [phone] at [redacted]w[redacted]d who reports first episode of vaginal bleeding.  States she saw pink streaks on toilet paper after wiping this morning. She has had associated intermittent left groin mild pain, since this morning.The pain is sharp and does not radiate. Denies cramps, dysuria or irritative vaginal discharge.     OB History  Gravida Para Term Preterm AB Living  5 4 3 1   4   SAB TAB Ectopic Multiple Live Births        0 4    # Outcome Date GA Lbr Len/2nd Weight Sex Delivery Anes PTL Lv  5 Current           4 Term 10/26/14 [redacted]w[redacted]d 05:50 / 00:39 9 lb 2.4 oz (4.15 kg) M Vag-Spont None  LIV  3 Preterm 05/29/09 104w0d  4 lb 3 oz (1.899 kg) M Vag-Spont None Y LIV  2 Term 03/01/00 [redacted]w[redacted]d  8 lb (3.629 kg) F CS-LTranv EPI N LIV  1 Term 12/15/96 [redacted]w[redacted]d  7 lb (3.175 kg) F Vag-Spont None N LIV     History reviewed. No pertinent past medical history.  Past Surgical History:  Procedure Laterality Date  . CESAREAN SECTION      History reviewed. No pertinent family history.  Social History   Tobacco Use  . Smoking status: Never Smoker  . Smokeless tobacco: Never Used  Substance Use Topics  . Alcohol use: No  . Drug use: No    Allergies: No Known Allergies  Medications Prior to Admission  Medication Sig Dispense Refill Last Dose  . norgestimate-ethinyl estradiol (ORTHO-CYCLEN,SPRINTEC,PREVIFEM) 0.25-35 MG-MCG tablet Take 1 tablet by mouth daily. 1 Package 11 Taking    Review of Systems  Constitutional: Positive for fatigue. Negative for fever.  Gastrointestinal: Positive for abdominal pain. Negative for constipation, diarrhea, nausea and vomiting.       Fleeting mild right groin discomfort began this  morning and associated with vaginal spotting.   Genitourinary: Positive for vaginal bleeding. Negative for dysuria, frequency, hematuria, pelvic pain, urgency, vaginal discharge and vaginal pain.  Musculoskeletal: Negative for back pain.  Neurological: Negative for light-headedness.  Psychiatric/Behavioral: The patient is nervous/anxious.    Physical Exam   Blood pressure 126/81, pulse 86, temperature 98.2 F (36.8 C), temperature source Oral, resp. rate 16, height 5\' 6"  (1.676 m), weight 204 lb (92.5 kg), last menstrual period 10/06/2017, currently breastfeeding.  Physical Exam  Constitutional: She is oriented to person, place, and time. She appears well-developed and well-nourished. No distress.  HENT:  Head: Normocephalic.  Eyes: Pupils are equal, round, and reactive to light.  Neck: Neck supple. No thyromegaly present.  Cardiovascular: Normal rate.  Respiratory: Effort normal.  GI: Soft. There is tenderness. There is no rebound and no guarding.  Mild-mod TTP left suprapubic and left groin  Genitourinary: No vaginal discharge found.  Genitourinary Comments: Speculum: NEFG; vagina with scant brown discharge; cx without lesions, no active bleeding Bimamual: No uterine tenderness, size c/w 10 wks; mild left adnexal tenderness, left adnexal fullness  Musculoskeletal: She exhibits no edema.  Neurological: She is alert and oriented to person, place, and time.  Skin: Skin is warm and dry.  Psychiatric: She has a  normal mood and affect. Her behavior is normal.    MAU Course  Procedures Results for orders placed or performed during the hospital encounter of 12/09/17 (from the past 24 hour(s))  Urinalysis, Routine w reflex microscopic     Status: Abnormal   Collection Time: 12/09/17  7:32 AM  Result Value Ref Range   Color, Urine YELLOW YELLOW   APPearance HAZY (A) CLEAR   Specific Gravity, Urine 1.016 1.005 - 1.030   pH 7.0 5.0 - 8.0   Glucose, UA NEGATIVE NEGATIVE mg/dL   Hgb  urine dipstick MODERATE (A) NEGATIVE   Bilirubin Urine NEGATIVE NEGATIVE   Ketones, ur NEGATIVE NEGATIVE mg/dL   Protein, ur NEGATIVE NEGATIVE mg/dL   Nitrite NEGATIVE NEGATIVE   Leukocytes, UA MODERATE (A) NEGATIVE   RBC / HPF 0-5 0 - 5 RBC/hpf   WBC, UA 6-30 0 - 5 WBC/hpf   Bacteria, UA RARE (A) NONE SEEN   Squamous Epithelial / LPF 0-5 (A) NONE SEEN   Mucus PRESENT   Pregnancy, urine POC     Status: Abnormal   Collection Time: 12/09/17  7:51 AM  Result Value Ref Range   Preg Test, Ur POSITIVE (A) NEGATIVE  Wet prep, genital     Status: Abnormal   Collection Time: 12/09/17  7:54 AM  Result Value Ref Range   Yeast Wet Prep HPF POC NONE SEEN NONE SEEN   Trich, Wet Prep NONE SEEN NONE SEEN   Clue Cells Wet Prep HPF POC NONE SEEN NONE SEEN   WBC, Wet Prep HPF POC MODERATE (A) NONE SEEN   Sperm NONE SEEN   CBC     Status: Abnormal   Collection Time: 12/09/17  8:00 AM  Result Value Ref Range   WBC 8.4 4.0 - 10.5 K/uL   RBC 4.20 3.87 - 5.11 MIL/uL   Hemoglobin 12.0 12.0 - 15.0 g/dL   HCT 11.935.6 (L) 14.736.0 - 82.946.0 %   MCV 84.8 78.0 - 100.0 fL   MCH 28.6 26.0 - 34.0 pg   MCHC 33.7 30.0 - 36.0 g/dL   RDW 56.213.7 13.011.5 - 86.515.5 %   Platelets 227 150 - 400 K/uL  hCG, quantitative, pregnancy     Status: Abnormal   Collection Time: 12/09/17  8:00 AM  Result Value Ref Range   hCG, Beta Chain, Quant, S 78,46963,727 (H) <5 mIU/mL  ABO/Rh     Status: None   Collection Time: 12/09/17  8:00 AM  Result Value Ref Range   ABO/RH(D)      O POS Performed at [facility], [address] [place], [place] [id number]    GC/CT sent Kore[place] Ob Less Than 14 Weeks With Ob Transvaginal  Result Date: 12/09/2017 CLINICAL DATA:  Abdominal pain.  Spotting. EXAM: OBSTETRIC <14 WK ULTRASOUND TECHNIQUE: Transabdominal ultrasound was performed for evaluation of the gestation as well as the maternal uterus and adnexal regions. COMPARISON:  None. FINDINGS: Intrauterine gestational sac: Single Yolk sac:  Visualized. Embryo:   Visualized. Cardiac Activity: Visualized. Heart Rate: 170 bpm MSD:   mm    w     d CRL:   28.6 mm   9 w 4 d                  [facility] Subchorionic hemorrhage: There is a moderate sized subchorionic hemorrhage. Maternal uterus/adnexae: There is a 6.2 x 4 x 3.7 cm cyst in the left adnexa. The ovaries are otherwise normal in appearance. IMPRESSION: 1.  Single live IUP. 2. Moderate subchorionic hemorrhage. 3. 6.2 x 4 x 3.7 cm cyst in the left adnexa. Whether this arises from the ovary or is a separate cystic structure in the adnexa is unclear. Recommend attention on follow-up. Electronically Signed   By: Gerome Sam III M.D   On: 12/09/2017 09:26   MDM Spanish interpreter here for review of US findings and discharge instructions  Assessment and Plan   1. Vaginal bleeding affecting early pregnancy   2. Abdominal pain affecting pregnancy   3. Viable pregnancy in first trimester   4. Subchorionic hemorrhage of placenta in first trimester, single or unspecified fetus   5. Ovarian cyst affecting pregnancy in first trimester, antepartum    Allergies as of 12/09/2017   No Known Allergies     Medication List    STOP taking these medications   norgestimate-ethinyl estradiol 0.25-35 MG-MCG tablet Commonly known as:  ORTHO-CYCLEN,SPRINTEC,PREVIFEM     TAKE these medications   prenatal vitamin w/FE, FA 27-1 MG Tabs tablet Take 1 tablet by mouth daily.      Follow-up Information    Department, [facility]. Schedule an appointment as soon as possible for a visit in 1 month(s).   Contact information: [address] [place] [address] [phone]           Jamonica Schoff CNM 12/09/2017, 8:07 AM

## 2017-12-09 NOTE — Discharge Instructions (Signed)
Dolor abdominal en el embarazo (Abdominal Pain During Pregnancy) El dolor abdominal es frecuente durante el embarazo. Generalmente no causa ningn dao. El dolor abdominal puede tener numerosas causas. Algunas causas son ms graves que otras. Ciertas causas de dolor abdominal durante el embarazo se diagnostican fcilmente. A veces, se tarda un tiempo para llegar al diagnstico. Otras veces la causa no se conoce. El dolor abdominal puede estar relacionado con Jerseyalguna alteracin del Pillsburyembarazo, o puede deberse a una causa totalmente diferente. Por este motivo, siempre consulte a su mdico cuando sienta molestias abdominales. INSTRUCCIONES PARA EL CUIDADO EN EL HOGAR  Est atenta al dolor para ver si hay cambios. Las siguientes indicaciones ayudarn a Psychologist, educationalaliviar cualquier Longs Drug Storesmolestia que pueda sentir:  No Chiropodisttenga relaciones sexuales y no coloque nada dentro de la vagina hasta que los sntomas hayan desaparecido completamente.  Descanse todo lo que pueda RadioShackhasta que el dolor se le haya calmado.  Si siente nuseas, beba lquidos claros. Evite los alimentos slidos mientras sienta malestar o tenga nuseas.  Tome slo medicamentos de venta libre o recetados, segn las indicaciones del mdico.  Cumpla con todas las visitas de control, segn le indique su mdico. SOLICITE ATENCIN MDICA DE INMEDIATO SI:  Tiene un sangrado, prdida de lquidos o elimina tejidos por la vagina.  El dolor o los clicos Clay Cityaumentan.  Tiene vmitos persistentes.  Comienza a Financial risk analystsentir dolor al orinar u Centex Corporationobserva sangre.  Tiene fiebre.  Nota que los movimientos del beb disminuyen.  Siente intensa debilidad o se marea.  Tiene dificultad para respirar con o sin dolor abdominal.  Siente un dolor de cabeza intenso junto al dolor abdominal.  Shelle Ironiene una secrecin vaginal anormal con dolor abdominal.  Tiene diarrea persistente.  El dolor abdominal sigue o empeora an despus de Field seismologisthacer reposo. ASEGRESE DE QUE:   Comprende estas  instrucciones.  Controlar su afeccin.  Recibir ayuda de inmediato si no mejora o si empeora. Esta informacin no tiene Theme park managercomo fin reemplazar el consejo del mdico. Asegrese de hacerle al mdico cualquier pregunta que tenga. Document Released: 08/29/2005 Document Revised: 12/21/2015 Elsevier Interactive Patient Education  2017 [employer]vier [employer]  Vaginal Bleeding During Pregnancy, First Trimester A small amount of bleeding (spotting) from the vagina is common in early pregnancy. Sometimes the bleeding is normal and is not a problem, and sometimes it is a sign of something serious. Be sure to tell your doctor about any bleeding from your vagina right away. Follow these instructions at home:  Watch your condition for any changes.  Follow your doctor's instructions about how active you can be.  If you are on bed rest: ? You may need to stay in bed and only get up to use the bathroom. ? You may be allowed to do some activities. ? If you need help, make plans for someone to help you.  Write down: ? The number of pads you use each day. ? How often you change pads. ? How soaked (saturated) your pads are.  Do not use tampons.  Do not douche.  Do not have sex or orgasms until your doctor says it is okay.  If you pass any tissue from your vagina, save the tissue so you can show it to your doctor.  Only take medicines as told by your doctor.  Do not take aspirin because it can make you bleed.  Keep all follow-up visits as told by your doctor. Contact a doctor if:  You bleed from your vagina.  You have cramps.  You have labor  pains.  You have a fever that does not go away after you take medicine. Get help right away if:  You have very bad cramps in your back or belly (abdomen).  You pass large clots or tissue from your vagina.  You bleed more.  You feel light-headed or weak.  You pass out (faint).  You have chills.  You are leaking fluid or have a gush of fluid from  your vagina.  You pass out while pooping (having a bowel movement). This information is not intended to replace advice given to you by your health care provider. Make sure you discuss any questions you have with your health care provider. Document Released: 01/13/2014 Document Revised: 02/04/2016 Document Reviewed: 05/06/2013 Elsevier Interactive Patient Education  [employer].

## 2017-12-09 NOTE — MAU Note (Signed)
Patient presents with onset of mild lower abdominal pain and spotting which started this morning.

## 2017-12-11 LAB — GC/CHLAMYDIA PROBE AMP (~~LOC~~) NOT AT ARMC
CHLAMYDIA, DNA PROBE: NEGATIVE
Neisseria Gonorrhea: NEGATIVE

## 2018-01-25 ENCOUNTER — Other Ambulatory Visit (INDEPENDENT_AMBULATORY_CARE_PROVIDER_SITE_OTHER): Payer: Self-pay

## 2018-01-25 DIAGNOSIS — Z3491 Encounter for supervision of normal pregnancy, unspecified, first trimester: Secondary | ICD-10-CM

## 2018-01-25 LAB — POCT URINALYSIS DIP (MANUAL ENTRY)
Bilirubin, UA: NEGATIVE
Blood, UA: NEGATIVE
GLUCOSE UA: NEGATIVE mg/dL
Ketones, POC UA: NEGATIVE mg/dL
NITRITE UA: NEGATIVE
Protein Ur, POC: NEGATIVE mg/dL
Spec Grav, UA: 1.02 (ref 1.010–1.025)
UROBILINOGEN UA: 0.2 U/dL
pH, UA: 7 (ref 5.0–8.0)

## 2018-01-25 LAB — POCT UA - MICROSCOPIC ONLY

## 2018-01-26 LAB — OBSTETRIC PANEL, INCLUDING HIV
ANTIBODY SCREEN: NEGATIVE
BASOS ABS: 0 10*3/uL (ref 0.0–0.2)
BASOS: 0 %
EOS (ABSOLUTE): 0.3 10*3/uL (ref 0.0–0.4)
Eos: 4 %
HEMATOCRIT: 36.5 % (ref 34.0–46.6)
HIV Screen 4th Generation wRfx: NONREACTIVE
Hemoglobin: 11.6 g/dL (ref 11.1–15.9)
Hepatitis B Surface Ag: NEGATIVE
IMMATURE GRANS (ABS): 0.2 10*3/uL — AB (ref 0.0–0.1)
Immature Granulocytes: 2 %
LYMPHS: 20 %
Lymphocytes Absolute: 1.8 10*3/uL (ref 0.7–3.1)
MCH: 28.2 pg (ref 26.6–33.0)
MCHC: 31.8 g/dL (ref 31.5–35.7)
MCV: 89 fL (ref 79–97)
MONOCYTES: 6 %
Monocytes Absolute: 0.5 10*3/uL (ref 0.1–0.9)
NEUTROS PCT: 68 %
Neutrophils Absolute: 6.1 10*3/uL (ref 1.4–7.0)
PLATELETS: 274 10*3/uL (ref 150–379)
RBC: 4.11 x10E6/uL (ref 3.77–5.28)
RDW: 14.5 % (ref 12.3–15.4)
RPR Ser Ql: NONREACTIVE
RUBELLA: 9.17 {index} (ref >0.99)
Rh Factor: POSITIVE
WBC: 9.1 10*3/uL (ref 3.4–10.8)

## 2018-01-27 LAB — URINE CULTURE, OB REFLEX

## 2018-01-27 LAB — CULTURE, OB URINE

## 2018-02-01 ENCOUNTER — Ambulatory Visit (INDEPENDENT_AMBULATORY_CARE_PROVIDER_SITE_OTHER): Payer: Self-pay | Admitting: Family Medicine

## 2018-02-01 ENCOUNTER — Encounter: Payer: Self-pay | Admitting: Family Medicine

## 2018-02-01 ENCOUNTER — Other Ambulatory Visit: Payer: Self-pay

## 2018-02-01 VITALS — BP 96/62 | HR 80 | Temp 97.7°F | Wt 208.0 lb

## 2018-02-01 DIAGNOSIS — Z8751 Personal history of pre-term labor: Secondary | ICD-10-CM

## 2018-02-01 DIAGNOSIS — O34219 Maternal care for unspecified type scar from previous cesarean delivery: Secondary | ICD-10-CM | POA: Insufficient documentation

## 2018-02-01 DIAGNOSIS — Z98891 History of uterine scar from previous surgery: Secondary | ICD-10-CM

## 2018-02-01 DIAGNOSIS — R8271 Bacteriuria: Secondary | ICD-10-CM

## 2018-02-01 DIAGNOSIS — O0991 Supervision of high risk pregnancy, unspecified, first trimester: Secondary | ICD-10-CM

## 2018-02-01 DIAGNOSIS — O09522 Supervision of elderly multigravida, second trimester: Secondary | ICD-10-CM

## 2018-02-01 DIAGNOSIS — O0930 Supervision of pregnancy with insufficient antenatal care, unspecified trimester: Secondary | ICD-10-CM

## 2018-02-01 DIAGNOSIS — O09529 Supervision of elderly multigravida, unspecified trimester: Secondary | ICD-10-CM

## 2018-02-01 DIAGNOSIS — O0932 Supervision of pregnancy with insufficient antenatal care, second trimester: Secondary | ICD-10-CM

## 2018-02-01 DIAGNOSIS — Z3A16 16 weeks gestation of pregnancy: Secondary | ICD-10-CM

## 2018-02-01 DIAGNOSIS — O24419 Gestational diabetes mellitus in pregnancy, unspecified control: Secondary | ICD-10-CM

## 2018-02-01 LAB — POCT 1 HR PRENATAL GLUCOSE: Glucose 1 Hr Prenatal, POC: 214 mg/dL

## 2018-02-01 NOTE — Progress Notes (Signed)
   Kara Dominguez is a 37 y.o. yo [phone] at [redacted]w[redacted]d based on LMP who presents for her initial prenatal visit.  Her OB PMH includes GDM-A2, preterm delivery at [redacted]w[redacted]d, C-section in 2001 due to [place]GA [place] with successful VBAC x 2, shoulder dystocia with VBAC in 2016 (9 pounds 2.4 ounces infant).   Pregnancy is planned She reports morning sickness and nausea occasionally. Denies vaginal bleeding, discharge, no breast tenderness, contractions.  She is taking PNV. See flow sheet for details.  PMH, POBH, FH, meds, allergies and Social Hx reviewed.  Prenatal exam:Gen: Well nourished, well developed.  No distress.  Vitals noted. HEENT: Normocephalic, atraumatic.  Neck supple without cervical lymphadenopathy, thyromegaly or thyroid nodules.  fair dentition with fillings. CV: RRR no murmur, gallops or rubs Lungs: CTAB.  Normal respiratory effort without wheezes or rales. Abd: Soft, NTND. +BS. Uterus not appreciated above pelvis. GU: Normal external female genitalia without lesions.  Nl vaginal, well rugated without lesions. No vaginal discharge.  Bimanual exam: No adnexal mass or TTP. No CMT.  Uterus size consistent with 16 week pregnancy Ext: No clubbing, cyanosis or edema. Psych: Normal grooming and dress.  Not depressed or anxious appearing.  Normal thought content and process without flight of ideas or looseness of associations   Assessment/Plan:  1. [redacted] weeks gestation of pregnancy Current pregnancy issues include newly diagnosed GDM today Dating is reliable Prenatal labs reviewed, notable for bacteriuria with lactobacillus. Bleeding and pain precautions reviewed. Importance of prenatal vitamins reviewed.  Genetic screening offered.  Early glucola is indicated.  Cervicovaginal ancillary only Cytology - PAP([place])  2. Gestational diabetes mellitus (GDM): Early 1 hour GTT failed today, glucose 214 - POCT 1 Hr Prenatal Glucose - Discussed eliminating all sugary beverages  and decreasing sugar/carbohydrate rich foods - Ambulatory referral to Obstetrics / Gynecology- urgent  3. History of preterm delivery: History of preterm spontaneous VBAC at 33 weeks 0 days in 2010 with her third child.  Baby was born 4 pounds 3 ounces - Ambulatory referral to Obstetrics / Gynecology- urgent  4. Late prenatal care: [redacted]w[redacted]d based on LMP -Continue prenatal care  5. Bacteriuria: Urine culture growing > 100,000 CFU of lactobacillus.  Unclear if this is actually a UTI as this can be normal vaginal flora -Repeat culture, OB Urine -We will hold off on antibiotics at this time  6. History of Cesarean section: [place] in June 2001 with 2nd baby, secondary to [facility] per mother's report. Has had VBAC x 2 since then in 2010 and 2016.   7. Maternal age 42+, multigravida, antepartum   Visit precepted with Dr. Luiz Iron, MD [facility], PGY-3

## 2018-02-01 NOTE — Patient Instructions (Signed)
Cuidados prenatales (Prenatal Care) QU SON LOS CUIDADOS PRENATALES? Los cuidados prenatales son [place] se brindan a una embarazada antes del [place]. Los cuidados prenatales garantizan que la embarazada y el feto estn tan sanos como sea posible durante todo el [place]. Pueden brindar [place] tipo de cuidados [place], un mdico de atencin primaria o un especialista en parto y Media planner ([place]). Los cuidados prenatales incluyen exmenes fsicos, estudios, tratamientos e informacin sobre nutricin, estilo de vida y servicios de apoyo social. POR QU SON TAN IMPORTANTES LOS CUIDADOS PRENATALES? Los cuidados prenatales recibidos desde un inicio y de forma peridica aumentan la probabilidad de que usted y el beb permanezcan sanos durante todo el [place]. Este tipo de cuidados tambin reduce el riesgo de que el beb nazca mucho antes de la fecha probable de parto (prematuro) o de que sea ms pequeo de lo previsto (pequeo para la edad gestacional). Durante las visitas prenatales, se Programme researcher, broadcasting/film/video clase de enfermedad preexistente que usted pueda tener y que represente un riesgo durante el Media planner. Tambin la monitorearn con regularidad para Actuary afeccin que pueda surgir Solicitor, a fin de tratarla con rapidez y eficacia. QU SUCEDE DURANTE LAS VISITAS PRENATALES? Las visitas prenatales pueden incluir lo siguiente: [place] Informe al mdico cualquier signo o sntoma nuevo que haya tenido desde la ltima visita. Estos pueden incluir los siguientes:  Nuseas o vmitos.  Aumento o disminucin del nivel de [place].  Dificultad para dormir.  Dolor en la espalda o las piernas.  Cambios en [employer].  Ganas frecuentes de Garment/textile technologist.  Falta de aire al realizar actividad fsica.  Cambios en la piel, por ejemplo, una erupcin cutnea o picazn.  Sangrado o flujo vaginal.  Sensacin de excitacin o nerviosismo.  Cambios en los movimientos del feto. Es  conveniente que escriba cualquier pregunta o tema del que quiera hablar con el mdico, para llevarlo anotado a la cita. Exmenes Durante la primera visita prenatal, es probable que le hagan un examen fsico completo. El Viacom revisar con frecuencia la vagina, el cuello del tero y la posicin del tero, adems de examinarle el corazn, los pulmones y otras partes del cuerpo. A medida que el embarazo avance, el mdico medir el tamao del tero y Oncologist posicin del feto dentro del tero. Tambin puede examinarla para [employer] primeros signos del [place] de [place]. Las visitas prenatales tambin pueden incluir el control de la presin arterial y, despus de 10 a 77semanas de embarazo, aproximadamente, el control de los latidos del feto. Estudios Los estudios habituales suelen incluir lo siguiente:  Anlisis de [place]. Este anlisis examina la presencia de glucosa, protenas o signos de infeccin en la orina.  Recuento sanguneo. Este anlisis verifica el nivel de glbulos rojos y blancos en el organismo.  Pruebas de enfermedades de transmisin sexual (ETS). Las pruebas de Programme researcher, broadcasting/film/video de ETS al comienzo del embarazo son Ardelia Mems prctica de rutina, y en muchos estados es obligacin practicarlas.  Anlisis de anticuerpos. La examinarn para ver si es inmune a determinadas enfermedades, como la [place], que puede afectar al feto en desarrollo.  Deteccin de glucosa. Entre la semana 24y 28de embarazo, le analizarn el nivel de glucemia para detectar signos de diabetes gestacional. Pueden recomendarle un anlisis de seguimiento.  Estreptococos del grupoB. Es comn encontrar estas bacterias dentro de la vagina. Este [employer] indicar al mdico si necesita darle un antibitico para reducir la cantidad de este tipo de bacterias en el cuerpo antes del Viola de parto  y Bug Tussle.  Ecografas. Alrededor de la semana 18a 20de embarazo, muchas embarazadas se hacen ecografas para evaluar la  salud del feto y Hydrographic surveyor cualquier anomala en el desarrollo.  Prueba del VIH (virus de inmunodeficiencia humana). Al comienzo del Media planner, le harn una prueba de deteccin del VIH. Si corre un riesgo alto de [place] VIH, pueden repetirle esta prueba durante el tercer trimestre del embarazo. Pueden indicarle otro tipo de estudios segn su edad, sus antecedentes mdicos personales o familiares, u otros factores. [place] PARA LOS CUIDADOS PRENATALES? El programa de control correspondiente a los cuidados prenatales depender de cualquier enfermedad que usted tenga desde antes del embarazo o que haya desarrollado durante el mismo. Si usted no tiene Fish farm manager, es probable que le hagan los siguientes controles:  Furniture conservator/restorer vez al mes durante los primeros 63mses de eLiberty  Dos veces al mes durante el sptimo y el octavo mes de eGap  Una vez a la sTyson Foodsnoveno mes de eMedia plannery hBig Stone Gap Si presenta signos de trabajo de parto prematuro u otros signos o sntomas preocupantes, es posible que deba ver al mdico con ms frecuencia. Consulte al mBeazer Homesprograma de cuidados prenatales ms adecuado para su caso. QU PUEDO HACER PARA QUE EL BEB Y YO ESTEMOS TAN SANOS COMO SEA POSIBLE DURANTE EL EMBARAZO?  Tome una vitamina prenatal que contenga 4055mrogramos (0,1,6OMde cido flico toUS AirwaysEl mdico tambin puede indicarle que tome vitaminas adicionales, como yodo, vitaminaD, hierro, cobre y zinc.  ToChoctawe 1500 a 200089me calcio todUS Airwayssde la [id number] embResearch scientist (medical)AseRensselaer menos que el mdico le indique otra cosa: ? Debe aplicarse la vacuna contra la difteria, el ttanos y la tosferina (Tdap) entre la [id number] embarazo, independientemente de la fecha en la que recibi la ltima vacuna Tdap. Esta vacuna ayuda a proteger al beb contra la tosferina  despus del nacimiento. ? Debe recibir una vacuna antigripal inactivada (IIV) anual como ayuda para protegerlos a usted y al beb de la gripe. Puede recibirla en cualquier momento del embarazo.  Siga una dieta bien equilibrada, que incluya lo siguiente: ? FruLambert Modyverduras frescas. ? Protenas magras. ? Alimentos con altStarwood Hotels calcio, comJacksonvilleogHannauesos duros y verduras de hojas color verde oscuro. ? Panes integrales.  No coma frutos de mar con alto contenido de mercurio, por ejemplo: ? Pez espada. ? [place]. ? [place]. ? [place]. ? Ms de 6onLyda Kalata atn por semana.  No coma lo siguiente: ? Carnes o huevos crudos o mal cocidos. ? Alimentos no pasteurizados, como quesos blandos (brie, azuMedford Lakesfeta), jugos y lecGratz Embutidos. ? Salchichas que no se cocinaron en agua hirviendo.  Beba suficiente agua para mantener la orina clara o de color amarillo plido. Para muchas mujeres, la cantidad es de 10 o ms vasos de 8onzas de aguNurse, children'sl hecho de mantenerse hidratada ayuda a que el feto reciba nutrientes y pueProduct manager inicio de contracciones uterinas prematuras.  No consuma ningn producto que contenga tabaco, como cigarrillos, tabaco de masHigher education careers advisercigPsychologist, sport and exercisei necesita ayuda para dejar de fumar, consulte al mdico.  No consuma bebidas que contengan alcohol. No se ha determinado que haya un nivel de consumo de alcohol que sea inocuo durante el embFlowing WellsNo consuma drogas. Estas pueden daar al feto en  desarrollo o causar un aborto espontneo.  Consulte al mdico o al farmacutico antes de tomar cualquier medicamento recetado o de venta libre, hierbas o suplementos.  Limite el consumo de cafena a no ms de 200mg por da.  Haga actividad fsica. A menos que el mdico le indique otra cosa, intente hacer 30minutos de ejercicio moderado la mayora de los das de la semana. No practique actividades de alto impacto, deportes de contacto o actividades  con alto riesgo de cadas, como equitacin o esqu extremo.  Descanse lo suficiente.  Evite todo aquello que aumente la temperatura corporal, como jacuzzis y saunas.  Si tiene un gato, no vace la bandeja sanitaria. Las bacterias presentes en las heces del gato pueden causar una infeccin llamada toxoplasmosis. Esta puede daar gravemente al feto.  Aljese de las sustancias qumicas como insecticidas, plomo y mercurio, y de los productos de limpieza o pinturas que contengan solventes.  No se saque ninguna radiografa, excepto si es necesaria por razones mdicas.  Tome una clase de preparacin para el parto y el amamantamiento. Pregntele al mdico si necesita una derivacin o una recomendacin.  Esta informacin no tiene como fin reemplazar el consejo del mdico. Asegrese de hacerle al mdico cualquier pregunta que tenga. Document Released: 02/15/2008 Document Revised: 12/21/2015 Document Reviewed: 11/13/2013 Elsevier Interactive Patient Education  2017 Elsevier Inc.  

## 2018-02-02 LAB — CERVICOVAGINAL ANCILLARY ONLY
CHLAMYDIA, DNA PROBE: NEGATIVE
NEISSERIA GONORRHEA: NEGATIVE

## 2018-02-06 LAB — CYTOLOGY - PAP
Diagnosis: NEGATIVE
HPV: NOT DETECTED

## 2018-02-07 LAB — URINE CULTURE, OB REFLEX

## 2018-02-07 LAB — CULTURE, OB URINE

## 2018-02-12 ENCOUNTER — Encounter: Payer: Self-pay | Admitting: Family Medicine

## 2018-03-05 ENCOUNTER — Encounter: Payer: Self-pay | Admitting: Family Medicine

## 2018-03-05 ENCOUNTER — Ambulatory Visit (INDEPENDENT_AMBULATORY_CARE_PROVIDER_SITE_OTHER): Payer: Self-pay | Admitting: Family Medicine

## 2018-03-05 VITALS — BP 127/66 | HR 91 | Wt 213.8 lb

## 2018-03-05 DIAGNOSIS — O09529 Supervision of elderly multigravida, unspecified trimester: Secondary | ICD-10-CM

## 2018-03-05 DIAGNOSIS — Z8751 Personal history of pre-term labor: Secondary | ICD-10-CM

## 2018-03-05 DIAGNOSIS — O24419 Gestational diabetes mellitus in pregnancy, unspecified control: Secondary | ICD-10-CM

## 2018-03-05 DIAGNOSIS — O0992 Supervision of high risk pregnancy, unspecified, second trimester: Secondary | ICD-10-CM

## 2018-03-05 DIAGNOSIS — Z98891 History of uterine scar from previous surgery: Secondary | ICD-10-CM

## 2018-03-05 DIAGNOSIS — O09212 Supervision of pregnancy with history of pre-term labor, second trimester: Secondary | ICD-10-CM

## 2018-03-05 MED ORDER — ACCU-CHEK NANO SMARTVIEW W/DEVICE KIT: 1.0000 | PACK | 0 refills | Status: DC

## 2018-03-05 MED ORDER — ACCU-CHEK FASTCLIX LANCETS MISC: 1.0000 [IU] | Freq: Four times a day (QID) | 12 refills | Status: DC

## 2018-03-05 MED ORDER — ASPIRIN EC 81 MG PO TBEC: 81.0000 mg | DELAYED_RELEASE_TABLET | Freq: Every day | ORAL | 2 refills | Status: DC

## 2018-03-05 MED ORDER — GLUCOSE BLOOD VI STRP: ORAL_STRIP | 12 refills | Status: DC

## 2018-03-05 NOTE — Progress Notes (Addendum)
Spanish Interpreter Raquel Carrolyn MeiersMora  Anatomy US scheduled for July 27th @ 1230.  Diabetes Education scheduled for July 26th @ 1600.  Pt notified.  Makena application faxed.

## 2018-03-05 NOTE — Progress Notes (Signed)
   PRENATAL VISIT NOTE  Subjective:  Kara Dominguez is a 37 y.o. [id number] at 58w3dbeing seen today for initial prenatal care.  Previously seen at [facility] office and referred here due to preterm delivery and failing 1hr GTT.  She is currently monitored for the following issues for this high-risk pregnancy and has History of preterm delivery; Supervision of high-risk pregnancy; Late prenatal care; Pyelectasis of fetus on prenatal ultrasound; Gestational diabetes mellitus (GDM); Maternal age 38+ multigravida, antepartum; Shoulder dystocia; Seborrheic dermatitis; and History of cesarean section on their problem list.  Patient reports no complaints.  Contractions: Not present. Vag. Bleeding: None.  Movement: Present. Denies leaking of fluid.   The following portions of the patient's history were reviewed and updated as appropriate: allergies, current medications, past family history, past medical history, past social history, past surgical history and problem list. Problem list updated.  Objective:   Vitals:   03/05/18 1438  BP: 127/66  Pulse: 91  Weight: 213 lb 12.8 oz (97 kg)    Fetal Status: Fetal Heart Rate (bpm): 143   Movement: Present     General:  Alert, oriented and cooperative. Patient is in no acute distress.  Skin: Skin is warm and dry. No rash noted.   Cardiovascular: Normal heart rate noted  Respiratory: Normal respiratory effort, no problems with respiration noted  Abdomen: Soft, gravid, appropriate for gestational age.  Pain/Pressure: Present     Pelvic: Cervical exam deferred        Extremities: Normal range of motion.  Edema: Trace  Mental Status: Normal mood and affect. Normal behavior. Normal judgment and thought content.   Assessment and Plan:  Pregnancy: [id number] 268w3d1. Supervision of high risk pregnancy in second trimester FHT and FH normal - Comp Met (CMET) - Protein / creatinine ratio, urine - [place]FM OB DETAIL +14 WK; Future  2. Gestational  diabetes mellitus (GDM) in second trimester, gestational diabetes method of control unspecified Start testing. Diabetes education referral. - Comp Met (CMET) - Protein / creatinine ratio, urine - [place]FM OB DETAIL +14 WK; Future  3. Maternal age 8974+multigravida, antepartum Discussed T13 testing - declined. - Comp Met (CMET) - Protein / creatinine ratio, urine - [place]FM OB DETAIL +14 WK; Future  4. History of preterm delivery Start 17-P  5. History of cesarean section 2 successful VBACs. Last one 30s shoulder dystocia.   Preterm labor symptoms and general obstetric precautions including but not limited to vaginal bleeding, contractions, leaking of fluid and fetal movement were reviewed in detail with the patient. Please refer to After Visit Summary for other counseling recommendations.  No follow-ups on file.  No future appointments.  JaTruett MainlandDO

## 2018-03-06 ENCOUNTER — Encounter: Payer: Self-pay | Admitting: *Deleted

## 2018-03-06 LAB — COMPREHENSIVE METABOLIC PANEL
ALK PHOS: 103 IU/L (ref 39–117)
ALT: 24 IU/L (ref 0–32)
AST: 27 IU/L (ref 0–40)
Albumin/Globulin Ratio: 1.4 (ref 1.2–2.2)
Albumin: 3.5 g/dL (ref 3.5–5.5)
BUN/Creatinine Ratio: 26 — ABNORMAL HIGH (ref 9–23)
BUN: 10 mg/dL (ref 6–20)
Bilirubin Total: <0.2 mg/dL (ref 0.0–1.2)
CO2: 21 mmol/L (ref 20–29)
CREATININE: 0.39 mg/dL — AB (ref 0.57–1.00)
Calcium: 9 mg/dL (ref 8.7–10.2)
Chloride: 104 mmol/L (ref 96–106)
GFR calc Af Amer: 154 mL/min/{1.73_m2} (ref >59)
GFR calc non Af Amer: 134 mL/min/{1.73_m2} (ref >59)
GLOBULIN, TOTAL: 2.5 g/dL (ref 1.5–4.5)
Glucose: 124 mg/dL — ABNORMAL HIGH (ref 65–99)
Potassium: 4.1 mmol/L (ref 3.5–5.2)
SODIUM: 138 mmol/L (ref 134–144)
Total Protein: 6 g/dL (ref 6.0–8.5)

## 2018-03-06 LAB — PROTEIN / CREATININE RATIO, URINE
CREATININE, UR: 56.3 mg/dL
PROTEIN UR: 5.2 mg/dL
Protein/Creat Ratio: 92 mg/g creat (ref 0–200)

## 2018-03-06 MED ORDER — PROGESTERONE MICRONIZED 200 MG PO CAPS: ORAL_CAPSULE | ORAL | 1 refills | Status: DC

## 2018-03-06 NOTE — Progress Notes (Addendum)
Will prescribe prometrium as patient unable to get Makena. 1 capsule vaginally every night. Patient did use this during last pregnancy. Please call the patient and notify. Spanish speaking.

## 2018-03-06 NOTE — Progress Notes (Signed)
[facility]  [facility] will not be able to help the [facility].  She is 20 6/7 weeks today.  Self pay patients must be less than [redacted] weeks gestation for [facility].

## 2018-03-06 NOTE — Addendum Note (Signed)
Addended by: Levie HeritageSTINSON, Jemiah Ellenburg J on: 03/06/2018 02:23 PM   Modules accepted: Orders

## 2018-03-07 ENCOUNTER — Ambulatory Visit: Payer: Self-pay

## 2018-03-07 ENCOUNTER — Encounter: Payer: Self-pay | Attending: Obstetrics & Gynecology | Admitting: *Deleted

## 2018-03-07 DIAGNOSIS — Z713 Dietary counseling and surveillance: Secondary | ICD-10-CM | POA: Insufficient documentation

## 2018-03-07 DIAGNOSIS — O24419 Gestational diabetes mellitus in pregnancy, unspecified control: Secondary | ICD-10-CM | POA: Insufficient documentation

## 2018-03-07 NOTE — Progress Notes (Signed)
  Patient was seen on 03/07/2018 for Gestational Diabetes self-management. EDD 07/13/2018. Patient states hitory of GDM with last pregnancy 3 years ago. Diet history obtained. Patient eats good variety of all food groups and beverages include water, juice, was drinking [place] chocolate milk but has stopped now.  The following learning objectives were met by the patient :   States the definition of Gestational Diabetes  States why dietary management is important in controlling blood glucose  Describes the effects of carbohydrates on blood glucose levels  Demonstrates ability to create a balanced meal plan  Demonstrates carbohydrate counting   States when to check blood glucose levels  Demonstrates proper blood glucose monitoring techniques  States the effect of stress and exercise on blood glucose levels  States the importance of limiting caffeine and abstaining from alcohol and smoking  Plan:  Aim for 3 Carb Choices per meal (45 grams) +/- 1 either way  Aim for 1-2 Carbs per snack Begin reading food labels for Total Carbohydrate of foods Consider  increasing your activity level by walking or other activity daily as tolerated Begin checking BG before breakfast and 2 hours after first bite of breakfast, lunch and dinner as directed by MD  Bring Log Book/Sheet to every medical appointment   Take medication if directed by MD  Patient already has a meter: Accu Chek And is testing pre breakfast and 1 hours each meal as directed by MD Review of Log Book shows: all BG's are elevated significantly FBG: 117-125 mg/dl Post meal BG: 125-297 with improved BG after her walk  She states she has an appointment with MD next week to assess BG control after education today.  Patient instructed to monitor glucose levels: FBS: 60 - 95 mg/dl 2 hour: <120 mg/dl  Patient received the following handouts: in Spanish  Nutrition Diabetes and Pregnancy  Carbohydrate Counting List  Patient will be  seen for follow-up as needed.

## 2018-03-07 NOTE — Progress Notes (Addendum)
I called patient with [employer] [phone] and a female answered and he said she can be reached at  [phone]. I asked him to tell her if we don't reach her to call our office back. I called patient  At this number and informed her she did not qualify for patient assistance program because she is too far along but the doctor has prescribed another medicine to help decrease chance of preterm delivery. I notified her prometrium is at her pharmacy and is inserted vaginally and to cal us if she has questions. She voices understanding.

## 2018-03-08 ENCOUNTER — Encounter (HOSPITAL_COMMUNITY): Payer: Self-pay

## 2018-03-08 ENCOUNTER — Other Ambulatory Visit: Payer: Self-pay | Admitting: Family Medicine

## 2018-03-08 ENCOUNTER — Ambulatory Visit (HOSPITAL_COMMUNITY)
Admission: RE | Admit: 2018-03-08 | Discharge: 2018-03-08 | Disposition: A | Discharge status: Home / Self Care | Payer: Self-pay | Source: Ambulatory Visit | Attending: Family Medicine | Admitting: Family Medicine

## 2018-03-08 DIAGNOSIS — Z3A21 21 weeks gestation of pregnancy: Secondary | ICD-10-CM | POA: Insufficient documentation

## 2018-03-08 DIAGNOSIS — O24419 Gestational diabetes mellitus in pregnancy, unspecified control: Secondary | ICD-10-CM

## 2018-03-08 DIAGNOSIS — O0992 Supervision of high risk pregnancy, unspecified, second trimester: Secondary | ICD-10-CM

## 2018-03-08 DIAGNOSIS — O09529 Supervision of elderly multigravida, unspecified trimester: Secondary | ICD-10-CM

## 2018-03-08 DIAGNOSIS — Z3686 Encounter for antenatal screening for cervical length: Secondary | ICD-10-CM | POA: Insufficient documentation

## 2018-03-08 DIAGNOSIS — O99212 Obesity complicating pregnancy, second trimester: Secondary | ICD-10-CM | POA: Insufficient documentation

## 2018-03-08 DIAGNOSIS — O0932 Supervision of pregnancy with insufficient antenatal care, second trimester: Secondary | ICD-10-CM | POA: Insufficient documentation

## 2018-03-08 DIAGNOSIS — Z363 Encounter for antenatal screening for malformations: Secondary | ICD-10-CM | POA: Insufficient documentation

## 2018-03-08 DIAGNOSIS — O09212 Supervision of pregnancy with history of pre-term labor, second trimester: Secondary | ICD-10-CM

## 2018-03-08 DIAGNOSIS — O24410 Gestational diabetes mellitus in pregnancy, diet controlled: Secondary | ICD-10-CM | POA: Insufficient documentation

## 2018-03-08 DIAGNOSIS — O09522 Supervision of elderly multigravida, second trimester: Secondary | ICD-10-CM | POA: Insufficient documentation

## 2018-03-08 DIAGNOSIS — O34219 Maternal care for unspecified type scar from previous cesarean delivery: Secondary | ICD-10-CM | POA: Insufficient documentation

## 2018-03-21 ENCOUNTER — Ambulatory Visit (INDEPENDENT_AMBULATORY_CARE_PROVIDER_SITE_OTHER): Payer: Self-pay | Admitting: Obstetrics and Gynecology

## 2018-03-21 VITALS — BP 131/83 | Wt 218.9 lb

## 2018-03-21 DIAGNOSIS — O09212 Supervision of pregnancy with history of pre-term labor, second trimester: Secondary | ICD-10-CM

## 2018-03-21 DIAGNOSIS — O09529 Supervision of elderly multigravida, unspecified trimester: Secondary | ICD-10-CM

## 2018-03-21 DIAGNOSIS — Z98891 History of uterine scar from previous surgery: Secondary | ICD-10-CM

## 2018-03-21 DIAGNOSIS — Z8751 Personal history of pre-term labor: Secondary | ICD-10-CM

## 2018-03-21 DIAGNOSIS — O24419 Gestational diabetes mellitus in pregnancy, unspecified control: Secondary | ICD-10-CM

## 2018-03-21 DIAGNOSIS — O09522 Supervision of elderly multigravida, second trimester: Secondary | ICD-10-CM

## 2018-03-21 DIAGNOSIS — O0992 Supervision of high risk pregnancy, unspecified, second trimester: Secondary | ICD-10-CM

## 2018-03-21 MED ORDER — METFORMIN HCL 500 MG PO TABS: 500.0000 mg | ORAL_TABLET | Freq: Two times a day (BID) | ORAL | 1 refills | Status: DC

## 2018-03-21 MED ORDER — PROGESTERONE MICRONIZED 200 MG PO CAPS: ORAL_CAPSULE | ORAL | 1 refills | Status: DC

## 2018-03-21 NOTE — Patient Instructions (Signed)
Diagnstico de diabetes mellitus gestacional  (Gestational Diabetes Mellitus, Diagnosis)  La diabetes gestacional (diabetes mellitus gestacional) es una forma de diabetes a corto plazo (temporal) que puede aparecer durante el embarazo. Este cuadro desaparece despus del parto. Puede deberse a uno de estos problemas o a ambos:   El cuerpo no produce la cantidad suficiente de una hormona llamada insulina.   El cuerpo no responde de forma normal a la insulina que produce.  La insulina permite que los ciertos azcares (glucosa) ingresen a las clulas del cuerpo. Esto le proporciona la energa. Cuando se tiene diabetes, la glucosa no puede ingresar a las clulas. Esto produce un aumento del nivel de glucosa en la sangre (hiperglucemia).  Si la diabetes se trata, es posible que ni usted ni el beb se vean afectados. El mdico fijar los objetivos del tratamiento para usted. Generalmente, los resultados de la glucemia deben ser los siguientes:   Despus de no comer durante mucho tiempo (ayunar): 95mg/dl (5,3mmol/l).   Despus de las comidas (posprandial):  ? Una hora despus de una comida: igual o menor que 140mg/dl (7,8mmol/l).  ? Dos horas despus de una comida: igual o menor que 120mg/dl (6,7mmol/l).   Nivel de A1c (hemoglobinaA1c): del 6% al 6,5%.  CUIDADOS EN EL HOGAR  Preguntas para hacerle al mdico  Puede hacer las siguientes preguntas:   Debo reunirme con un instructor para el cuidado de la diabetes?   Dnde puedo encontrar un grupo de apoyo para personas diabticas?   Qu equipos necesitar para cuidarme en casa?   Qu medicamentos para la diabetes necesito? Cundo debo tomarlos?   Con qu frecuencia debo controlarme el nivel de glucosa en la sangre?   A qu nmero puedo llamar si tengo preguntas?   Cundo es la prxima cita con el mdico?  Instrucciones generales   Tome los medicamentos de venta libre y los recetados solamente como se lo haya indicado el mdico.   Mantenga un peso  saludable durante el embarazo.   Concurra a todas las visitas de control como se lo haya indicado el mdico. Esto es importante.  SOLICITE AYUDA SI:   Su nivel de glucosa en la sangre es igual o superior a 240mg/dl (13,3mmol/dl).   Su nivel de glucosa en la sangre es igual o superior a 200mg/dl (11,1mmol/l), y tiene cetonas en la orina.   Ha estado enferma o ha tenido fiebre durante 2o ms das y no mejora.   Si tiene alguno de estos problemas durante ms de 6horas:  ? No puede comer ni beber.  ? Siente malestar estomacal (nuseas).  ? Vomita.  ? La materia fecal es lquida (diarrea).  SOLICITE AYUDA DE INMEDIATO SI:   El nivel de glucosa en la sangre est por debajo de 54mg/dl (3mmol/l).   Est confundida.   Tiene dificultad para hacer lo siguiente:  ? Pensar con claridad.  ? Respirar.   El beb se mueve menos de lo normal.   Tiene los siguientes sntomas:  ? Niveles moderados o altos de cetonas en la orina.  ? Hemorragia vaginal.  ? Secrecin de un lquido fuera de lo comn de la vagina.  ? Contracciones prematuras. Estas pueden causar una sensacin de opresin en el vientre.  Esta informacin no tiene como fin reemplazar el consejo del mdico. Asegrese de hacerle al mdico cualquier pregunta que tenga.  Document Released: 12/21/2015 Document Revised: 12/21/2015 Document Reviewed: 10/02/2015  Elsevier Interactive Patient Education  2018 Elsevier Inc.

## 2018-03-24 NOTE — Progress Notes (Signed)
Subjective:  Kara Dominguez is a 37 Dominguez.o. [id number] at 7327w1d being seen today for ongoing prenatal care.  She is currently monitored for the following issues for this high-risk pregnancy and has History of preterm delivery; Supervision of high-risk pregnancy; Gestational diabetes mellitus (GDM); Maternal age 38+, multigravida, antepartum; Shoulder dystocia; Seborrheic dermatitis; and History of cesarean section on their problem list.  Patient reports no complaints.  Contractions: Not present. Vag. Bleeding: None.  Movement: Present. Denies leaking of fluid.   The following portions of the patient's history were reviewed and updated as appropriate: allergies, current medications, past family history, past medical history, past social history, past surgical history and problem list. Problem list updated.  Objective:   Vitals:   03/21/18 1705  BP: 131/83  Weight: 218 lb 14.4 oz (99.3 kg)    Fetal Status: Fetal Heart Rate (bpm): 157 Fundal Height: 26 cm Movement: Present     General:  Alert, oriented and cooperative. Patient is in no acute distress.  Skin: Skin is warm and dry. No rash noted.   Cardiovascular: Normal heart rate noted  Respiratory: Normal respiratory effort, no problems with respiration noted  Abdomen: Soft, gravid, appropriate for gestational age. Pain/Pressure: Absent     Pelvic: Vag. Bleeding: None     Cervical exam deferred        Extremities: Normal range of motion.  Edema: None  Mental Status: Normal mood and affect. Normal behavior. Normal judgment and thought content.   Urinalysis:      Assessment and Plan:  Pregnancy: [id number] at 7427w1d  1. Supervision of high risk pregnancy in second trimester Routine care.  2. History of preterm delivery Continue vaginal Prometrium. Last cervical length was normal.  3. Gestational diabetes mellitus (GDM) in second trimester, gestational diabetes method of control unspecified All sugars out of range at today's visit.  Fasting ranges 108-120 and postprandial 130-145. Patient not interested in insulin at this time. Believe she will likely need insulin if her sugars stay this uncontrolled. Have improved with DM education. [name] tart metformin. Patient counseled on importance of glucose control and exercise.   4. History of cesarean section C/s x1 with 2 successful VBACs. Plan for TOLAC. Needs to sign form.   5. Maternal age 38+, multigravida, antepartum   Preterm labor symptoms and general obstetric precautions including but not limited to vaginal bleeding, contractions, leaking of fluid and fetal movement were reviewed in detail with the patient. Please refer to After Visit Summary for other counseling recommendations.  Return in about 2 weeks (around 04/04/2018) for ob visit.   Pincus LargePhelps, Kara Gerard Y, DO

## 2018-04-06 ENCOUNTER — Ambulatory Visit (INDEPENDENT_AMBULATORY_CARE_PROVIDER_SITE_OTHER): Payer: Self-pay | Admitting: Obstetrics & Gynecology

## 2018-04-06 ENCOUNTER — Telehealth: Payer: Self-pay

## 2018-04-06 VITALS — BP 105/61 | HR 83 | Wt 216.9 lb

## 2018-04-06 DIAGNOSIS — O24119 Pre-existing diabetes mellitus, type 2, in pregnancy, unspecified trimester: Secondary | ICD-10-CM

## 2018-04-06 DIAGNOSIS — O0992 Supervision of high risk pregnancy, unspecified, second trimester: Secondary | ICD-10-CM

## 2018-04-06 HISTORY — DX: Pre-existing type 2 diabetes mellitus, in pregnancy, unspecified trimester: O24.119

## 2018-04-06 LAB — POCT URINALYSIS DIP (DEVICE)
Bilirubin Urine: NEGATIVE
Glucose, UA: NEGATIVE mg/dL
HGB URINE DIPSTICK: NEGATIVE
Nitrite: NEGATIVE
PH: 5.5 (ref 5.0–8.0)
Protein, ur: NEGATIVE mg/dL
SPECIFIC GRAVITY, URINE: 1.025 (ref 1.005–1.030)
Urobilinogen, UA: 0.2 mg/dL (ref 0.0–1.0)

## 2018-04-06 NOTE — Patient Instructions (Signed)
Insulin Aspart injection Qu es este medicamento? La INSULINA ASPART es una insulina de origen [place]. Este medicamento disminuye la cantidad de Banker. Este medicamento es una insulina de accin rpida que comienza a actuar ms pronto que la insulina regular. La duracin es ms corta que la insulina regular. Este medicamento puede ser utilizado para otros usos; si tiene alguna pregunta consulte con su proveedor de atencin mdica o con su Social research officer, government. MARCAS COMUNES: Fiasp, Fiasp AT&T, NovoLog, NovoLog Flexpen, NovoLog PenFill [employer] debo informar a mi profesional de la salud antes de tomar este medicamento? Necesitan saber si usted presenta alguno de los siguientes problemas o situaciones: episodios de bajo nivel de azcar en la sangre enfermedad renal enfermedad heptica una reaccin alrgica o inusual a la insulina, al metacresol, a otros medicamentos, alimentos, colorantes o conservantes si est embarazada o buscando quedar embarazada si est amamantando a un beb Cmo debo utilizar este medicamento? Este medicamento se administra mediante una inyeccin por va subcutnea. selo exactamente como se le indique. Es importante seguir las instrucciones de su profesional de la salud o su mdico. Si est usando Novolog, debe comenzar su comida dentro de los 5 a 10 minutos despus de la inyeccin. Si est [employer], debe comenzar su comida al momento de la inyeccin o dentro de los 20 minutos despus de la inyeccin. Tenga comida lista antes de la inyeccin. No se demore en empezar a comer. Le ensearn a usar PPL Corporation y a Dispensing optician dosis para actividades y [place]. No use ms insulina de la recetada. No use con mayor o menor frecuencia de la recetada. Siempre revise la apariencia de la insulina antes de usarla. Este medicamento debe ser transparente y sin color, como el [place]. No lo use si est turbio, espeso, con color o tiene partculas slidas. Es importante que  deseche las agujas y las jeringas usadas en un recipiente resistente a los pinchazos. No los deseche en la basura. Si no tiene un recipiente resistente a los pinchazos, consulte a Film/video editor o proveedor de atencin de la salud para obtenerlo. Hable con su pediatra para informarse acerca del uso de este medicamento en nios. Aunque Novolog se puede recetar a nios tan pequeos como de 2 aos de edad en casos selectos, existen precauciones que deben tomarse. Fiasp no est aprobado para uso en nios. Sobredosis: Pngase en contacto inmediatamente con un centro toxicolgico o una sala de urgencia si usted cree que haya tomado demasiado medicamento. ATENCIN: [employer] es solo para usted. No comparta este medicamento con nadie. Qu sucede si me olvido de una dosis? Es importante de no olvidar una dosis. Su profesional de la salud o su mdico debe hablar con usted acerca de algn plan para dosis olvidadas. Si olvida una dosis, siga su plan. No use dosis dobles. Qu puede interactuar con este medicamento? otros medicamentos para la diabetes Muchos medicamentos pueden aumentar o disminuir el nivel de Banker. Estos incluyen: bebidas con alcohol medicamentos antivirales para el VIH o SIDA aspirina y medicamentos tipo aspirina ciertos medicamentos para la depresin, ansiedad o trastornos psicticos cromo diurticos hormonas femeninas, como estrgenos o progestinas, y pldoras anticonceptivas medicamentos para el corazn isoniazida IMAO, tales como [place], [place], Patent attorney, [place] y [place] hormonas masculinas o esteroides anablicos medicamentos para bajar de peso medicamentos para alergias, asma, resfros o tos niacina AINE, medicamentos para Chief Technology Officer y la inflamacin, como ibuprofeno o naproxeno octreotida pentamidina fenitona probenecida antibiticos del grupo de [place],  tales como ciprofloxacino, levofloxacino y ofloxacino algunos suplementos dietticos a base de hierbas  medicamentos esteroideos, como la prednisona o la cortisona sulfametoxasol; trimetoprima medicamento para la tiroides Algunos medicamentos pueden ocultar los sntomas de advertencia de niveles bajos de Banker. Es posible que deba monitorear ms atentamente su nivel de azcar en la sangre si est tomando uno de estos medicamentos. Estos medicamentos incluyen: betabloqueadores, tales como atenolol, metoprolol, propranolol clonidina guanetidina reserpina Puede ser que esta lista no menciona todas las posibles interacciones. Informe a su profesional de [employer] de [employer] productos a base de hierbas, medicamentos de [place] o suplementos nutritivos que est tomando. Si usted fuma, consume bebidas alcohlicas o si utiliza drogas ilegales, indqueselo tambin a su profesional de [employer]. Algunas sustancias pueden interactuar con su medicamento. A qu debo estar atento al usar PPL Corporation? Visite a su mdico o a su profesional de la salud para chequear su evolucin peridicamente. Un examen llamado HbA1C (A1C) ser monitoreado. Es un simple examen de [place]. Mide su control de azcar en la sangre durante los ltimos 2 a 3 meses. Usted recibir Medtronic cada 3 a 6 meses. Aprenda cmo controlar el nivel de azcar en la sangre. Aprenda a reconocer los sntomas de bajo y alto nivel de azcar en la sangre y cmo tratarlos. Siempre lleve consigo una fuente rpida de azcar por si acaso experimenta sntomas de bajo nivel de azcar en la sangre. Ejemplos incluyen caramelos duros o tabletas de glucosa. Asegrese de que los miembros de su familia sepan que se puede ahogar si come o bebe mientras tiene sntomas graves de bajo nivel de azcar en la sangre, tales como convulsiones o prdida del conocimiento. Deben obtener ayuda mdica inmediatamente. Informe a su mdico o a su profesional de la salud si tiene alto nivel de International aid/development worker. Tal vez sea necesario cambiar la dosis de su medicamento. Si est  enfermo o haciendo mucho ms ejercicio que el habitual, puede ser necesario cambiar la dosis de su medicamento. No se salte comidas. Pregunte a su mdico o a su profesional de la salud si debe evitar el consumo de alcohol. Muchos productos de venta libre para tos y resfros contienen azcar y alcohol. Estos pueden Biochemist, clinical de azcar en la sangre. Asegrese que tiene la [place] correcta para el tipo de insulina que est utilizando. Trate de no cambiar de [place] o tipo de [place] o [place] a menos que as lo indica su mdico o su profesional de Radiographer, therapeutic. El [place] de [place] o tipos puede causar un alto o bajo nivel peligroso de Banker. Siempre lleve a mano un suministro adicional de [place], [place] y [place]. Utilice la jeringa sola una vez. Deseche la [place] y [place] en un envase cerrado para prevenir pinchazos accidentales de agujas. Nunca debe compartir las plumas o los cartuchos de [place]. Aun si cambia la aguja, compartiendo puede resultar en la propagacin de virus, tales como hepatitis o VIH. Use una pulsera o cadena de identificacin mdica. Lleve consigo una tarjeta de identificacin con informacin sobre su enfermedad y Engineer, manufacturing systems de sus medicamentos y los horarios de las dosis. Qu efectos secundarios puedo tener al [employer] este medicamento? Efectos secundarios que debe informar a su mdico o a Producer, television/film/video de la salud tan pronto como sea posible: Therapist, art, como erupcin cutnea, picazn o urticarias, e hinchazn de la cara, los labios o la lengua problemas respiratorios signos y sntomas de niveles elevados de azcar en  la [place], tales como [place], boca seca, piel seca, aliento frutal, nuseas, dolor de [place]ago, aumento del apetito o la sed, aumento de la frecuencia urinaria signos y sntomas de niveles bajos de International aid/development workerazcar en la sangre, tales como ansiedad, confusin, Countrysidemareos, aumento del apetito, debilidad o cansancio inusuales, sudoracin, temblores, fro,  irritabilidad, dolor de cabeza, visin borrosa, ritmo cardiaco rpido y prdida del conocimiento Efectos secundarios que generalmente no requieren atencin mdica (infrmelos a su mdico o a su profesional de la salud si persisten o si son molestos): aumento o disminucin del tejido adiposo debajo de la piel debido al uso excesivo de un lugar de inyeccin en particular picazn, ardor, hinchazn o erupcin en el lugar de la inyeccin Puede ser que esta lista no menciona todos los posibles efectos secundarios. Comunquese a su mdico por asesoramiento mdico Hewlett-Packardsobre los efectos secundarios. Usted puede informar los efectos secundarios a la FDA por telfono al 1-800-FDA-1088. Dnde debo guardar mi medicina? Mantenga fuera del alcance de los nios. Guarde los frascos de insulina sin abrir en el refrigerador a una temperatura de [place] 2 y 8 grados Celsius (36 y 1246 grados Fahrenheit). No congele o utilice si la insulina ha [place]estado congelada. Los frascos abiertos (frascos que est utilizando actualmente) pueden guardarse en el refrigerador o a Publishing rights managertemperatura ambiente, a aproximadamente 30 grados Celsius (86 grados Fahrenheit) o menos. Mantener su insulina a temperatura ambiente disminuye la cantidad de dolor que experimenta durante la inyeccin. Una vez abierta, la insulina se puede utilizar por [address] das. Despus de [address] das, deseche el frasco de Torreoninsulina. Guarde los cartuchos, los FlexPens de [place]Novolog, o los inyectores FlexTouch de Zur[place]Fiasp sin abrir en un refrigerador a una temperatura de [place] 2 y 8 grados Celsius (36 y 46 grados Fahrenheit). No los congele ni los utilice si la insulina ha [place]estado congelada. Una vez abiertos, los cartuchos y los FlexPen de Novalog que estn insertados en los inyectores deben mantenerse a Marketing executivetemperatura ambiente, aproximadamente a 25 grados Celsius (77 grados Fahrenheit) o menos por hasta [address] das; no los Set designerguarde en el refrigerador. El Hydrographic surveyorinyector FlexTouch de [place]Fiasp abierto puede guardarse a  Marketing executivetemperatura ambiente o refrigerado por hasta [address]28 das. Despus de [address]28 das, toda la insulina que no haya usado en cualquiera de esos productos abiertos deber desecharse. Proteja de la luz y del Market researchercalor excesivo. Deseche todo el medicamento sin usar despus de la fecha de vencimiento o despus de transcurrido el tiempo especfico de almacenamiento a Publishing rights managertemperatura ambiente. ATENCIN: Este folleto es un resumen. Puede ser que no cubra toda la posible informacin. Si usted tiene preguntas acerca de esta medicina, consulte con su mdico, su farmacutico o su profesional de Radiographer, therapeuticla salud.  2018 Elsevier/Gold Standard (2016-09-29 00:00:00)

## 2018-04-06 NOTE — Progress Notes (Signed)
   PRENATAL VISIT NOTE  Subjective:  Kara Dominguez is a 37 y.o. [phone] at 623w0d being seen today for ongoing prenatal care.  She is currently monitored for the following issues for this high-risk pregnancy and has History of preterm delivery; Supervision of high-risk pregnancy; Maternal age 37+, multigravida, antepartum; Shoulder dystocia; Seborrheic dermatitis; History of cesarean section; and Type 2 diabetes mellitus during pregnancy, antepartum on their problem list.  Patient reports no complaints.  Contractions: Not present. Vag. Bleeding: None.  Movement: Present. Denies leaking of fluid.   The following portions of the patient's history were reviewed and updated as appropriate: allergies, current medications, past family history, past medical history, past social history, past surgical history and problem list. Problem list updated.  Objective:   Vitals:   04/06/18 0844  BP: 105/61  Pulse: 83  Weight: 216 lb 14.4 oz (98.4 kg)    Fetal Status: Fetal Heart Rate (bpm): 142   Movement: Present     General:  Alert, oriented and cooperative. Patient is in no acute distress.  Skin: Skin is warm and dry. No rash noted.   Cardiovascular: Normal heart rate noted  Respiratory: Normal respiratory effort, no problems with respiration noted  Abdomen: Soft, gravid, appropriate for gestational age.  Pain/Pressure: Absent     Pelvic: Cervical exam deferred        Extremities: Normal range of motion.  Edema: None  Mental Status: Normal mood and affect. Normal behavior. Normal judgment and thought content.   Assessment and Plan:  Pregnancy: [id number] at [redacted]w[redacted]d  1. Supervision of high risk pregnancy in second trimester  - US Fetal Echocardiography; Future  2. Type 2 diabetes mellitus during pregnancy, antepartum Poor control on metformin, FBS100-130's and PP130-200, will start insulin therapy - US Fetal Echocardiography; Future - Ambulatory referral to Ophthalmology - Referral to  Nutrition and Diabetes Services - US MFM OB FOLLOW UP; Future  Preterm labor symptoms and general obstetric precautions including but not limited to vaginal bleeding, contractions, leaking of fluid and fetal movement were reviewed in detail with the patient. Please refer to After Visit Summary for other counseling recommendations.  Return in about 3 days (around 04/09/2018) for dm educ.  No future appointments.  Scheryl DarterJames Alashia Brownfield, MD

## 2018-04-06 NOTE — Progress Notes (Signed)
Patient Needs Lancets refilled.

## 2018-04-06 NOTE — Progress Notes (Signed)
[facility]  [facility].  Fetal Echo scheduled for August 15th @ 1100.   Pt notified.

## 2018-04-06 NOTE — Telephone Encounter (Signed)
Called pt with Spanish Interpreter Kara Dominguez and LM for pt that her Fetal Echo appt has been scheduled for August 15th @ 1100 along with [facility] [phone] and address to contact the office.  I also stated that if she has questions regarding the appt to please call the office.  Also placed a note in pt's next appt for reminder.

## 2018-04-10 ENCOUNTER — Ambulatory Visit: Payer: Self-pay | Admitting: *Deleted

## 2018-04-10 ENCOUNTER — Other Ambulatory Visit (HOSPITAL_COMMUNITY): Payer: Self-pay | Admitting: *Deleted

## 2018-04-10 ENCOUNTER — Encounter: Payer: Self-pay | Attending: Obstetrics & Gynecology | Admitting: *Deleted

## 2018-04-10 DIAGNOSIS — Z713 Dietary counseling and surveillance: Secondary | ICD-10-CM | POA: Insufficient documentation

## 2018-04-10 DIAGNOSIS — O24410 Gestational diabetes mellitus in pregnancy, diet controlled: Secondary | ICD-10-CM

## 2018-04-10 DIAGNOSIS — O24119 Pre-existing diabetes mellitus, type 2, in pregnancy, unspecified trimester: Secondary | ICD-10-CM

## 2018-04-10 DIAGNOSIS — O24419 Gestational diabetes mellitus in pregnancy, unspecified control: Secondary | ICD-10-CM | POA: Insufficient documentation

## 2018-04-10 MED ORDER — INSULIN NPH (HUMAN) (ISOPHANE) 100 UNIT/ML ~~LOC~~ SUSP: SUBCUTANEOUS | 3 refills | Status: DC

## 2018-04-10 MED ORDER — "INSULIN SYRINGE 29G X 1/2"" 1 ML MISC": 1.0000 | Freq: Two times a day (BID) | 5 refills | Status: DC

## 2018-04-10 NOTE — Progress Notes (Addendum)
Insulin Instruction  Patient was seen on 04/10/2018 for insulin instruction. Patient speaks Spanish, live interpretor here today.   Patient states her BG's have improved since starting Metformin (below 200 mg/dl now) but still reports fasting numbers of 120-129 and post meal numbers of 135-148 with last night being her highest at 171 mg/dl. She states she is taking only 1 Metformin with her breakfast, I clarified her Rx is for twice a day. She verbalized understanding to take 2nd dose with dinner meal.  I discussed blood sugars and insulin orders with Dr. Arlina Robes. Based on her current weight of 216 pounds (98 kg) at 0.8 units/kg her TDD should be 80 units. He ordered that her Metformin be discontinued and that she take 40 units ReliOn NPH before breakfast and 40 units at bedtime (9:00 PM)  The following learning objectives were met by the patient during this visit:   Insulin Action of NPH insulins  Reviewed syringe & vial including # units per syringe,   Hygiene and storage  Drawing up single and mixed doses if using vials   Single dose today, will instruct on mixing later if needed :   Rotation of Sites  Hypoglycemia- symptoms, causes , treatment choices  Record keeping and MD follow up (1 week)   Patient demonstrated understanding of insulin administration by return demonstration.  Patient received the following handouts:  Insulin Instruction Handout  BG Log Sheet                                         Patient to start on insulin as Rx'd by MD  Patient will be seen for follow-up in 1 week with MD and with me as needed.

## 2018-04-13 ENCOUNTER — Other Ambulatory Visit (HOSPITAL_COMMUNITY): Payer: Self-pay | Admitting: Obstetrics and Gynecology

## 2018-04-13 ENCOUNTER — Ambulatory Visit (HOSPITAL_COMMUNITY)
Admission: RE | Admit: 2018-04-13 | Discharge: 2018-04-13 | Disposition: A | Discharge status: Home / Self Care | Payer: Self-pay | Source: Ambulatory Visit | Attending: Family Medicine | Admitting: Family Medicine

## 2018-04-13 ENCOUNTER — Encounter (HOSPITAL_COMMUNITY): Payer: Self-pay

## 2018-04-13 DIAGNOSIS — O09522 Supervision of elderly multigravida, second trimester: Secondary | ICD-10-CM

## 2018-04-13 DIAGNOSIS — O24414 Gestational diabetes mellitus in pregnancy, insulin controlled: Secondary | ICD-10-CM

## 2018-04-13 DIAGNOSIS — O99212 Obesity complicating pregnancy, second trimester: Secondary | ICD-10-CM

## 2018-04-13 DIAGNOSIS — O34219 Maternal care for unspecified type scar from previous cesarean delivery: Secondary | ICD-10-CM

## 2018-04-13 DIAGNOSIS — O24410 Gestational diabetes mellitus in pregnancy, diet controlled: Secondary | ICD-10-CM | POA: Insufficient documentation

## 2018-04-13 DIAGNOSIS — Z3A27 27 weeks gestation of pregnancy: Secondary | ICD-10-CM

## 2018-04-13 DIAGNOSIS — Z362 Encounter for other antenatal screening follow-up: Secondary | ICD-10-CM

## 2018-04-16 ENCOUNTER — Other Ambulatory Visit (HOSPITAL_COMMUNITY): Payer: Self-pay | Admitting: *Deleted

## 2018-04-16 DIAGNOSIS — O24414 Gestational diabetes mellitus in pregnancy, insulin controlled: Secondary | ICD-10-CM

## 2018-04-20 ENCOUNTER — Ambulatory Visit (INDEPENDENT_AMBULATORY_CARE_PROVIDER_SITE_OTHER): Payer: Self-pay | Admitting: Family Medicine

## 2018-04-20 VITALS — BP 118/62 | HR 83 | Wt 216.0 lb

## 2018-04-20 DIAGNOSIS — O24119 Pre-existing diabetes mellitus, type 2, in pregnancy, unspecified trimester: Secondary | ICD-10-CM

## 2018-04-20 DIAGNOSIS — O09523 Supervision of elderly multigravida, third trimester: Secondary | ICD-10-CM

## 2018-04-20 DIAGNOSIS — O0993 Supervision of high risk pregnancy, unspecified, third trimester: Secondary | ICD-10-CM

## 2018-04-20 DIAGNOSIS — O0992 Supervision of high risk pregnancy, unspecified, second trimester: Secondary | ICD-10-CM

## 2018-04-20 DIAGNOSIS — O24113 Pre-existing diabetes mellitus, type 2, in pregnancy, third trimester: Secondary | ICD-10-CM

## 2018-04-20 DIAGNOSIS — Z98891 History of uterine scar from previous surgery: Secondary | ICD-10-CM

## 2018-04-20 DIAGNOSIS — O09529 Supervision of elderly multigravida, unspecified trimester: Secondary | ICD-10-CM

## 2018-04-20 DIAGNOSIS — Z8751 Personal history of pre-term labor: Secondary | ICD-10-CM

## 2018-04-20 DIAGNOSIS — O09213 Supervision of pregnancy with history of pre-term labor, third trimester: Secondary | ICD-10-CM

## 2018-04-20 DIAGNOSIS — Z23 Encounter for immunization: Secondary | ICD-10-CM

## 2018-04-20 MED ORDER — INSULIN NPH (HUMAN) (ISOPHANE) 100 UNIT/ML ~~LOC~~ SUSP: SUBCUTANEOUS | 3 refills | Status: DC

## 2018-04-20 MED ORDER — INSULIN ASPART 100 UNIT/ML ~~LOC~~ SOLN: 7.0000 [IU] | Freq: Three times a day (TID) | SUBCUTANEOUS | 12 refills | Status: DC

## 2018-04-20 MED ORDER — INSULIN ASPART 100 UNIT/ML ~~LOC~~ SOLN: 10.0000 [IU] | Freq: Three times a day (TID) | SUBCUTANEOUS | 12 refills | Status: DC

## 2018-04-20 NOTE — Progress Notes (Signed)
Subjective:  Kara Dominguez is a 37 y.o. [phone] at 3959w0d being seen today for ongoing prenatal care.  She is currently monitored for the following issues for this high-risk pregnancy and has History of preterm delivery; Supervision of high-risk pregnancy; Maternal age 37+, multigravida, antepartum; Shoulder dystocia; Seborrheic dermatitis; History of cesarean section; and Type 2 diabetes mellitus during pregnancy, antepartum on their problem list.  GDM: Patient taking Insulin NPH 40 units BID.  Reports no hypoglycemic episodes.  Tolerating medication well Fasting: 80-114, 3 elevated values 2hr PP: 59-170, 14 elevated  Patient reports no complaints.  Contractions: Not present. Vag. Bleeding: None.  Movement: Present. Denies leaking of fluid.   The following portions of the patient's history were reviewed and updated as appropriate: allergies, current medications, past family history, past medical history, past social history, past surgical history and problem list. Problem list updated.  Objective:   Vitals:   04/20/18 1126  BP: 118/62  Pulse: 83  Weight: 216 lb (98 kg)    Fetal Status: Fetal Heart Rate (bpm): 154   Movement: Present     General:  Alert, oriented and cooperative. Patient is in no acute distress.  Skin: Skin is warm and dry. No rash noted.   Cardiovascular: Normal heart rate noted  Respiratory: Normal respiratory effort, no problems with respiration noted  Abdomen: Soft, gravid, appropriate for gestational age. Pain/Pressure: Present     Pelvic: Vag. Bleeding: None     Cervical exam deferred        Extremities: Normal range of motion.  Edema: Trace  Mental Status: Normal mood and affect. Normal behavior. Normal judgment and thought content.   Urinalysis:      Assessment and Plan:  Pregnancy: [id number] at 6759w0d  1. Supervision of high risk pregnancy in second trimester FHT normal - HIV antibody - CBC - RPR  2. Need for Tdap vaccination - Tdap vaccine  greater than or equal to 7yo IM  3. Type 2 diabetes mellitus during pregnancy, antepartum US growth on 8/2 EFW 1372g (85%, AC >97%) Will need antenatal testing. Decrease AM NPH to 30 units and start Aspart 10units ac  4. History of preterm delivery On prometrium  5. Maternal age 37+, multigravida, antepartum   6. History of cesarean section H/o of 2 VBACs   Preterm labor symptoms and general obstetric precautions including but not limited to vaginal bleeding, contractions, leaking of fluid and fetal movement were reviewed in detail with the patient. Please refer to After Visit Summary for other counseling recommendations.  No follow-ups on file.   Levie HeritageStinson, Kara Arenson J, DO

## 2018-04-20 NOTE — Patient Instructions (Addendum)
Insulin NPH 30 unidades antes del desayuno y 40 unidades antes de acostarse  Insulin Aspart 10 unidades antes de cada comida

## 2018-04-21 LAB — CBC
Hematocrit: 34.6 % (ref 34.0–46.6)
Hemoglobin: 11.6 g/dL (ref 11.1–15.9)
MCH: 28.9 pg (ref 26.6–33.0)
MCHC: 33.5 g/dL (ref 31.5–35.7)
MCV: 86 fL (ref 79–97)
Platelets: 287 10*3/uL (ref 150–450)
RBC: 4.02 x10E6/uL (ref 3.77–5.28)
RDW: 14.3 % (ref 12.3–15.4)
WBC: 9.2 10*3/uL (ref 3.4–10.8)

## 2018-04-21 LAB — RPR: RPR Ser Ql: NONREACTIVE

## 2018-04-21 LAB — HIV ANTIBODY (ROUTINE TESTING W REFLEX): HIV SCREEN 4TH GENERATION: NONREACTIVE

## 2018-05-04 ENCOUNTER — Ambulatory Visit (INDEPENDENT_AMBULATORY_CARE_PROVIDER_SITE_OTHER): Payer: Self-pay | Admitting: Obstetrics & Gynecology

## 2018-05-04 VITALS — BP 103/57 | HR 88 | Wt 216.0 lb

## 2018-05-04 DIAGNOSIS — O24119 Pre-existing diabetes mellitus, type 2, in pregnancy, unspecified trimester: Secondary | ICD-10-CM

## 2018-05-04 DIAGNOSIS — O99210 Obesity complicating pregnancy, unspecified trimester: Secondary | ICD-10-CM | POA: Insufficient documentation

## 2018-05-04 DIAGNOSIS — O0993 Supervision of high risk pregnancy, unspecified, third trimester: Secondary | ICD-10-CM

## 2018-05-04 DIAGNOSIS — O09529 Supervision of elderly multigravida, unspecified trimester: Secondary | ICD-10-CM

## 2018-05-04 DIAGNOSIS — Z98891 History of uterine scar from previous surgery: Secondary | ICD-10-CM

## 2018-05-04 HISTORY — DX: Obesity complicating pregnancy, unspecified trimester: O99.210

## 2018-05-04 NOTE — Progress Notes (Signed)
   PRENATAL VISIT NOTE  Subjective:  Kara Dominguez is a 37 y.o. [phone] at 1740w0d being seen today for ongoing prenatal care.  She is currently monitored for the following issues for this high-risk pregnancy and has History of preterm delivery; Supervision of high-risk pregnancy; Maternal age 37+, multigravida, antepartum; Shoulder dystocia; Seborrheic dermatitis; History of cesarean section; Type 2 diabetes mellitus during pregnancy, antepartum; and Obesity in pregnancy on their problem list.  Patient reports no complaints.  Contractions: Irritability. Vag. Bleeding: None.  Movement: Present. Denies leaking of fluid.   The following portions of the patient's history were reviewed and updated as appropriate: allergies, current medications, past family history, past medical history, past social history, past surgical history and problem list. Problem list updated.  Objective:   Vitals:   05/04/18 1121  BP: (!) 103/57  Pulse: 88  Weight: 216 lb (98 kg)    Fetal Status:     Movement: Present     General:  Alert, oriented and cooperative. Patient is in no acute distress.  Skin: Skin is warm and dry. No rash noted.   Cardiovascular: Normal heart rate noted  Respiratory: Normal respiratory effort, no problems with respiration noted  Abdomen: Soft, gravid, appropriate for gestational age.  Pain/Pressure: Absent     Pelvic: Cervical exam deferred        Extremities: Normal range of motion.  Edema: None  Mental Status: Normal mood and affect. Normal behavior. Normal judgment and thought content.   Assessment and Plan:  Pregnancy: [id number] at 7240w0d  1. Obesity in pregnancy - rec no more weight gain  2. Type 2 diabetes mellitus during pregnancy, antepartum - fastings still elevated - rec increase AM NPH from 30 to 34 - fetal echo normal  3. Supervision of high risk pregnancy in third trimester - follow up u/s 05-11-18  4. Maternal age 37+, multigravida, antepartum   5. History  of cesarean section -She has had 3 VBACs and wants a 4th  Preterm labor symptoms and general obstetric precautions including but not limited to vaginal bleeding, contractions, leaking of fluid and fetal movement were reviewed in detail with the patient. Please refer to After Visit Summary for other counseling recommendations.  No follow-ups on file.  Future Appointments  Date Time Provider Department Center  05/11/2018  1:00 PM [facility] US 3 [facility]    Allie BossierMyra C Gregoria Selvy, MD

## 2018-05-11 ENCOUNTER — Encounter (HOSPITAL_COMMUNITY): Payer: Self-pay

## 2018-05-11 ENCOUNTER — Ambulatory Visit (HOSPITAL_COMMUNITY)
Admission: RE | Admit: 2018-05-11 | Discharge: 2018-05-11 | Disposition: A | Discharge status: Home / Self Care | Payer: Self-pay | Source: Ambulatory Visit | Attending: Family Medicine | Admitting: Family Medicine

## 2018-05-11 DIAGNOSIS — O24113 Pre-existing diabetes mellitus, type 2, in pregnancy, third trimester: Secondary | ICD-10-CM | POA: Insufficient documentation

## 2018-05-11 DIAGNOSIS — O34219 Maternal care for unspecified type scar from previous cesarean delivery: Secondary | ICD-10-CM

## 2018-05-11 DIAGNOSIS — O09523 Supervision of elderly multigravida, third trimester: Secondary | ICD-10-CM

## 2018-05-11 DIAGNOSIS — O99210 Obesity complicating pregnancy, unspecified trimester: Secondary | ICD-10-CM

## 2018-05-11 DIAGNOSIS — Z3A31 31 weeks gestation of pregnancy: Secondary | ICD-10-CM | POA: Insufficient documentation

## 2018-05-11 DIAGNOSIS — O99213 Obesity complicating pregnancy, third trimester: Secondary | ICD-10-CM

## 2018-05-11 DIAGNOSIS — O0933 Supervision of pregnancy with insufficient antenatal care, third trimester: Secondary | ICD-10-CM

## 2018-05-11 DIAGNOSIS — O24119 Pre-existing diabetes mellitus, type 2, in pregnancy, unspecified trimester: Secondary | ICD-10-CM

## 2018-05-15 ENCOUNTER — Other Ambulatory Visit (HOSPITAL_COMMUNITY): Payer: Self-pay | Admitting: *Deleted

## 2018-05-15 DIAGNOSIS — O24414 Gestational diabetes mellitus in pregnancy, insulin controlled: Secondary | ICD-10-CM

## 2018-05-18 ENCOUNTER — Encounter: Payer: Self-pay | Admitting: *Deleted

## 2018-05-18 ENCOUNTER — Ambulatory Visit (INDEPENDENT_AMBULATORY_CARE_PROVIDER_SITE_OTHER): Payer: Self-pay | Admitting: *Deleted

## 2018-05-18 ENCOUNTER — Ambulatory Visit (INDEPENDENT_AMBULATORY_CARE_PROVIDER_SITE_OTHER): Payer: Self-pay | Admitting: Obstetrics & Gynecology

## 2018-05-18 ENCOUNTER — Ambulatory Visit: Payer: Self-pay

## 2018-05-18 VITALS — BP 113/59 | HR 82 | Wt 218.2 lb

## 2018-05-18 DIAGNOSIS — O09523 Supervision of elderly multigravida, third trimester: Secondary | ICD-10-CM

## 2018-05-18 DIAGNOSIS — O24113 Pre-existing diabetes mellitus, type 2, in pregnancy, third trimester: Secondary | ICD-10-CM

## 2018-05-18 DIAGNOSIS — Z98891 History of uterine scar from previous surgery: Secondary | ICD-10-CM

## 2018-05-18 DIAGNOSIS — O0993 Supervision of high risk pregnancy, unspecified, third trimester: Secondary | ICD-10-CM

## 2018-05-18 DIAGNOSIS — O24119 Pre-existing diabetes mellitus, type 2, in pregnancy, unspecified trimester: Secondary | ICD-10-CM

## 2018-05-18 DIAGNOSIS — O09529 Supervision of elderly multigravida, unspecified trimester: Secondary | ICD-10-CM

## 2018-05-18 MED ORDER — INSULIN ASPART 100 UNIT/ML ~~LOC~~ SOLN: 12.0000 [IU] | Freq: Three times a day (TID) | SUBCUTANEOUS | 12 refills | Status: DC

## 2018-05-18 MED ORDER — INSULIN NPH (HUMAN) (ISOPHANE) 100 UNIT/ML ~~LOC~~ SUSP
SUBCUTANEOUS | 3 refills | Status: DC
Start: 2018-05-18 — End: 2018-06-08

## 2018-05-18 NOTE — Progress Notes (Signed)
Interpreter Jamesetta Orleans present for encounter. [place] for growth done 8/30. Next scheduled on 9/27, BPP added

## 2018-05-18 NOTE — Patient Instructions (Signed)

## 2018-05-18 NOTE — Progress Notes (Signed)
   PRENATAL VISIT NOTE  Subjective:  Kara Dominguez is a 37 y.o. [phone] at [redacted]w[redacted]d being seen today for ongoing prenatal care.  She is currently monitored for the following issues for this high-risk pregnancy and has History of preterm delivery; Supervision of high-risk pregnancy; Maternal age 58+, multigravida, antepartum; Shoulder dystocia; Seborrheic dermatitis; History of cesarean section; Type 2 diabetes mellitus during pregnancy, antepartum; and Obesity in pregnancy on their problem list.  Patient reports no complaints.  Contractions: Irritability. Vag. Bleeding: None.  Movement: Present. Denies leaking of fluid.   The following portions of the patient's history were reviewed and updated as appropriate: allergies, current medications, past family history, past medical history, past social history, past surgical history and problem list. Problem list updated.  Objective:   Vitals:   05/18/18 0930  BP: (!) 113/59  Pulse: 82  Weight: 218 lb 3.2 oz (99 kg)    Fetal Status: Fetal Heart Rate (bpm): NST   Movement: Present     General:  Alert, oriented and cooperative. Patient is in no acute distress.  Skin: Skin is warm and dry. No rash noted.   Cardiovascular: Normal heart rate noted  Respiratory: Normal respiratory effort, no problems with respiration noted  Abdomen: Soft, gravid, appropriate for gestational age.  Pain/Pressure: Present     Pelvic: Cervical exam deferred        Extremities: Normal range of motion.  Edema: None  Mental Status: Normal mood and affect. Normal behavior. Normal judgment and thought content.   Assessment and Plan:  Pregnancy: [id number] at [redacted]w[redacted]d  1. Supervision of high risk pregnancy in third trimester NST reactive and growth [place] nl  2. Type 2 diabetes mellitus during pregnancy, antepartum FBS normal , PP after lunch and supper 130-150's, insulin dose adjusted - insulin NPH Human (HUMULIN N,NOVOLIN N) 100 UNIT/ML injection; 34 units before  breakfast & 40 units at bedtime  Dispense: 10 mL; Refill: 3 - insulin aspart (NOVOLOG) 100 UNIT/ML injection; Inject 12 Units into the skin 3 (three) times daily before meals.  Dispense: 10 mL; Refill: 12  3. Maternal age 78+, multigravida, antepartum   4. History of cesarean section Plans VBAC  Preterm labor symptoms and general obstetric precautions including but not limited to vaginal bleeding, contractions, leaking of fluid and fetal movement were reviewed in detail with the patient. Please refer to After Visit Summary for other counseling recommendations.  Return in about 1 week (around 05/25/2018) for NST/BPP and HOB weekly.  Future Appointments  Date Time Provider Department Center  05/18/2018 10:15 AM Adam Phenix, MD [facility]  06/08/2018 11:00 AM [facility] [place] 3 [facility]    Scheryl Darter, MD

## 2018-05-18 NOTE — Progress Notes (Signed)

## 2018-05-25 ENCOUNTER — Ambulatory Visit: Payer: Self-pay

## 2018-05-25 ENCOUNTER — Ambulatory Visit (INDEPENDENT_AMBULATORY_CARE_PROVIDER_SITE_OTHER): Payer: Self-pay | Admitting: Obstetrics & Gynecology

## 2018-05-25 ENCOUNTER — Ambulatory Visit (INDEPENDENT_AMBULATORY_CARE_PROVIDER_SITE_OTHER): Payer: Self-pay | Admitting: *Deleted

## 2018-05-25 VITALS — BP 109/61 | HR 108 | Wt 219.7 lb

## 2018-05-25 DIAGNOSIS — O24119 Pre-existing diabetes mellitus, type 2, in pregnancy, unspecified trimester: Secondary | ICD-10-CM

## 2018-05-25 DIAGNOSIS — O24113 Pre-existing diabetes mellitus, type 2, in pregnancy, third trimester: Secondary | ICD-10-CM

## 2018-05-25 DIAGNOSIS — O99210 Obesity complicating pregnancy, unspecified trimester: Secondary | ICD-10-CM

## 2018-05-25 DIAGNOSIS — O09213 Supervision of pregnancy with history of pre-term labor, third trimester: Secondary | ICD-10-CM

## 2018-05-25 DIAGNOSIS — O09529 Supervision of elderly multigravida, unspecified trimester: Secondary | ICD-10-CM

## 2018-05-25 DIAGNOSIS — O09523 Supervision of elderly multigravida, third trimester: Secondary | ICD-10-CM

## 2018-05-25 DIAGNOSIS — O99213 Obesity complicating pregnancy, third trimester: Secondary | ICD-10-CM

## 2018-05-25 DIAGNOSIS — Z8751 Personal history of pre-term labor: Secondary | ICD-10-CM

## 2018-05-25 DIAGNOSIS — Z98891 History of uterine scar from previous surgery: Secondary | ICD-10-CM

## 2018-05-25 DIAGNOSIS — O0993 Supervision of high risk pregnancy, unspecified, third trimester: Secondary | ICD-10-CM

## 2018-05-25 NOTE — Progress Notes (Signed)
Live interpreter present for encounter.  

## 2018-05-25 NOTE — Progress Notes (Signed)
   PRENATAL VISIT NOTE  Subjective:  Kara Dominguez is a 37 y.o. [phone] at 7357w0d being seen today for ongoing prenatal care.  She is currently monitored for the following issues for this high-risk pregnancy and has History of preterm delivery; Supervision of high-risk pregnancy; Maternal age [age]+, multigravida, antepartum; Shoulder dystocia; Seborrheic dermatitis; History of cesarean section; Type 2 diabetes mellitus during pregnancy, antepartum; and Obesity in pregnancy on their problem list.  Patient reports no complaints.  Contractions: Irregular. Vag. Bleeding: None.  Movement: Present. Denies leaking of fluid.   The following portions of the patient's history were reviewed and updated as appropriate: allergies, current medications, past family history, past medical history, past social history, past surgical history and problem list. Problem list updated.  Objective:   Vitals:   05/25/18 1038  BP: 109/61  Pulse: (!) 108  Weight: 219 lb 11.2 oz (99.7 kg)    Fetal Status: Fetal Heart Rate (bpm): RNST   Movement: Present     General:  Alert, oriented and cooperative. Patient is in no acute distress.  Skin: Skin is warm and dry. No rash noted.   Cardiovascular: Normal heart rate noted  Respiratory: Normal respiratory effort, no problems with respiration noted  Abdomen: Soft, gravid, appropriate for gestational age.  Pain/Pressure: Present     Pelvic: Cervical exam deferred        Extremities: Normal range of motion.     Mental Status: Normal mood and affect. Normal behavior. Normal judgment and thought content.   Assessment and Plan:  Pregnancy: [id number] at 3757w0d  1. History of cesarean section   2. History of preterm delivery   3. Type 2 diabetes mellitus during pregnancy, antepartum - sugars are pretty well controlled on insulin - continue weekly BPP  4. Supervision of high risk pregnancy in third trimester   5. Obesity in pregnancy  6. Maternal age 37+,  multigravida, antepartum   Preterm labor symptoms and general obstetric precautions including but not limited to vaginal bleeding, contractions, leaking of fluid and fetal movement were reviewed in detail with the patient. Please refer to After Visit Summary for other counseling recommendations.  No follow-ups on file.  Future Appointments  Date Time Provider Department Center  06/08/2018 11:00 AM [facility] US 3 [facility]    Allie BossierMyra C Spyridon Hornstein, MD

## 2018-05-25 NOTE — Progress Notes (Signed)

## 2018-05-25 NOTE — Addendum Note (Signed)
Addended by: Jill SideAY, DIANE L on: 05/25/2018 11:40 AM   Modules accepted: Orders

## 2018-05-28 NOTE — Progress Notes (Signed)
I have reviewed this chart and agree with the RN/CMA assessment and management.    Zeven Kocak C Jag Lenz, MD, FACOG Attending Physician, [facility]  

## 2018-06-01 ENCOUNTER — Ambulatory Visit: Payer: Self-pay

## 2018-06-01 ENCOUNTER — Ambulatory Visit (INDEPENDENT_AMBULATORY_CARE_PROVIDER_SITE_OTHER): Payer: Self-pay | Admitting: *Deleted

## 2018-06-01 ENCOUNTER — Encounter: Payer: Self-pay | Admitting: *Deleted

## 2018-06-01 VITALS — BP 113/70 | HR 90

## 2018-06-01 DIAGNOSIS — O24119 Pre-existing diabetes mellitus, type 2, in pregnancy, unspecified trimester: Secondary | ICD-10-CM

## 2018-06-01 NOTE — Progress Notes (Signed)
Interpreter Debarah CrapeClaudia present for encounter. Pt informed that the ultrasound is considered a limited OB ultrasound and is not intended to be a complete ultrasound exam.  Patient also informed that the ultrasound is not being completed with the intent of assessing for fetal or placental anomalies or any pelvic abnormalities.  Explained that the purpose of today's ultrasound is to assess for presentation, BPP and amniotic fluid volume.  Patient acknowledges the purpose of the exam and the limitations of the study.

## 2018-06-08 ENCOUNTER — Ambulatory Visit (INDEPENDENT_AMBULATORY_CARE_PROVIDER_SITE_OTHER): Payer: Self-pay | Admitting: Family Medicine

## 2018-06-08 ENCOUNTER — Encounter (HOSPITAL_COMMUNITY): Payer: Self-pay

## 2018-06-08 ENCOUNTER — Other Ambulatory Visit: Payer: Self-pay

## 2018-06-08 ENCOUNTER — Ambulatory Visit (HOSPITAL_COMMUNITY)
Admission: RE | Admit: 2018-06-08 | Discharge: 2018-06-08 | Disposition: A | Discharge status: Home / Self Care | Payer: Self-pay | Source: Ambulatory Visit | Attending: Family Medicine | Admitting: Family Medicine

## 2018-06-08 ENCOUNTER — Ambulatory Visit (INDEPENDENT_AMBULATORY_CARE_PROVIDER_SITE_OTHER): Payer: Self-pay | Admitting: *Deleted

## 2018-06-08 VITALS — BP 110/65 | HR 90 | Wt 220.0 lb

## 2018-06-08 DIAGNOSIS — Z23 Encounter for immunization: Secondary | ICD-10-CM

## 2018-06-08 DIAGNOSIS — Z8751 Personal history of pre-term labor: Secondary | ICD-10-CM

## 2018-06-08 DIAGNOSIS — Z3A35 35 weeks gestation of pregnancy: Secondary | ICD-10-CM | POA: Insufficient documentation

## 2018-06-08 DIAGNOSIS — O09523 Supervision of elderly multigravida, third trimester: Secondary | ICD-10-CM | POA: Insufficient documentation

## 2018-06-08 DIAGNOSIS — O09213 Supervision of pregnancy with history of pre-term labor, third trimester: Secondary | ICD-10-CM

## 2018-06-08 DIAGNOSIS — O0993 Supervision of high risk pregnancy, unspecified, third trimester: Secondary | ICD-10-CM | POA: Insufficient documentation

## 2018-06-08 DIAGNOSIS — O24119 Pre-existing diabetes mellitus, type 2, in pregnancy, unspecified trimester: Secondary | ICD-10-CM

## 2018-06-08 DIAGNOSIS — Z789 Other specified health status: Secondary | ICD-10-CM

## 2018-06-08 DIAGNOSIS — Z98891 History of uterine scar from previous surgery: Secondary | ICD-10-CM

## 2018-06-08 DIAGNOSIS — O24113 Pre-existing diabetes mellitus, type 2, in pregnancy, third trimester: Secondary | ICD-10-CM

## 2018-06-08 DIAGNOSIS — Z758 Other problems related to medical facilities and other health care: Secondary | ICD-10-CM | POA: Insufficient documentation

## 2018-06-08 DIAGNOSIS — O24414 Gestational diabetes mellitus in pregnancy, insulin controlled: Secondary | ICD-10-CM | POA: Insufficient documentation

## 2018-06-08 DIAGNOSIS — O09529 Supervision of elderly multigravida, unspecified trimester: Secondary | ICD-10-CM

## 2018-06-08 LAB — POCT URINALYSIS DIP (DEVICE)
Bilirubin Urine: NEGATIVE
Glucose, UA: NEGATIVE mg/dL
HGB URINE DIPSTICK: NEGATIVE
KETONES UR: NEGATIVE mg/dL
Leukocytes, UA: NEGATIVE
NITRITE: NEGATIVE
PH: 6.5 (ref 5.0–8.0)
Protein, ur: NEGATIVE mg/dL
SPECIFIC GRAVITY, URINE: 1.02 (ref 1.005–1.030)
Urobilinogen, UA: 0.2 mg/dL (ref 0.0–1.0)

## 2018-06-08 MED ORDER — INSULIN NPH (HUMAN) (ISOPHANE) 100 UNIT/ML ~~LOC~~ SUSP: SUBCUTANEOUS | 3 refills | Status: DC

## 2018-06-08 MED ORDER — INSULIN ASPART 100 UNIT/ML ~~LOC~~ SOLN: 14.0000 [IU] | Freq: Three times a day (TID) | SUBCUTANEOUS | 12 refills | Status: DC

## 2018-06-08 NOTE — Progress Notes (Signed)
      PRENATAL VISIT NOTE  Subjective:  Kara Dominguez is a 37 y.o. [phone] at [redacted]w[redacted]d being seen today for ongoing prenatal care.  She is currently monitored for the following issues for this high-risk pregnancy and has History of preterm delivery; Supervision of high-risk pregnancy; Maternal age 51+, multigravida, antepartum; Shoulder dystocia; Seborrheic dermatitis; History of cesarean section; Type 2 diabetes mellitus during pregnancy, antepartum; Obesity in pregnancy; and Language barrier on their problem list.  Patient reports no complaints.  Contractions: Not present. Vag. Bleeding: None.  Movement: Present. Denies leaking of fluid.   The following portions of the patient's history were reviewed and updated as appropriate: allergies, current medications, past family history, past medical history, past social history, past surgical history and problem list. Problem list updated.  Objective:   Vitals:   06/08/18 0944  BP: 110/65  Pulse: 90  Weight: 220 lb (99.8 kg)    Fetal Status: Fetal Heart Rate (bpm): NST   Movement: Present     General:  Alert, oriented and cooperative. Patient is in no acute distress.  Skin: Skin is warm and dry. No rash noted.   Cardiovascular: Normal heart rate noted  Respiratory: Normal respiratory effort, no problems with respiration noted  Abdomen: Soft, gravid, appropriate for gestational age.  Pain/Pressure: Absent     Pelvic: Cervical exam deferred        Extremities: Normal range of motion.  Edema: None  Mental Status: Normal mood and affect. Normal behavior. Normal judgment and thought content.  NST:  Baseline: 150 bpm, Variability: Good {> 6 bpm), Accelerations: Reactive and Decelerations: Absent Assessment and Plan:  Pregnancy: [id number] at [redacted]w[redacted]d  1. Type 2 diabetes mellitus during pregnancy, antepartum FBS 80-112 > 1/2 are out of range 2 hour pp 109-151 most are out of range Has u/s for growth today, BPP 8/8 on 9/24 Increase insulin  as follows NPH am 34->40 am 40->42 hs - insulin NPH Human (HUMULIN N,NOVOLIN N) 100 UNIT/ML injection; 40 units before breakfast & 42 units at bedtime  Dispense: 10 mL; Refill: 3 Novolog 12 tid->14 q am, 16 with lunch and dinner - insulin aspart (NOVOLOG) 100 UNIT/ML injection; Inject 14-16 Units into the skin 3 (three) times daily before meals. 14 with morning meal, 16 u with afternoon and evening meals  Dispense: 10 mL; Refill: 12  2. Supervision of high risk pregnancy in third trimester - Flu Vaccine QUAD 36+ mos IM  3. History of cesarean section For TOLAC--consent signed s/p VBAC x 2  4. History of preterm delivery Continue prometrium until 37 wks  5. Language barrier Spanish interpreter: Cicero Duck used   6. Maternal age 86+, multigravida, antepartum Declined genetics  Preterm labor symptoms and general obstetric precautions including but not limited to vaginal bleeding, contractions, leaking of fluid and fetal movement were reviewed in detail with the patient. Please refer to After Visit Summary for other counseling recommendations.  Return in 1 week (on 06/15/2018) for NST/BPP and HOB weekly until 10/18.  Future Appointments  Date Time Provider Department Center  06/08/2018 11:00 AM [facility] [place] 3 [facility]    Reva Bores, MD

## 2018-06-08 NOTE — Patient Instructions (Addendum)
Novolog 13 con desayuno, 15 u con almuerzo y 16 unidades con la cena NPH 40 unidades antes del desayuno y 42 unidades antes de Teacher, music   [place] materna Breastfeeding Decidir Museum/gallery exhibitions officer es una de las mejores elecciones que puede hacer por usted y su beb. Un cambio en las hormonas durante el embarazo hace que las mamas produzcan leche materna en las glndulas productoras de [place]. Las hormonas impiden que la leche materna sea liberada antes del nacimiento del beb. Adems, impulsan el flujo de leche luego del nacimiento. Una vez que ha comenzado a Museum/gallery exhibitions officer, Conservation officer, nature beb, as Immunologist succin o Theatre manager, pueden estimular la liberacin de Pandora de las glndulas productoras de [place]. Los beneficios de [employer] investigaciones demuestran que la lactancia materna ofrece muchos beneficios de salud para bebs y [place]. Adems, ofrece una forma gratuita y conveniente de Corporate treasurer al beb. Para el beb  La primera leche (calostro) ayuda a Careers information officer funcionamiento del aparato digestivo del beb.  Las clulas especiales de la leche (anticuerpos) ayudan a Artist las infecciones en el beb.  Los bebs que se alimentan con leche materna tambin tienen menos probabilidades de tener asma, alergias, obesidad o diabetes de tipo 2. Adems, tienen menor riesgo de sufrir el sndrome de muerte sbita del lactante (SMSL).  Los nutrientes de la [place] materna son mejores para Patent examiner las necesidades del beb en comparacin con la [employer] Inc.  La leche materna mejora el desarrollo cerebral del beb. Para usted  La lactancia materna favorece el desarrollo de un vnculo muy especial entre la madre y el beb.  Es conveniente. La leche materna es econmica y siempre est disponible a la Human resources officer.  La lactancia materna ayuda a quemar caloras. Claude Manges a perder el peso ganado durante el [place].  Hace que el tero vuelva al tamao que tena antes del embarazo ms rpido.  Adems, disminuye el sangrado (loquios) despus del parto.  La lactancia materna contribuye a reducir Nurse, adult de tener diabetes de tipo 2, osteoporosis, artritis reumatoide, enfermedades cardiovasculares y cncer de mama, ovario, tero y endometrio en el futuro. Informacin bsica sobre la lactancia Comienzo de la lactancia  Encuentre un lugar cmodo para sentarse o Teacher, music, con un buen respaldo para el cuello y la espalda.  Coloque una almohada o una manta enrollada debajo del beb para acomodarlo a la altura de la mama (si est sentada). Las almohadas para Museum/gallery exhibitions officer se han diseado especialmente a fin de servir de apoyo para los brazos y el beb [employer].  Asegrese de que la barriga del beb (abdomen) est frente a la suya.  [place] suavemente la mama. Con las yemas de los dedos, [employer] Media bordes exteriores de la mama hacia adentro, en direccin al pezn. Esto estimula el flujo de [place]. Si la [employer], es posible que deba Educational psychologist con este movimiento durante la Market researcher.  Sostenga la mama con 4 dedos por debajo y Multimedia programmer por arriba del pezn (forme la letra "C" con la mano). Asegrese de que los dedos se encuentren lejos del pezn y de la boca del beb.  Empuje suavemente los labios del beb con el pezn o con el dedo.  Cuando la boca del beb se abra lo suficiente, acrquelo rpidamente a la mama e introduzca todo el pezn y la arola, tanto como sea posible, dentro de la boca del beb. La arola es la zona de color que rodea al pezn. ? Debe haber ms arola visible por Tomasita Crumble  del labio superior del beb que por debajo del labio inferior. ? Los labios del beb deben estar abiertos y extendidos hacia afuera (evertidos) para asegurar que el beb se prenda de forma adecuada y cmoda. ? La lengua del beb debe estar entre la enca inferior y Educational psychologist.  Asegrese de que la boca del beb est en la posicin correcta alrededor del pezn (prendido). Los labios del  beb deben crear un sello sobre la mama y estar doblados hacia afuera (invertidos).  Es comn que el beb succione durante 2 a 3 minutos para que comience el flujo de [place]. Cmo debe prenderse Es muy importante que le ensee al beb cmo prenderse adecuadamente a la mama. Si el beb no se prende adecuadamente, puede causar [employer] Stores, reducir la produccin de [place] materna y Radio producer que el beb tenga un escaso aumento de [place]. Adems, si el beb no se prende adecuadamente al pezn, puede tragar aire durante la alimentacin. Esto puede causarle molestias al beb. Hacer eructar al beb al Pilar Plate de mama puede ayudarlo a liberar el aire. Sin embargo, ensearle al beb cmo prenderse a la mama adecuadamente es la mejor manera de evitar que se sienta molesto por tragar Oceanographer se alimenta. Signos de que el beb se ha prendido adecuadamente al pezn  [place]onea o succiona de modo silencioso, sin Publishing rights manager. Los labios del beb deben estar extendidos hacia afuera (evertidos).  Se escucha que traga cada 3 o 4 succiones una vez que la W[employer] Resources ha comenzado a Radiographer, therapeutic (despus de que se produzca el reflejo de eyeccin de la [place]).  Hay movimientos musculares por arriba y por delante de sus odos al Printmaker.  Signos de que el beb no se ha prendido Audiological scientist al pezn  Hace ruidos de succin o de chasquido mientras se Tree surgeon.  Siente dolor en los pezones.  Si cree que el beb no se prendi correctamente, deslice el dedo en la comisura de la boca y [employer] Corporation las encas del beb para interrumpir la succin. Intente volver a comenzar a Museum/gallery exhibitions officer. Signos de Market researcher materna exitosa Signos del beb  El beb disminuir gradualmente el nmero de succiones o dejar de succionar por completo.  El beb se quedar dormido.  El cuerpo del beb se relajar.  El beb retendr Neomia Dear pequea cantidad de [facility] boca.  El beb se desprender solo del [place].  Signos que  presenta usted  Las mamas han aumentado la firmeza, el peso y el tamao 1 a 3 horas despus de Museum/gallery exhibitions officer.  Estn ms blandas inmediatamente despus de amamantar.  Se producen un aumento del volumen de [place] y un cambio en su consistencia y color hacia el quinto da de Market researcher.  Los pezones no duelen, no estn agrietados ni sangran.  Signos de que su beb recibe la cantidad de leche suficiente  Mojar por lo menos 1 o 2paales durante las primeras 24horas despus del nacimiento.  Mojar por lo menos 5 o 6paales cada 24horas durante la primera semana despus del nacimiento. La orina debe ser clara o de color amarillo plido a los 5das de vida.  Mojar entre 6 y 8paales cada 24horas a medida que el beb sigue creciendo y desarrollndose.  Defeca por lo menos 3 veces en 24 horas a los 5 [address] de [address] Las heces deben ser blandas y Armed forces operational officer.  Defeca por lo menos 3 veces en 24 horas a los [address] de [address] Las heces deben ser grumosas y  amarillentas.  No registra una prdida de peso mayor al 10% del peso al nacer durante los primeros 3 [address]  [address] de [place].  Aumenta de peso un promedio de 4 a 7onzas (113 a 198g) por semana despus de los 4 [address] de vida.  Aumenta de [place], [place], de [place] uniforme a Glass blower/designer de los 5 [address] de vida, sin Passenger transport manager prdida de peso despus de las 2semanas de vida. Despus de alimentarse, es posible que el beb regurgite una pequea cantidad de [place]. Esto es normal. Frecuencia y duracin de la lactancia El amamantamiento frecuente la ayudar a producir ms [place] y puede prevenir dolores en los pezones y las mamas extremadamente llenas (congestin [place]). Alimente al beb cuando muestre signos de hambre o si siente la necesidad de reducir la congestin de las [place]. Esto se denomina "lactancia a demanda". Las seales de que el beb tiene hambre incluyen las siguientes:  Aumento del [place] de [place], [place] o inquietud.  Mueve la cabeza de un lado a  otro.  Abre la boca cuando se le toca la mejilla o la comisura de la boca (reflejo de bsqueda).  Aumenta las vocalizaciones, tales como sonidos de succin, se relame los labios, emite arrullos, suspiros o chirridos.  Mueve la [employer] boca y se chupa los dedos o las manos.  Est molesto o llora.  Evite el uso del chupete en las primeras 4 a 6 semanas despus del nacimiento del beb. Despus de este perodo, podr usar un chupete. Las investigaciones demostraron que el uso del chupete durante Financial risk analyst ao de vida del beb disminuye el riesgo de tener el sndrome de muerte sbita del lactante (SMSL). Permita que el nio se alimente en cada mama todo lo que desee. Cuando el beb se desprende o se queda dormido mientras se est alimentando de la primera mama, ofrzcale la segunda. Debido a que, con frecuencia, los recin nacidos estn somnolientos las primeras semanas de vida, es posible que deba despertar al beb para alimentarlo. Los horarios de Acupuncturist de un beb a otro. Sin embargo, las siguientes reglas pueden servir como gua para ayudarla a Lawyer que el beb se alimenta adecuadamente:  Se puede amamantar a los recin nacidos (bebs de 4 semanas o menos de vida) cada 1 a 3 horas.  No deben transcurrir ms de 3 horas durante el da o 5 horas durante la noche sin que se amamante a los recin nacidos.  Debe amamantar al beb un mnimo de 8 veces en un perodo de 24 horas.  Extraccin de Bank of America extraccin y Contractor de la leche materna le permiten asegurarse de que el beb se alimente exclusivamente de su leche materna, aun en momentos en los que no puede Museum/gallery exhibitions officer. Esto tiene especial importancia si debe regresar al Aleen Campi en el perodo en que an est amamantando o si no puede estar presente en los momentos en que el beb debe alimentarse. Su asesor en lactancia puede ayudarla a Clinical research associate un mtodo de extraccin que funcione mejor para usted y Dispensing optician cunto tiempo es seguro almacenar [place]. Cmo cuidar las mamas durante la lactancia Los pezones pueden secarse, Lobbyist y doler durante la Market researcher. Las siguientes recomendaciones pueden ayudarla a Pharmacologist las [employer] y sanas:  Careers information officer usar jabn en los pezones.  Use un sostn de soporte diseado especialmente para la lactancia materna. Evite usar sostenes con aro o sostenes muy ajustados (sostenes deportivos).  Seque al aire sus pezones durante 3 a despus  de amamantar al beb.  Utilice solo apsitos de Haematologist sostn para Environmental health practitioner las prdidas de [place]. La prdida de un poco de Public Service Enterprise Group tomas es normal.  Utilice lanolina sobre los pezones luego de Museum/gallery exhibitions officer. La lanolina ayuda a mantener la humedad normal de la piel. La lanolina pura no es perjudicial (no es txica) para el beb. Adems, puede extraer Be[employer] Homes algunas gotas de [place] materna y Engineer, maintenance (IT) suavemente esa [employer] pezones para que la [place] se seque al aire.  Durante las primeras semanas despus del nacimiento, algunas mujeres experimentan [place]. La congestin [employer] puede hacer que sienta las mamas pesadas, calientes y sensibles al tacto. El pico de la congestin mamaria ocurre en el plazo de los 3 a 5 das despus del [place]. Las siguientes recomendaciones pueden ayudarla a Paramedic la congestin mamaria:  Vace por completo las mamas al [employer] o Environmental health practitioner. Puede aplicar calor hmedo en las mamas (en la ducha o con toallas hmedas para manos) antes de Museum/gallery exhibitions officer o extraer [employer]. Esto aumenta la circulacin y [place] a que la [place]. Si el beb no vaca por completo las [address] cuando lo [address], extraiga la Pandora restante despus de que haya finalizado.  Aplique compresas de hielo Yahoo! Inc inmediatamente despus de Museum/gallery exhibitions officer o extraer [place], a menos que le resulte demasiado incmodo. Haga lo siguiente: ? Ponga el hielo en una bolsa  plstica. ? Coloque una FirstEnergy Corp piel y la bolsa de hielo. ? Coloque el hielo durante , 2 o 3veces por da.  Asegrese de que el beb est prendido y se encuentre en la posicin correcta mientras lo alimenta.  Si la congestin mamaria persiste luego de 48 horas o despus de seguir estas recomendaciones, comunquese con su mdico o un Holiday representative. Recomendaciones de salud general durante la lactancia  Consuma 3 comidas y 3 colaciones saludables todos los [place]. Las [employer] Holdings bien alimentadas que amamantan necesitan entre 450 y 500 caloras adicionales por Futures trader. Puede cumplir con este requisito al aumentar la cantidad de una dieta equilibrada que realice.  Beba suficiente agua para mantener la orina clara o de color amarillo plido.  Descanse con frecuencia, reljese y siga tomando sus vitaminas prenatales para prevenir la fatiga, el estrs y los niveles bajos de vitaminas y [employer] en el cuerpo (deficiencias de nutrientes).  No consuma ningn producto que contenga nicotina o tabaco, como cigarrillos y Administrator, Civil Service. El beb puede verse afectado por las sustancias qumicas de los cigarrillos que pasan a la [place] materna y por la exposicin al humo ambiental del tabaco. Si necesita ayuda para dejar de fumar, consulte al mdico.  Evite el consumo de alcohol.  No consuma drogas ilegales o marihuana.  Antes de Dietitian, hable con el mdico. Estos incluyen medicamentos recetados y de [place], como tambin vitaminas y suplementos a base de hierbas. Algunos medicamentos, que pueden ser perjudiciales para el beb, pueden pasar a travs de la Colgate Palmolive.  Puede quedar embarazada durante la lactancia. Si se desea un mtodo anticonceptivo, consulte al mdico sobre cules son las opciones seguras durante la Market researcher. Dnde encontrar ms informacin: Liga internacional [place]: [email or web address] Comunquese con un mdico si:  Siente que quiere  dejar de Museum/gallery exhibitions officer o se siente frustrada con la lactancia.  Sus pezones estn agrietados o Water quality scientist.  Sus mamas estn irritadas, sensibles o calientes.  Tiene los siguientes sntomas: ? Dolor en las mamas o en los pezones. ? Un rea hinchada  en cualquiera de las mamas. ? Grant Ruts o escalofros. ? Nuseas o vmitos. ? Drenaje de otro lquido distinto de la W[employer] Resources materna desde los pezones.  Sus mamas no se llenan antes de Museum/gallery exhibitions officer al beb para el quinto da despus del [place].  Se siente triste y deprimida.  El beb: ? Est demasiado somnoliento como para comer bien. ? Tiene problemas para dormir. ? Tiene ms de 1 semana de vida y H[employer] Inc de 6 paales en un periodo de 24 horas. ? No ha aumentado de [place] a los [address] de [address]  El beb defeca menos de 3 veces en 24 horas.  La piel del beb o las partes blancas de los ojos se vuelven amarillentas. Solicite ayuda de inmediato si:  El beb est muy cansado Retail buyer) y no se quiere despertar para comer.  Le sube la fiebre sin causa. Resumen  La lactancia materna ofrece muchos beneficios de salud para bebs y [place].  Intente amamantar a su beb cuando muestre signos tempranos de hambre.  Haga cosquillas o empuje suavemente los labios del beb con el dedo o el pezn para lograr que el beb abra la boca. Acerque el beb a la mama. Asegrese de que la mayor parte de la arola se encuentre dentro de la boca del beb. Ofrzcale una mama y haga eructar al beb antes de pasar a la otra.  Hable con su mdico o asesor en lactancia si tiene dudas o problemas con la lactancia. Esta informacin no tiene Theme park manager el consejo del mdico. Asegrese de hacerle al mdico cualquier pregunta que tenga. Document Released: 08/29/2005 Document Revised: 12/19/2016 Document Reviewed: 12/19/2016 Elsevier Interactive Patient Education  2018 [employer].

## 2018-06-08 NOTE — Progress Notes (Signed)
US for growth and BPP today @ 1100 

## 2018-06-15 ENCOUNTER — Ambulatory Visit (INDEPENDENT_AMBULATORY_CARE_PROVIDER_SITE_OTHER): Payer: Self-pay | Admitting: *Deleted

## 2018-06-15 ENCOUNTER — Ambulatory Visit (INDEPENDENT_AMBULATORY_CARE_PROVIDER_SITE_OTHER): Payer: Self-pay | Admitting: Obstetrics & Gynecology

## 2018-06-15 ENCOUNTER — Ambulatory Visit: Payer: Self-pay

## 2018-06-15 DIAGNOSIS — O99213 Obesity complicating pregnancy, third trimester: Secondary | ICD-10-CM

## 2018-06-15 DIAGNOSIS — O24113 Pre-existing diabetes mellitus, type 2, in pregnancy, third trimester: Secondary | ICD-10-CM

## 2018-06-15 DIAGNOSIS — O24119 Pre-existing diabetes mellitus, type 2, in pregnancy, unspecified trimester: Secondary | ICD-10-CM

## 2018-06-15 DIAGNOSIS — O0993 Supervision of high risk pregnancy, unspecified, third trimester: Secondary | ICD-10-CM

## 2018-06-15 DIAGNOSIS — Z113 Encounter for screening for infections with a predominantly sexual mode of transmission: Secondary | ICD-10-CM

## 2018-06-15 DIAGNOSIS — O09523 Supervision of elderly multigravida, third trimester: Secondary | ICD-10-CM

## 2018-06-15 DIAGNOSIS — O09529 Supervision of elderly multigravida, unspecified trimester: Secondary | ICD-10-CM

## 2018-06-15 DIAGNOSIS — O99210 Obesity complicating pregnancy, unspecified trimester: Secondary | ICD-10-CM

## 2018-06-15 DIAGNOSIS — Z98891 History of uterine scar from previous surgery: Secondary | ICD-10-CM

## 2018-06-15 DIAGNOSIS — Z8751 Personal history of pre-term labor: Secondary | ICD-10-CM

## 2018-06-15 NOTE — Progress Notes (Signed)
Interpreter Erika present for encounter.   

## 2018-06-15 NOTE — Progress Notes (Signed)
   PRENATAL VISIT NOTE  Subjective:  Kara Dominguez is a 37 y.o. [phone] at [redacted]w[redacted]d being seen today for ongoing prenatal care.  She is currently monitored for the following issues for this high-risk pregnancy and has History of preterm delivery; Supervision of high-risk pregnancy; Maternal age 59+, multigravida, antepartum; Shoulder dystocia; Seborrheic dermatitis; History of cesarean section; Type 2 diabetes mellitus during pregnancy, antepartum; Obesity in pregnancy; and Language barrier on their problem list.  Patient reports no complaints.   .  .   . Denies leaking of fluid.   The following portions of the patient's history were reviewed and updated as appropriate: allergies, current medications, past family history, past medical history, past social history, past surgical history and problem list. Problem list updated.  Objective:  There were no vitals filed for this visit.  Fetal Status:           General:  Alert, oriented and cooperative. Patient is in no acute distress.  Skin: Skin is warm and dry. No rash noted.   Cardiovascular: Normal heart rate noted  Respiratory: Normal respiratory effort, no problems with respiration noted  Abdomen: Soft, gravid, appropriate for gestational age.        Pelvic: Cervical exam performed        Extremities: Normal range of motion.     Mental Status: Normal mood and affect. Normal behavior. Normal judgment and thought content.   Assessment and Plan:  Pregnancy: [id number] at [redacted]w[redacted]d  1. Supervision of high risk pregnancy in third trimester  - GC/Chlamydia probe amp ([place])not at [facility] - Strep Gp B NAA  2. Type 2 diabetes mellitus during pregnancy, antepartum - sugars in the morning ok, sugars after lunch and supper still elevated I have rec'd that she increase her regular insulin from 16 to 18 after each meal  3. History of preterm delivery   4. History of cesarean section  5. Obesity in pregnancy   6. Maternal age 81+,  multigravida, antepartum   Term labor symptoms and general obstetric precautions including but not limited to vaginal bleeding, contractions, leaking of fluid and fetal movement were reviewed in detail with the patient. Please refer to After Visit Summary for other counseling recommendations.  Return for weekly BPP.  Future Appointments  Date Time Provider Department Center  06/22/2018  8:15 AM [facility] NST [facility]  06/22/2018  9:15 AM Allie Bossier, MD [facility]    Allie Bossier, MD

## 2018-06-17 LAB — STREP GP B NAA: STREP GROUP B AG: NEGATIVE

## 2018-06-18 LAB — GC/CHLAMYDIA PROBE AMP (~~LOC~~) NOT AT ARMC
Chlamydia: NEGATIVE
NEISSERIA GONORRHEA: NEGATIVE

## 2018-06-22 ENCOUNTER — Ambulatory Visit: Payer: Self-pay

## 2018-06-22 ENCOUNTER — Ambulatory Visit (INDEPENDENT_AMBULATORY_CARE_PROVIDER_SITE_OTHER): Payer: Self-pay | Admitting: *Deleted

## 2018-06-22 ENCOUNTER — Ambulatory Visit (INDEPENDENT_AMBULATORY_CARE_PROVIDER_SITE_OTHER): Payer: Self-pay | Admitting: Obstetrics & Gynecology

## 2018-06-22 VITALS — BP 124/64 | HR 85 | Wt 223.5 lb

## 2018-06-22 DIAGNOSIS — O09523 Supervision of elderly multigravida, third trimester: Secondary | ICD-10-CM

## 2018-06-22 DIAGNOSIS — Z8751 Personal history of pre-term labor: Secondary | ICD-10-CM

## 2018-06-22 DIAGNOSIS — O24119 Pre-existing diabetes mellitus, type 2, in pregnancy, unspecified trimester: Secondary | ICD-10-CM

## 2018-06-22 DIAGNOSIS — O24113 Pre-existing diabetes mellitus, type 2, in pregnancy, third trimester: Secondary | ICD-10-CM

## 2018-06-22 DIAGNOSIS — Z98891 History of uterine scar from previous surgery: Secondary | ICD-10-CM

## 2018-06-22 DIAGNOSIS — O0993 Supervision of high risk pregnancy, unspecified, third trimester: Secondary | ICD-10-CM

## 2018-06-22 DIAGNOSIS — O09529 Supervision of elderly multigravida, unspecified trimester: Secondary | ICD-10-CM

## 2018-06-22 DIAGNOSIS — O09213 Supervision of pregnancy with history of pre-term labor, third trimester: Secondary | ICD-10-CM

## 2018-06-22 NOTE — Progress Notes (Signed)
   PRENATAL VISIT NOTE  Subjective:  Kara Dominguez is a 37 y.o. [phone] at [redacted]w[redacted]d being seen today for ongoing prenatal care.  She is currently monitored for the following issues for this high-risk pregnancy and has History of preterm delivery; Supervision of high-risk pregnancy; Maternal age 10+, multigravida, antepartum; Shoulder dystocia; Seborrheic dermatitis; History of cesarean section; Type 2 diabetes mellitus during pregnancy, antepartum; Obesity in pregnancy; and Language barrier on their problem list.  Patient reports no complaints.  Contractions: Irregular. Vag. Bleeding: None.  Movement: Present. Denies leaking of fluid.   The following portions of the patient's history were reviewed and updated as appropriate: allergies, current medications, past family history, past medical history, past social history, past surgical history and problem list. Problem list updated.  Objective:   Vitals:   06/22/18 0831  BP: 124/64  Pulse: 85  Weight: 223 lb 8 oz (101.4 kg)    Fetal Status: Fetal Heart Rate (bpm): NST   Movement: Present     General:  Alert, oriented and cooperative. Patient is in no acute distress.  Skin: Skin is warm and dry. No rash noted.   Cardiovascular: Normal heart rate noted  Respiratory: Normal respiratory effort, no problems with respiration noted  Abdomen: Soft, gravid, appropriate for gestational age.  Pain/Pressure: Present     Pelvic: Cervical exam deferred        Extremities: Normal range of motion.  Edema: None  Mental Status: Normal mood and affect. Normal behavior. Normal judgment and thought content.   Assessment and Plan:  Pregnancy: [id number] at [redacted]w[redacted]d  1. Supervision of high risk pregnancy in third trimester   2. Type 2 diabetes mellitus during pregnancy, antepartum - good control of sugars after last increase in insulin with meals  3. History of preterm delivery - on progesterone per vagina  4. History of cesarean section - plans  repeat VBAC x 3  5. Maternal age 97+, multigravida, antepartum   Preterm labor symptoms and general obstetric precautions including but not limited to vaginal bleeding, contractions, leaking of fluid and fetal movement were reviewed in detail with the patient. Please refer to After Visit Summary for other counseling recommendations.  Return in about 1 week (around 06/29/2018) for NST/BPP and HOB.  Future Appointments  Date Time Provider Department Center  06/22/2018  9:15 AM Allie Bossier, MD [facility]    Allie Bossier, MD

## 2018-06-22 NOTE — Progress Notes (Signed)

## 2018-06-25 NOTE — Progress Notes (Signed)
I have reviewed this chart and agree with the RN/CMA assessment and management.    Gethsemane Fischler C Stark Aguinaga, MD, FACOG Attending Physician, [facility]   

## 2018-06-28 ENCOUNTER — Ambulatory Visit (HOSPITAL_COMMUNITY)
Admission: RE | Admit: 2018-06-28 | Discharge: 2018-06-28 | Disposition: A | Discharge status: Home / Self Care | Payer: Self-pay | Source: Ambulatory Visit | Attending: Obstetrics & Gynecology | Admitting: Obstetrics & Gynecology

## 2018-06-28 ENCOUNTER — Other Ambulatory Visit: Payer: Self-pay

## 2018-06-28 ENCOUNTER — Encounter (HOSPITAL_COMMUNITY): Payer: Self-pay

## 2018-06-28 DIAGNOSIS — O09529 Supervision of elderly multigravida, unspecified trimester: Secondary | ICD-10-CM

## 2018-06-28 DIAGNOSIS — O24414 Gestational diabetes mellitus in pregnancy, insulin controlled: Secondary | ICD-10-CM | POA: Insufficient documentation

## 2018-06-28 DIAGNOSIS — O09523 Supervision of elderly multigravida, third trimester: Secondary | ICD-10-CM

## 2018-06-28 DIAGNOSIS — O24113 Pre-existing diabetes mellitus, type 2, in pregnancy, third trimester: Secondary | ICD-10-CM

## 2018-06-28 DIAGNOSIS — Z3A37 37 weeks gestation of pregnancy: Secondary | ICD-10-CM | POA: Insufficient documentation

## 2018-06-28 DIAGNOSIS — O24119 Pre-existing diabetes mellitus, type 2, in pregnancy, unspecified trimester: Secondary | ICD-10-CM

## 2018-06-28 DIAGNOSIS — Z362 Encounter for other antenatal screening follow-up: Secondary | ICD-10-CM

## 2018-06-29 ENCOUNTER — Inpatient Hospital Stay (HOSPITAL_COMMUNITY)
Admission: AD | Admit: 2018-06-29 | Discharge: 2018-07-01 | DRG: 805 | Disposition: A | Discharge status: Home / Self Care | Payer: Medicaid Other | Attending: Obstetrics and Gynecology | Admitting: Obstetrics and Gynecology

## 2018-06-29 ENCOUNTER — Other Ambulatory Visit: Payer: Self-pay

## 2018-06-29 ENCOUNTER — Encounter (HOSPITAL_COMMUNITY): Payer: Self-pay | Admitting: *Deleted

## 2018-06-29 DIAGNOSIS — Z794 Long term (current) use of insulin: Secondary | ICD-10-CM | POA: Diagnosis not present

## 2018-06-29 DIAGNOSIS — O34219 Maternal care for unspecified type scar from previous cesarean delivery: Secondary | ICD-10-CM | POA: Diagnosis present

## 2018-06-29 DIAGNOSIS — E119 Type 2 diabetes mellitus without complications: Secondary | ICD-10-CM | POA: Diagnosis present

## 2018-06-29 DIAGNOSIS — O480 Post-term pregnancy: Principal | ICD-10-CM | POA: Diagnosis present

## 2018-06-29 DIAGNOSIS — Z3A38 38 weeks gestation of pregnancy: Secondary | ICD-10-CM

## 2018-06-29 DIAGNOSIS — O2412 Pre-existing diabetes mellitus, type 2, in childbirth: Secondary | ICD-10-CM | POA: Diagnosis present

## 2018-06-29 DIAGNOSIS — O99210 Obesity complicating pregnancy, unspecified trimester: Secondary | ICD-10-CM

## 2018-06-29 LAB — CBC
HEMATOCRIT: 36.8 % (ref 36.0–46.0)
Hemoglobin: 12.1 g/dL (ref 12.0–15.0)
MCH: 27.8 pg (ref 26.0–34.0)
MCHC: 32.9 g/dL (ref 30.0–36.0)
MCV: 84.4 fL (ref 80.0–100.0)
PLATELETS: 250 10*3/uL (ref 150–400)
RBC: 4.36 MIL/uL (ref 3.87–5.11)
RDW: 15 % (ref 11.5–15.5)
WBC: 7.7 10*3/uL (ref 4.0–10.5)
nRBC: 0 % (ref 0.0–0.2)

## 2018-06-29 LAB — TYPE AND SCREEN
ABO/RH(D): O POS
ANTIBODY SCREEN: NEGATIVE

## 2018-06-29 LAB — GLUCOSE, CAPILLARY: Glucose-Capillary: 84 mg/dL (ref 70–99)

## 2018-06-29 MED ORDER — BENZOCAINE-MENTHOL 20-0.5 % EX AERO
1.0000 "application " | INHALATION_SPRAY | CUTANEOUS | Status: DC | PRN
  Administered 2018-06-29: 1 via TOPICAL
  Filled 2018-06-29: qty 56

## 2018-06-29 MED ORDER — ACETAMINOPHEN 325 MG PO TABS: 650.0000 mg | ORAL_TABLET | ORAL | Status: DC | PRN

## 2018-06-29 MED ORDER — LACTATED RINGERS IV SOLN
INTRAVENOUS | Status: DC
  Administered 2018-06-29 (×2): via INTRAVENOUS

## 2018-06-29 MED ORDER — ONDANSETRON HCL 4 MG PO TABS: 4.0000 mg | ORAL_TABLET | ORAL | Status: DC | PRN

## 2018-06-29 MED ORDER — DIPHENHYDRAMINE HCL 25 MG PO CAPS: 25.0000 mg | ORAL_CAPSULE | Freq: Four times a day (QID) | ORAL | Status: DC | PRN

## 2018-06-29 MED ORDER — MAGNESIUM HYDROXIDE 400 MG/5ML PO SUSP: 30.0000 mL | ORAL | Status: DC | PRN

## 2018-06-29 MED ORDER — IBUPROFEN 600 MG PO TABS
600.0000 mg | ORAL_TABLET | Freq: Four times a day (QID) | ORAL | Status: DC
  Administered 2018-06-29 – 2018-07-01 (×9): 600 mg via ORAL
  Filled 2018-06-29 (×9): qty 1

## 2018-06-29 MED ORDER — DIBUCAINE 1 % RE OINT: 1.0000 "application " | TOPICAL_OINTMENT | RECTAL | Status: DC | PRN

## 2018-06-29 MED ORDER — OXYTOCIN BOLUS FROM INFUSION
500.0000 mL | Freq: Once | INTRAVENOUS | Status: AC
  Administered 2018-06-29: 500 mL via INTRAVENOUS

## 2018-06-29 MED ORDER — MEASLES, MUMPS & RUBELLA VAC ~~LOC~~ INJ: 0.5000 mL | INJECTION | Freq: Once | SUBCUTANEOUS | Status: DC

## 2018-06-29 MED ORDER — MISOPROSTOL 200 MCG PO TABS
ORAL_TABLET | ORAL | Status: AC
  Filled 2018-06-29: qty 5

## 2018-06-29 MED ORDER — TETANUS-DIPHTH-ACELL PERTUSSIS 5-2.5-18.5 LF-MCG/0.5 IM SUSP: 0.5000 mL | Freq: Once | INTRAMUSCULAR | Status: DC

## 2018-06-29 MED ORDER — OXYTOCIN 40 UNITS IN LACTATED RINGERS INFUSION - SIMPLE MED
2.5000 [IU]/h | INTRAVENOUS | Status: DC
  Filled 2018-06-29: qty 1000

## 2018-06-29 MED ORDER — SOD CITRATE-CITRIC ACID 500-334 MG/5ML PO SOLN: 30.0000 mL | ORAL | Status: DC | PRN

## 2018-06-29 MED ORDER — FERROUS SULFATE 325 (65 FE) MG PO TABS
325.0000 mg | ORAL_TABLET | Freq: Two times a day (BID) | ORAL | Status: DC
  Administered 2018-06-30 – 2018-07-01 (×4): 325 mg via ORAL
  Filled 2018-06-29 (×4): qty 1

## 2018-06-29 MED ORDER — FENTANYL CITRATE (PF) 100 MCG/2ML IJ SOLN
100.0000 ug | INTRAMUSCULAR | Status: DC | PRN
  Administered 2018-06-29: 100 ug via INTRAVENOUS
  Filled 2018-06-29: qty 2

## 2018-06-29 MED ORDER — OXYCODONE-ACETAMINOPHEN 5-325 MG PO TABS: 2.0000 | ORAL_TABLET | ORAL | Status: DC | PRN

## 2018-06-29 MED ORDER — ONDANSETRON HCL 4 MG/2ML IJ SOLN: 4.0000 mg | Freq: Four times a day (QID) | INTRAMUSCULAR | Status: DC | PRN

## 2018-06-29 MED ORDER — OXYCODONE-ACETAMINOPHEN 5-325 MG PO TABS: 1.0000 | ORAL_TABLET | ORAL | Status: DC | PRN

## 2018-06-29 MED ORDER — FLEET ENEMA 7-19 GM/118ML RE ENEM: 1.0000 | ENEMA | RECTAL | Status: DC | PRN

## 2018-06-29 MED ORDER — COCONUT OIL OIL: 1.0000 "application " | TOPICAL_OIL | Status: DC | PRN

## 2018-06-29 MED ORDER — ONDANSETRON HCL 4 MG/2ML IJ SOLN: 4.0000 mg | INTRAMUSCULAR | Status: DC | PRN

## 2018-06-29 MED ORDER — LACTATED RINGERS IV SOLN: 500.0000 mL | INTRAVENOUS | Status: DC | PRN

## 2018-06-29 MED ORDER — ACETAMINOPHEN 325 MG PO TABS
650.0000 mg | ORAL_TABLET | ORAL | Status: DC | PRN
  Administered 2018-06-29: 650 mg via ORAL
  Filled 2018-06-29: qty 2

## 2018-06-29 MED ORDER — LIDOCAINE HCL (PF) 1 % IJ SOLN
30.0000 mL | INTRAMUSCULAR | Status: AC | PRN
  Administered 2018-06-29: 30 mL via SUBCUTANEOUS
  Filled 2018-06-29: qty 30

## 2018-06-29 MED ORDER — PRENATAL MULTIVITAMIN CH
1.0000 | ORAL_TABLET | Freq: Every day | ORAL | Status: DC
  Administered 2018-06-30 – 2018-07-01 (×2): 1 via ORAL
  Filled 2018-06-29 (×2): qty 1

## 2018-06-29 MED ORDER — SIMETHICONE 80 MG PO CHEW: 80.0000 mg | CHEWABLE_TABLET | ORAL | Status: DC | PRN

## 2018-06-29 MED ORDER — WITCH HAZEL-GLYCERIN EX PADS: 1.0000 "application " | MEDICATED_PAD | CUTANEOUS | Status: DC | PRN

## 2018-06-29 NOTE — Progress Notes (Signed)
Assumed care of patient at this time.  Received report from Genice Rouge, RN

## 2018-06-29 NOTE — Progress Notes (Signed)
Attempt to Breastfeed.

## 2018-06-29 NOTE — MAU Note (Signed)
Started leaking about 45 min ago, clear fluid, little blood. Contractions started about an hour ago.every 2-5 min.

## 2018-06-29 NOTE — Progress Notes (Signed)
Kara Dominguez is a 37 y.o. [id number] at 105w0d by LMP admitted for rupture of membranes with onset of contractions at approximately 10:45am today  Subjective: Coping well without analgesia. FOB Blossom Hoops at bedside.  Objective: BP 139/84 (BP Location: Left Arm)   Pulse 95   Temp 98.2 F (36.8 C) (Oral)   Resp 20   Wt 101.9 kg   LMP 10/06/2017   SpO2 98%   BMI 36.28 kg/m  No intake/output data recorded. No intake/output data recorded.  FHT:  FHR: 150 bpm, variability: moderate,  accelerations:  Present,  decelerations:  Absent UC:   irregular, every 2-5 minutes. Reporting perineal pressure with contractions SVE:   Dilation: 7.5 Effacement (%): 100 Station: -1 Exam by:: Sam, CNM  Labs: Lab Results  Component Value Date   WBC 7.7 06/29/2018   HGB 12.1 06/29/2018   HCT 36.8 06/29/2018   MCV 84.4 06/29/2018   PLT 250 06/29/2018   Assessment / Plan: Spontaneous labor, progressing normally. Making cervical change without augmentation Fetal Wellbeing:  Category I Pain Control:  Labor support without medications, Order placed for IV Fentanyl per RN request I/D:  n/a Anticipated MOD:  NSVD  Discussed history of shoulder dystocia and current EFW >90% with patient. Using interpreter explained interventions we will utilize if shoulder dystocia occurs and how we will minimize harm to her and baby. Patient verbalized Bethann Humble, [place] 06/29/2018, 1:53 PM

## 2018-06-29 NOTE — H&P (Signed)
OBSTETRIC ADMISSION HISTORY AND PHYSICAL  Kara Dominguez is a 37 y.o. female [phone] with IUP at 49w0dby L=9 week [place]resent[place] for ROM. She reports +FMs, no VB, no blurry vision, headaches or peripheral edema, and RUQ pain.  She plans on breast feeding. She request depo  for birth control. She received her prenatal care at [facility]  Dating: By 9 week [place]--->  Estimated Date of Delivery: 07/13/18  Sono:    _0 , CWD, normal anatomy, cephalic presentation, 30300P >90% EFW   Prenatal History/Complications:  Past Medical History: Past Medical History:  Diagnosis Date  . Gestational diabetes   . Preterm labor     Past Surgical History: Past Surgical History:  Procedure Laterality Date  . CESAREAN SECTION      Obstetrical History: OB History    Gravida  5   Para  4   Term  3   Preterm  1   AB      Living  4     SAB      TAB      Ectopic      Multiple  0   Live Births  4           Social History: Social History   Socioeconomic History  . Marital status: Single    Spouse name: Not on file  . Number of children: Not on file  . Years of education: Not on file  . Highest education level: Not on file  Occupational History  . Not on file  Social Needs  . Financial resource strain: Not on file  . Food insecurity:    Worry: Not on file    Inability: Not on file  . Transportation needs:    Medical: Not on file    Non-medical: Not on file  Tobacco Use  . Smoking status: Never Smoker  . Smokeless tobacco: Never Used  Substance and Sexual Activity  . Alcohol use: No  . Drug use: No  . Sexual activity: Not Currently    Birth control/protection: None  Lifestyle  . Physical activity:    Days per week: Not on file    Minutes per session: Not on file  . Stress: Not on file  Relationships  . Social connections:    Talks on phone: Not on file    Gets together: Not on file    Attends religious service: Not on file    Active member of club or  organization: Not on file    Attends meetings of clubs or organizations: Not on file    Relationship status: Not on file  Other Topics Concern  . Not on file  Social History Narrative  . Not on file    Family History: History reviewed. No pertinent family history.  Allergies: No Known Allergies  Medications Prior to Admission  Medication Sig Dispense Refill Last Dose  . ACCU-CHEK FASTCLIX LANCETS MISC 1 Units by Percutaneous route 4 (four) times daily. 100 each 12 Taking  . aspirin EC 81 MG tablet Take 1 tablet (81 mg total) by mouth daily. Take after 12 weeks for prevention of preeclampsia later in pregnancy 300 tablet 2 Taking  . Blood Glucose Monitoring Suppl (ACCU-CHEK NANO SMARTVIEW) w/Device KIT 1 kit by Subdermal route as directed. Check blood sugars for fasting, and two hours after breakfast, lunch and dinner (4 checks daily) 1 kit 0 Taking  . glucose blood (ACCU-CHEK SMARTVIEW) test strip Use as instructed to check blood sugars 100 each  12 Taking  . insulin aspart (NOVOLOG) 100 UNIT/ML injection Inject 14-16 Units into the skin 3 (three) times daily before meals. 14 with morning meal, 16 u with afternoon and evening meals 10 mL 12 Taking  . insulin NPH Human (HUMULIN N,NOVOLIN N) 100 UNIT/ML injection 40 units before breakfast & 42 units at bedtime 10 mL 3 Taking  . INSULIN SYRINGE 1CC/29G 29G X 1/2" 1 ML MISC 1 Device by Does not apply route 2 (two) times daily. 100 each 5 Taking  . prenatal vitamin w/FE, FA (PRENATAL 1 + 1) 27-1 MG TABS tablet Take 1 tablet by mouth daily. 30 each 0 Taking  . progesterone (PROMETRIUM) 200 MG capsule Place one capsule vaginally at bedtime 90 capsule 1 Taking     Review of Systems   All systems reviewed and negative except as stated in HPI  Blood pressure 135/90, pulse 94, temperature 98.4 F (36.9 C), resp. rate 18, weight 101.9 kg, last menstrual period 10/06/2017, SpO2 98 %, currently breastfeeding. General appearance: alert,  cooperative and appears stated age Lungs: clear to auscultation bilaterally Heart: regular rate and rhythm Abdomen: soft, non-tender; bowel sounds normal Pelvic: deferred Extremities: Homans sign is negative, no sign of DVT DTR's +2 Presentation: cephalic Fetal monitoringBaseline: 150 bpm, Variability: Good {> 6 bpm), Accelerations: Reactive and Decelerations: Absent Uterine activityFrequency: Every 5 minutes Dilation: 6.5 Effacement (%): 90 Station: -2 Exam by:: Fredda Hammed RN    Prenatal labs: ABO, Rh: O/Positive/-- (05/16 0850) Antibody: Negative (05/16 0850) Rubella: 9.17 (05/16 0850) RPR: Non Reactive (08/09 1146)  HBsAg: Negative (05/16 0850)  HIV: Non Reactive (08/09 1146)  GBS: Negative (10/04 1235)  1 hr Glucola n/a Genetic screening  Not done Anatomy US normal  Prenatal Transfer Tool  Maternal Diabetes: Yes:  Diabetes Type:  Pre-pregnancy Genetic Screening: Declined Maternal Ultrasounds/Referrals: Normal Fetal Ultrasounds or other Referrals:  None Maternal Substance Abuse:  No Significant Maternal Medications:  Meds include: Other: insulin (started then stopped metformin 03/2018; NPH 40 u in AM, 42 U in PM, asparat 14-16 U post-prandial), aspirin Significant Maternal Lab Results: Lab values include: Group B Strep negative  Results for orders placed or performed during the hospital encounter of 06/29/18 (from the past 24 hour(s))  CBC   Collection Time: 06/29/18 12:23 PM  Result Value Ref Range   WBC 7.7 4.0 - 10.5 K/uL   RBC 4.36 3.87 - 5.11 MIL/uL   Hemoglobin 12.1 12.0 - 15.0 g/dL   HCT 36.8 36.0 - 46.0 %   MCV 84.4 80.0 - 100.0 fL   MCH 27.8 26.0 - 34.0 pg   MCHC 32.9 30.0 - 36.0 g/dL   RDW 15.0 11.5 - 15.5 %   Platelets 250 150 - 400 K/uL   nRBC 0.0 0.0 - 0.2 %    Patient Active Problem List   Diagnosis Date Noted  . Normal labor 06/29/2018  . Language barrier 06/08/2018  . Obesity in pregnancy 05/04/2018  . Type 2 diabetes mellitus during  pregnancy, antepartum 04/06/2018  . History of cesarean section 02/01/2018  . Seborrheic dermatitis 01/03/2017  . Shoulder dystocia 10/27/2014  . Maternal age 53+, multigravida, antepartum   . Supervision of high-risk pregnancy 07/07/2014  . History of preterm delivery 06/10/2014    Assessment/Plan:  Kara Dominguez is a 37 y.o. [id number] at 35w0dhere for labor after ROM at home.  #Labor:active labor, expectant management #Pain: IV fentanyl #FWB: Category I #ID:  GBS neg #MOF: breast #MOC: depo #Circ:  No #Type  2 diabetes: FBG q1hr in active labor #TOLAC: done in [place] for post dates, one vaginal delivery prior and 2 VBACs. Calculated success rate ~80%. Will ctm. #history of shoulder dystocia: fetus grown at >90% yesterday, will prepare for shoulder dystocia  Azucena Dart L, MD  06/29/2018, 12:45 PM

## 2018-06-29 NOTE — Anesthesia Pain Management Evaluation Note (Signed)
  CRNA Pain Management Visit Note  Patient: Kara Dominguez, 37 y.o., female  "Hello I am a member of the anesthesia team at [facility]. We have an anesthesia team available at all times to provide care throughout the hospital, including epidural management and anesthesia for C-section. I don't know your plan for the delivery whether it a natural birth, water birth, IV sedation, nitrous supplementation, doula or epidural, but we want to meet your pain goals."   1.Was your pain managed to your expectations on prior hospitalizations?   Yes   2.What is your expectation for pain management during this hospitalization?     Labor support without medications  3.How can we help you reach that goal? Support prn  Record the patient's initial score and the patient's pain goal.   Pain:   Pain Goal: 10   Pt spanish speaking and 7.5 cm dilated.  Bedside RN reports patient requests labor support without medication. The [facility] wants you to be able to say your pain was always managed very well.  [facility] 06/29/2018

## 2018-06-30 LAB — GLUCOSE, CAPILLARY: GLUCOSE-CAPILLARY: 96 mg/dL (ref 70–99)

## 2018-06-30 LAB — RPR: RPR Ser Ql: NONREACTIVE

## 2018-06-30 NOTE — Progress Notes (Signed)
Post Partum Day 1 Subjective: no complaints, up ad lib, voiding, tolerating PO and no flatus or BM yet  Objective: Blood pressure 101/63, pulse 83, temperature 98.8 F (37.1 C), temperature source Oral, resp. rate 18, weight 101.9 kg, last menstrual period 10/06/2017, SpO2 99 %, unknown if currently breastfeeding.  Physical Exam:  General: alert, cooperative, appears stated age and no distress Lochia: appropriate Uterine Fundus: firm Incision: NA DVT Evaluation: No evidence of DVT seen on physical exam.  Recent Labs    06/29/18 1223  HGB 12.1  HCT 36.8    Assessment/Plan: Plan for discharge tomorrow   LOS: 1 day   Gwenevere Abbot 06/30/2018, 4:03 PM

## 2018-07-01 DIAGNOSIS — Z3A38 38 weeks gestation of pregnancy: Secondary | ICD-10-CM

## 2018-07-01 DIAGNOSIS — O2412 Pre-existing diabetes mellitus, type 2, in childbirth: Secondary | ICD-10-CM

## 2018-07-01 MED ORDER — IBUPROFEN 600 MG PO TABS: 600.0000 mg | ORAL_TABLET | Freq: Three times a day (TID) | ORAL | 0 refills | Status: DC

## 2018-07-01 MED ORDER — METFORMIN HCL 500 MG PO TABS: 500.0000 mg | ORAL_TABLET | Freq: Two times a day (BID) | ORAL | 0 refills | Status: DC

## 2018-07-01 MED ORDER — SENNOSIDES-DOCUSATE SODIUM 8.6-50 MG PO TABS: 1.0000 | ORAL_TABLET | Freq: Every day | ORAL | 0 refills | Status: DC

## 2018-07-01 NOTE — Discharge Instructions (Signed)
Please continue to monitor your sugars. You may alternate between checking a fasting sugar before breakfast in the morning and 2 hours after your largest meal of the day (checking once daily is fine!). Please keep a journal of the values and what time they were taken. We have also prescribed you metformin (diabetes medication) to continue taking atleast until your next post-partum visit, continuing this medication will depend on the sugar values you bring to your visit for the physician to review.

## 2018-07-01 NOTE — Discharge Summary (Addendum)
OB Discharge Summary     Patient Name: Kara Dominguez DOB: Jan 19, 1981 MRN: [id number]  Date of admission: 06/29/2018 Delivering MD: Darlina Rumpf   Date of discharge: 07/01/2018  Admitting diagnosis: 17WKS WATER BROKE Intrauterine pregnancy: [redacted]w[redacted]d    Secondary diagnosis:  Active Problems:   Normal labor  Additional problems: Successful VBAC, History of pre-term labor, AMA, history of shoulder dystocia, type 2 diabetes/A2GDM diagnosed at [redacted] weeks gestation.       Discharge diagnosis: Term Pregnancy Delivered, VBAC and Type 2 DM/A2GDM                                                                                              Post partum procedures:None   Augmentation: None   Complications: None  Hospital course:  Onset of Labor With Vaginal Delivery     37y.o. yo [id number] 311w0das admitted in Active Labor on 06/29/2018. Patient had an uncomplicated labor course as follows:  Membrane Rupture Time/Date: 9:30 AM ,06/29/2018   Intrapartum Procedures: Episiotomy: None [1]                                         Lacerations:     Patient had a delivery of a Viable infant. 06/29/2018  Information for the patient's newborn:  CaAjna, Moors0[[id number]]Delivery Method: Vag-Spont    Pateint had an uncomplicated postpartum course.  She is ambulating, tolerating a regular diet, passing flatus, and urinating well. Fasting glucose PPD#1 96. Please see follow up for further diabetic management. Patient is discharged home in stable condition on 07/01/18.   Physical exam  Vitals:   06/30/18 0458 06/30/18 1415 06/30/18 2227 07/01/18 0620  BP: 114/72 101/63 (!) 110/58 104/63  Pulse: 82 83 83 68  Resp: '18 18 18 18  ' Temp: 99.1 F (37.3 C) 98.8 F (37.1 C) 97.9 F (36.6 C) 98.5 F (36.9 C)  TempSrc: Oral Oral Oral Oral  SpO2:    98%  Weight:       General: alert, cooperative and no distress Lochia: appropriate Uterine Fundus: firm Incision: N/A DVT  Evaluation: No evidence of DVT seen on physical exam. No significant calf/ankle edema. Labs: Lab Results  Component Value Date   WBC 7.7 06/29/2018   HGB 12.1 06/29/2018   HCT 36.8 06/29/2018   MCV 84.4 06/29/2018   PLT 250 06/29/2018   CMP Latest Ref Rng & Units 03/05/2018  Glucose 65 - 99 mg/dL 124(H)  BUN 6 - 20 mg/dL 10  Creatinine 0.57 - 1.00 mg/dL 0.39(L)  Sodium 134 - 144 mmol/L 138  Potassium 3.5 - 5.2 mmol/L 4.1  Chloride 96 - 106 mmol/L 104  CO2 20 - 29 mmol/L 21  Calcium 8.7 - 10.2 mg/dL 9.0  Total Protein 6.0 - 8.5 g/dL 6.0  Total Bilirubin 0.0 - 1.2 mg/dL <0.2  Alkaline Phos 39 - 117 IU/L 103  AST 0 - 40 IU/L 27  ALT 0 - 32 IU/L 24    Discharge  instruction: per After Visit Summary and "Baby and Me Booklet".  After visit meds:  Allergies as of 07/01/2018   No Known Allergies     Medication List    STOP taking these medications   aspirin EC 81 MG tablet   insulin aspart 100 UNIT/ML injection Commonly known as:  novoLOG   insulin NPH Human 100 UNIT/ML injection Commonly known as:  HUMULIN N,NOVOLIN N   progesterone 200 MG capsule Commonly known as:  PROMETRIUM     TAKE these medications   ACCU-CHEK FASTCLIX LANCETS Misc 1 Units by Percutaneous route 4 (four) times daily.   ACCU-CHEK NANO SMARTVIEW w/Device Kit 1 kit by Subdermal route as directed. Check blood sugars for fasting, and two hours after breakfast, lunch and dinner (4 checks daily)   glucose blood test strip Use as instructed to check blood sugars   ibuprofen 600 MG tablet Commonly known as:  ADVIL,MOTRIN Take 1 tablet (600 mg total) by mouth 3 (three) times daily.   INSULIN SYRINGE 1CC/29G 29G X 1/2" 1 ML Misc 1 Device by Does not apply route 2 (two) times daily.   metFORMIN 500 MG tablet Commonly known as:  GLUCOPHAGE Take 1 tablet (500 mg total) by mouth 2 (two) times daily with a meal.   prenatal vitamin w/FE, FA 27-1 MG Tabs tablet Take 1 tablet by mouth daily.    senna-docusate 8.6-50 MG tablet Commonly known as:  Senokot-S Take 1 tablet by mouth daily.       Diet: routine diet  Activity: Advance as tolerated. Pelvic rest for 6 weeks.   Outpatient follow up4 weeks and in 2 weeks for a glucose check. Early diagnosis at 16 weeks with A2GDM vs pre-existing Type 2 DM. Fasting glucose PPD#1 wnl at 96. At discharge, patient will be continued on Metformin and informed to monitor her glucose daily (alternating fasting and 2 hours postprandial largest meal) while keeping a journal to bring these to her 2 week appointment. Continued therapy depending on values.    Follow up Appt:No future appointments. Follow up Visit:No follow-ups on file.    Postpartum contraception: Depo Provera  Newborn Data: Live born female  Birth Weight: 7 lb 12 oz (3515 g) APGAR: 8, 9  Newborn Delivery   Birth date/time:  06/29/2018 14:13:00 Delivery type:  VBAC, Spontaneous     Baby Feeding: Breast Disposition:home with mother   07/01/2018 Patriciaann Clan, DO  I confirm that I have verified the information documented in the student nurse midwife's note and that I have also personally reperformed the physical exam and all medical decision making activities.   Laury Deep, CNM 07/01/2018 6:01 PM

## 2018-07-01 NOTE — Lactation Note (Signed)
This note was copied from a baby's chart. Lactation Consultation Note Baby 35 hrs old. FOB stated he would interpret for mom. LC feels that mom could benefit from interpreter if has intense teaching. Encouraged to ask for interpreter if needed. Mom has 2 other children that she BF over 1 yr. Mom plans to breast and formula feed.   Baby on DPT. + DAT. Mom has been BF in bed in side lying poisition. Baby sleeping well after feedings. Discussed supplementing since baby on photo therapy. Will ask RN to set up DEBP. Mom has WIC. Discussed giving Gerber formula after BF. In Spanish gave mom feeding supplemental amounts according to hours of age. Mom encouraged to feed baby 8-12 times/24 hours and with feeding cues. Newborn feeding habits, behavior, STS, I&O, supply and demand discussed.  Encouraged to ask for assistance or has questions.  WH/LC brochure given w/resources, support groups and LC services.  Patient Name: Kara Dominguez GLOVF'I Date: 07/01/2018 Reason for consult: Initial assessment;Early term 37-38.6wks;Hyperbilirubinemia   Maternal Data Has patient been taught Hand Expression?: Yes Does the patient have breastfeeding experience prior to this delivery?: Yes  Feeding Feeding Type: Breast Fed  LATCH Score Latch: Grasps breast easily, tongue down, lips flanged, rhythmical sucking.  Audible Swallowing: A few with stimulation  Type of Nipple: Everted at rest and after stimulation  Comfort (Breast/Nipple): Soft / non-tender  Hold (Positioning): No assistance needed to correctly position infant at breast.  LATCH Score: 9  Interventions Interventions: Breast feeding basics reviewed;Support pillows;Position options;Breast massage;Hand express;Breast compression  Lactation Tools Discussed/Used WIC Program: Yes   Consult Status Consult Status: Follow-up Date: 07/02/18 Follow-up type: In-patient    Aisley Whan, Diamond Nickel 07/01/2018, 2:05 AM

## 2018-07-02 ENCOUNTER — Ambulatory Visit: Payer: Self-pay

## 2018-07-02 ENCOUNTER — Encounter: Payer: Self-pay | Admitting: Obstetrics & Gynecology

## 2018-07-02 ENCOUNTER — Other Ambulatory Visit: Payer: Self-pay

## 2018-07-02 NOTE — Lactation Note (Signed)
This note was copied from a baby's chart. Lactation Consultation Note  Patient Name: Kara Dominguez Date: 07/02/2018 Reason for consult: Follow-up assessment;Early term 37-38.6wks;Hyperbilirubinemia  P5 mother whose infant is now 55 hours old.  This is an ETI at 38+0 weeks and under phototherapy.  Mother has no questions/concerns related to breast feeding.  She states that her nipples are a little bit sore.  I reinforced using EBM and her comfort gels (which she currently is using).  I also offered to assist with the next latch if mother feels pain with latching.  She will call as needed for assistance.   Maternal Data Formula Feeding for Exclusion: No Has patient been taught Hand Expression?: Yes Does the patient have breastfeeding experience prior to this delivery?: Yes  Feeding Feeding Type: Formula Nipple Type: Regular  LATCH Score                   Interventions    Lactation Tools Discussed/Used     Consult Status Consult Status: Follow-up Date: 07/03/18 Follow-up type: In-patient    Latajah Thuman R Kerri-Anne Haeberle 07/02/2018, 5:55 PM

## 2018-07-02 NOTE — Lactation Note (Signed)
This note was copied from a baby's chart. Lactation Consultation Note Baby 69 hrs old. On triple photo therapy. Mom is supplementing w/Gerber formula. Encouraged to give formula q 3 hrs. Discussed how much to give.  Discussed w/mom giving colostrum/BM. Encouraged to pump. Mom agree to pump. DEBP and kit taken to rm. Asked RN to set up for mom. Mom has short shaft nipples w/positonal stripes. Nipples tender. Comfort gels given. Shells given. Mom stated she has her bra. Encouraged to wear w/shells. Reviewed w/mom how much supplement to give at hours of age. Mom verbalized teach back of plan.  Patient Name: Kara Dominguez [id number] Date: 07/02/2018 Reason for consult: Follow-up assessment;Hyperbilirubinemia   Maternal Data    Feeding Feeding Type: Formula Nipple Type: Slow - flow  LATCH Score Latch: (instructed to call at next BF for latch assessment)     Type of Nipple: Everted at rest and after stimulation  Comfort (Breast/Nipple): Filling, red/small blisters or bruises, mild/mod discomfort        Interventions Interventions: Breast feeding basics reviewed;Shells;Comfort gels;Breast compression;Adjust position  Lactation Tools Discussed/Used Tools: Shells;Pump;Comfort gels Shell Type: Inverted Breast pump type: Double-Electric Breast Pump   Consult Status Consult Status: Follow-up Date: 07/02/18 Follow-up type: In-patient    Theodoro Kalata 07/02/2018, 4:29 AM

## 2018-07-03 ENCOUNTER — Ambulatory Visit: Payer: Self-pay

## 2018-07-03 NOTE — Lactation Note (Addendum)
This note was copied from a baby's chart. Lactation Consultation Note: Nettie Elm Spanish interpreter present for my visit. Mom reports very sore nipples especially the right one. Positional crack noted with scabbing. Attempted to latch baby but he came off the breast in a few minutes with blood on his lips. Mom did not want to put baby back on the breast. Pumped and obtained over 30 ml of bloody milk. Used # 27 flanges and mom reports that feels better .WIC loaner pump for home completed. Encouraged to nurse or pump q 2-3 hours to prevent engorgement. No questions at present. I will send [facility] referral for DEBP   Patient Name: Kara Dominguez [id number] Date: 07/03/2018 Reason for consult: Follow-up assessment   Maternal Data Formula Feeding for Exclusion: No Has patient been taught Hand Expression?: Yes Does the patient have breastfeeding experience prior to this delivery?: Yes  Feeding Feeding Type: Breast Fed Nipple Type: Regular  LATCH Score Latch: Grasps breast easily, tongue down, lips flanged, rhythmical sucking.  Audible Swallowing: A few with stimulation  Type of Nipple: Everted at rest and after stimulation  Comfort (Breast/Nipple): Filling, red/small blisters or bruises, mild/mod discomfort  Hold (Positioning): Assistance needed to correctly position infant at breast and maintain latch.  LATCH Score: 7  Interventions    Lactation Tools Discussed/Used WIC Program: Yes   Consult Status Consult Status: Complete    Kara Dominguez 07/03/2018, 9:55 AM

## 2018-08-08 ENCOUNTER — Other Ambulatory Visit: Payer: Self-pay | Admitting: General Practice

## 2018-08-08 DIAGNOSIS — O24119 Pre-existing diabetes mellitus, type 2, in pregnancy, unspecified trimester: Secondary | ICD-10-CM

## 2018-08-14 ENCOUNTER — Encounter: Payer: Self-pay | Admitting: Student

## 2018-08-14 ENCOUNTER — Other Ambulatory Visit: Payer: Self-pay

## 2018-08-14 ENCOUNTER — Ambulatory Visit (INDEPENDENT_AMBULATORY_CARE_PROVIDER_SITE_OTHER): Payer: Self-pay | Admitting: Student

## 2018-08-14 DIAGNOSIS — O24119 Pre-existing diabetes mellitus, type 2, in pregnancy, unspecified trimester: Secondary | ICD-10-CM

## 2018-08-14 LAB — POCT PREGNANCY, URINE: Preg Test, Ur: NEGATIVE

## 2018-08-14 MED ORDER — NORETHINDRONE 0.35 MG PO TABS: 1.0000 | ORAL_TABLET | Freq: Every day | ORAL | 11 refills | Status: DC

## 2018-08-14 NOTE — Patient Instructions (Signed)
Informacin sobre los Civil Service fast streameranticonceptivos orales (Oral Contraception Information) Los anticonceptivos orales (ACO) son medicamentos que se utilizan para Location managerevitar el embarazo. Su funcin es ALLTEL Corporationevitar que los ovarios liberen vulos. Las hormonas de los ACO tambin hacen que el moco cervical se haga ms espeso, lo que evita que el esperma ingrese al tero. Tambin hacen que la membrana que recubre internamente al tero se vuelva ms fina, lo que no permite que el huevo fertilizado se adhiera a la pared del tero. Los ACO son muy efectivos cuando se toman exactamente como se prescriben. Sin embargo, no previenen contra las enfermedades de transmisin sexual (ETS). La prctica del sexo seguro, como el uso de preservativos, junto con la Tutuillapldora, Egypt[place]udan a prevenir ese tipo de enfermedades. Antes de tomar la pldora, usted debe hacerse un examen fsico y un test de Pap. El mdico podr indicarle anlisis de [place], si es necesario. El mdico se asegurar de que usted sea [place] buena candidata para usar anticonceptivos orales. Converse con su mdico acerca de los posibles efectos secundarios de los ACO que podran recetarle. Cuando se inicia el uso de ACO, puede llevar 2 a 3 meses para que su organismo se adapte a los cambios en los niveles hormonales. TIPOS DE ANTICONCEPTIVOS ORALES  Pldora combinada: esta pldora contiene las hormonas estrgeno y progestina (progesterona sinttica). La pldora combinada viene en envases para [address] das, [address]28 das o [address] das. Algunos tipos de pldoras combinadas deben tomarse de manera continua (pldoras para 365 das). En los envases para [address]21 das, usted no tomar las pldoras durante 7 [address]  [address] despus de la ltima pldora. En los envases para [address]28 das, la pldora se toma CarMaxtodos los das. Las ltimas 7 no contienen hormonas. Ciertos tipos de pldoras tienen ms de 21 pldoras que contienen hormonas. En los envases para [address]91 das, las primeras 84 pldoras contienen ambas hormonas y las ltimas 7 pldoras no  contienen hormonas o contienen slo Cabin crewestrgenos.  La minipldora: esta pldora contiene la hormona progesterona solamente. Es necesario tomarla todos los das de [place]manera continua. Es importante que las tome a la misma hora todos Inyokernlos das. Viene en envases de 28 pldoras. Las 28 pldoras contienen la hormona. VENTAJAS DE LOS ANTICONCEPTIVOS ORALES  Disminuye los sntomas premenstruales.  Se [place]a para tratar los Best Buyclicos menstruales.  Regula el ciclo menstrual.  Disminuye el ciclo menstrual abundante.  Puede mejorar el acn, segn el tipo de pldora.  Trata hemorragias uterinas anormales.  Trata el sndrome ovrico poliqustico.  Trata la endometriosis.  Pueden usarse como anticonceptivo de Associate Professoremergencia. FACTORES QUE PUEDEN HACER QUE LOS ANTICONCEPTIVOS ORALES SEAN MENOS EFECTIVOS Pueden ser menos efectivos si:  Olvid tomar la J. C. Penneypldora todos los das a la misma hora.  Tiene una enfermedad estomacal o intestinal que disminuye la absorcin de la pldora.  Ingiere simultneamente los anticonceptivos orales junto con otros medicamentos que los hacen menos efectivos, como antibiticos, ciertos medicamentos para el VIH y algunos medicamentos para las convulsiones.  Usted toma anticonceptivos orales que han vencido.  Cuando se [place]a el envase de [place] das, se olvida de recomenzar el uso American Expressen el da 7. RIESGOS ASOCIADOS AL USO DE ANTICONCEPTIVOS ORALES Los anticonceptivos orales pueden en algunos casos causar efectos secundarios como:  Dolor de Turkmenistancabeza.  Nuseas.  Inflamacin mamaria.  Hemorragia vaginal o manchado irregular. Las pldoras combinadas tambin se asocian a un pequeo aumento en el riesgo de:  Cogulos sanguneos.  Ataque cardaco.  Ictus. Esta informacin no tiene Theme park managercomo fin reemplazar el consejo del mdico. Asegrese de  se asocian a un pequeño aumento en el riesgo de:  · Coágulos sanguíneos.  · Ataque cardíaco.  · Ictus.  Esta información no tiene como fin reemplazar el consejo del médico. Asegúrese de hacerle al médico cualquier pregunta que tenga.  Document Released: 06/08/2005 Document Revised: 12/21/2015 Document Reviewed: 02/17/2013   Elsevier Interactive Patient Education © 2018 Elsevier Inc.

## 2018-08-14 NOTE — Addendum Note (Signed)
Addended by: Chrystie NoseKOOISTRA, Eldar Robitaille L on: 08/14/2018 08:50 AM   Modules accepted: Orders

## 2018-08-14 NOTE — Progress Notes (Addendum)
Subjective:     [name] is a 37 y.o. female who presents for a postpartum visit. She is 6 week postpartum following a spontaneous vaginal delivery. I have fully reviewed the prenatal and intrapartum course. The delivery was at [redacted] weeks gestational weeks. Outcome: vaginal birth after cesarean (VBAC). Anesthesia: none. Postpartum course has been uneventful. Baby's course has been uneventful. Baby is feeding by both breast and bottle - Lucien MonsGerber Good Start. Bleeding no bleeding. Bowel function is normal. Bladder function is normal. Patient is not sexually active. Contraception method is oral progesterone-only contraceptive. Postpartum depression screening: negative.  The following portions of the patient's history were reviewed and updated as appropriate: allergies, current medications, past family history, past medical history, past social history, past surgical history and problem list.  Review of Systems Pertinent items are noted in HPI.   Objective:    BP 115/67   Pulse 79   Wt 204 lb 3.2 oz (92.6 kg)   LMP 10/06/2017   BMI 32.96 kg/m   General:  alert, cooperative and no distress   Breasts:  inspection negative, no nipple discharge or bleeding, no masses or nodularity palpable  Lungs: clear to auscultation bilaterally  Heart:  regular rate and rhythm, S1, S2 normal, no murmur, click, rub or gallop  Abdomen: soft, non-tender; bowel sounds normal; no masses,  no organomegaly   Vulva:  normal  Vagina: normal vagina  Cervix:  not evaluated  Corpus: not examined  Adnexa:  not evaluated  Rectal Exam: Not performed.        Assessment:     Healthy  postpartum exam. Pap smear not done at today's visit.   Plan:    1. Contraception: oral progesterone-only contraceptive 2. Doing well desires POP after learning about possibility of decreased milk production with estrogen pills. Recommended health department clinic if she desires different BC method.  -2 hour PP GTT in process.   3. Follow up in: 1 year or as needed.

## 2018-08-15 LAB — GLUCOSE TOLERANCE, 2 HOURS
GLUCOSE FASTING GTT: 94 mg/dL (ref 65–99)
Glucose, 2 hour: 192 mg/dL — ABNORMAL HIGH (ref 65–139)

## 2018-08-17 ENCOUNTER — Telehealth: Payer: Self-pay | Admitting: *Deleted

## 2018-08-17 NOTE — Telephone Encounter (Signed)
Called pt using interpreter [phone] to inform her that she does have diabetes and needs to follow up with a PCP for management. Pt not available on number provided to office but the gentleman who picked up reported she could be reached on [phone].  Pt answered at the new number given and pt verbalized understanding.

## 2018-08-17 NOTE — Telephone Encounter (Signed)
-----   Message from Kara LandKathryn Lorraine Kooistra, CNM sent at 08/16/2018 12:14 PM EST ----- Good afternoon; Please call this patient and tell her that she does indeed have diabetes and needs to follow up with PCP.

## 2018-09-21 ENCOUNTER — Other Ambulatory Visit (HOSPITAL_COMMUNITY): Payer: Self-pay | Admitting: Family Medicine

## 2018-11-28 ENCOUNTER — Other Ambulatory Visit: Payer: Self-pay

## 2018-11-28 ENCOUNTER — Emergency Department (HOSPITAL_COMMUNITY): Payer: Self-pay

## 2018-11-28 ENCOUNTER — Emergency Department (HOSPITAL_COMMUNITY)
Admission: EM | Admit: 2018-11-28 | Discharge: 2018-11-28 | Disposition: A | Discharge status: Home / Self Care | Payer: Self-pay | Attending: Emergency Medicine | Admitting: Emergency Medicine

## 2018-11-28 ENCOUNTER — Encounter (HOSPITAL_COMMUNITY): Payer: Self-pay | Admitting: *Deleted

## 2018-11-28 ENCOUNTER — Emergency Department (HOSPITAL_BASED_OUTPATIENT_CLINIC_OR_DEPARTMENT_OTHER)
Admit: 2018-11-28 | Discharge: 2018-11-28 | Disposition: A | Discharge status: Home / Self Care | Payer: Self-pay | Attending: Emergency Medicine | Admitting: Emergency Medicine

## 2018-11-28 DIAGNOSIS — Y999 Unspecified external cause status: Secondary | ICD-10-CM | POA: Insufficient documentation

## 2018-11-28 DIAGNOSIS — Y929 Unspecified place or not applicable: Secondary | ICD-10-CM | POA: Insufficient documentation

## 2018-11-28 DIAGNOSIS — W109XXA Fall (on) (from) unspecified stairs and steps, initial encounter: Secondary | ICD-10-CM | POA: Insufficient documentation

## 2018-11-28 DIAGNOSIS — S8012XA Contusion of left lower leg, initial encounter: Secondary | ICD-10-CM | POA: Insufficient documentation

## 2018-11-28 DIAGNOSIS — W108XXA Fall (on) (from) other stairs and steps, initial encounter: Secondary | ICD-10-CM

## 2018-11-28 DIAGNOSIS — Y93E2 Activity, laundry: Secondary | ICD-10-CM | POA: Insufficient documentation

## 2018-11-28 DIAGNOSIS — R52 Pain, unspecified: Secondary | ICD-10-CM

## 2018-11-28 DIAGNOSIS — E119 Type 2 diabetes mellitus without complications: Secondary | ICD-10-CM | POA: Insufficient documentation

## 2018-11-28 DIAGNOSIS — Z7984 Long term (current) use of oral hypoglycemic drugs: Secondary | ICD-10-CM | POA: Insufficient documentation

## 2018-11-28 NOTE — Progress Notes (Signed)
Left lower extremity venous duplex completed.  Refer to "CV Proc" under chart review to view preliminary results.  11/28/2018 4:16 PM Gertie Fey, MHA, RVT, RDCS, RDMS

## 2018-11-28 NOTE — ED Provider Notes (Signed)
[facility] Provider Note   CSN: [id number] Arrival date & time: 11/28/18  1516    History   Chief Complaint Chief Complaint  Patient presents with  . Leg Pain    HPI Kara Dominguez is a 38 y.o. female who presents to the ED with cc of left leg pain after fall. Her injury occurred on 11/22/2018. She was carrying a basket of laundry when she lost her footing and slid down the stairs on her left lower extremity with the leg under her body and her knee flexed. The pain she was able to bear weight. The pain  And bruising have progressively worsened. She describes the pain as throbbing, non-radiating. It is constant and worse at night. She has used ibuprofen with moderate relief. She has some swelling in the knee. She does not have joint pain in the knee. She denies hitting her head or loosing consciousness. She denies other injuries. She denies cp or sob.      HPI  Past Medical History:  Diagnosis Date  . Gestational diabetes   . Preterm labor     Patient Active Problem List   Diagnosis Date Noted  . Normal labor 06/29/2018  . Language barrier 06/08/2018  . Obesity in pregnancy 05/04/2018  . Type 2 diabetes mellitus during pregnancy, antepartum 04/06/2018  . History of cesarean section 02/01/2018  . Seborrheic dermatitis 01/03/2017  . Shoulder dystocia 10/27/2014  . Maternal age 45+, multigravida, antepartum   . History of preterm delivery 06/10/2014    Past Surgical History:  Procedure Laterality Date  . CESAREAN SECTION       OB History    Gravida  5   Para  5   Term  4   Preterm  1   AB      Living  5     SAB      TAB      Ectopic      Multiple  0   Live Births  5            Home Medications    Prior to Admission medications   Medication Sig Start Date End Date Taking? Authorizing Provider  ACCU-CHEK FASTCLIX LANCETS MISC 1 Units by Percutaneous route 4 (four) times daily. Patient not taking:  Reported on 08/14/2018 03/05/18   Truett Mainland, DO  Blood Glucose Monitoring Suppl (ACCU-CHEK NANO SMARTVIEW) w/Device KIT 1 kit by Subdermal route as directed. Check blood sugars for fasting, and two hours after breakfast, lunch and dinner (4 checks daily) Patient not taking: Reported on 08/14/2018 03/05/18   Truett Mainland, DO  glucose blood (ACCU-CHEK SMARTVIEW) test strip Use as instructed to check blood sugars Patient not taking: Reported on 08/14/2018 03/05/18   Truett Mainland, DO  ibuprofen (ADVIL,MOTRIN) 600 MG tablet Take 1 tablet (600 mg total) by mouth 3 (three) times daily. 07/01/18   Patriciaann Clan, DO  INSULIN SYRINGE 1CC/29G 29G X 1/2" 1 ML MISC 1 Device by Does not apply route 2 (two) times daily. Patient not taking: Reported on 08/14/2018 04/10/18   Woodroe Mode, MD  metFORMIN (GLUCOPHAGE) 500 MG tablet Take 1 tablet (500 mg total) by mouth 2 (two) times daily with a meal. 07/01/18   Patriciaann Clan, DO  norethindrone (MICRONOR,CAMILA,ERRIN) 0.35 MG tablet Take 1 tablet (0.35 mg total) by mouth daily. 08/14/18   Starr Lake, CNM  prenatal vitamin w/FE, FA (PRENATAL 1 + 1) 27-1 MG TABS  tablet Take 1 tablet by mouth daily. 12/09/17   Poe, Deirdre C, CNM  senna-docusate (SENOKOT-S) 8.6-50 MG tablet Take 1 tablet by mouth daily. 07/01/18   Patriciaann Clan, DO    Family History History reviewed. No pertinent family history.  Social History Social History   Tobacco Use  . Smoking status: Never Smoker  . Smokeless tobacco: Never Used  Substance Use Topics  . Alcohol use: No  . Drug use: No     Allergies   Patient has no known allergies.   Review of Systems Review of Systems  Ten systems reviewed and are negative for acute change, except as noted in the HPI.   Physical Exam Updated Vital Signs BP (!) 138/115 (BP Location: Right Arm) Comment: repeat, PA aware  Pulse 90   Temp 98.3 F (36.8 C) (Oral)   Resp 16   Ht 5' 3" (1.6 m)   Wt 89.8 kg    SpO2 98%   BMI 35.07 kg/m   Physical Exam Vitals signs and nursing note reviewed.  Constitutional:      General: She is not in acute distress.    Appearance: She is well-developed. She is not diaphoretic.  HENT:     Head: Normocephalic and atraumatic.  Eyes:     General: No scleral icterus.    Conjunctiva/sclera: Conjunctivae normal.  Neck:     Musculoskeletal: Normal range of motion.  Cardiovascular:     Rate and Rhythm: Normal rate and regular rhythm.     Heart sounds: Normal heart sounds. No murmur. No friction rub. No gallop.   Pulmonary:     Effort: Pulmonary effort is normal. No respiratory distress.     Breath sounds: Normal breath sounds.  Abdominal:     General: Bowel sounds are normal. There is no distension.     Palpations: Abdomen is soft. There is no mass.     Tenderness: There is no abdominal tenderness. There is no guarding.  Skin:    General: Skin is warm and dry.     Comments: LLE with swelling, pitting edema in the ankle.  Ecchymosis in various stages of degradation- appears old. R knee without obvious edema, no crepitus with movement. FROM and normal strength. No obvious joint laxity. NVI  Neurological:     Mental Status: She is alert and oriented to person, place, and time.  Psychiatric:        Behavior: Behavior normal.      ED Treatments / Results  Labs (all labs ordered are listed, but only abnormal results are displayed) Labs Reviewed - No data to display  EKG None  Radiology No results found.  Procedures Procedures (including critical care time)  Medications Ordered in ED Medications - No data to display   Initial Impression / Assessment and Plan / ED Course  I have reviewed the triage vital signs and the nursing notes.  Pertinent labs & imaging results that were available during my care of the patient were reviewed by me and considered in my medical decision making (see chart for details).      Patient with left lower  extremity pain after injury.  I personally reviewed the patient's knee and tib-fib x-ray which are negative for acute abnormality. Her ultrasound also shows no evidence of DVT. Patient appears to have contusion after her fall.  She is ambulatory.  He may continue to take Tylenol Motrin for pain..  Discussed return precautions appears appropriate for discharge at this time  Final Clinical  Impressions(s) / ED Diagnoses   Final diagnoses:  None    ED Discharge Orders    None       Margarita Mail, PA-C 11/28/18 2250    Varney Biles, MD 12/02/18 [phone]

## 2018-11-28 NOTE — ED Triage Notes (Signed)
Pt reports a fall and Lt below the knee leg  Is red and swollen. Pt reports she fell Thursday last week. Pt reports falling down steps in house.

## 2018-11-28 NOTE — ED Notes (Signed)
RN reviewed discharge instructions and f/u information with patient using Spanish interpreter. Patient verbalized understanding and denied any further needs or questions. VSS. Patient ambulatory with steady gait.

## 2018-11-28 NOTE — Discharge Instructions (Addendum)
Your x-rays and ultrasound study of the leg are negative.  You appear to have bruising of the leg from your fall.  Continue to elevate the leg and apply ice.  You may take Tylenol or ibuprofen for pain.  Return immediately if you develop signs of infection such as redness, swelling, heat in the leg, and severe pain out of proportion to the injury that occurred.  Also return if you develop new onset chest pain, shortness of breath or bloody cough.  Otherwise he may follow-up with your primary care physician in the next 7 to 10 days.  Return sooner for any new or worsening symptoms.

## 2018-12-04 ENCOUNTER — Ambulatory Visit: Payer: Medicaid Other | Admitting: Nurse Practitioner

## 2019-01-14 ENCOUNTER — Encounter: Payer: Self-pay | Admitting: Nurse Practitioner

## 2019-01-14 ENCOUNTER — Ambulatory Visit: Payer: Self-pay | Attending: Nurse Practitioner | Admitting: Nurse Practitioner

## 2019-01-14 ENCOUNTER — Other Ambulatory Visit: Payer: Self-pay

## 2019-01-14 ENCOUNTER — Ambulatory Visit: Payer: Self-pay | Admitting: Nurse Practitioner

## 2019-01-14 DIAGNOSIS — Z8632 Personal history of gestational diabetes: Secondary | ICD-10-CM

## 2019-01-14 MED ORDER — GLUCOSE BLOOD VI STRP: ORAL_STRIP | 12 refills | Status: DC

## 2019-01-14 MED ORDER — ACCU-CHEK FASTCLIX LANCETS MISC: 12 refills | Status: DC

## 2019-01-14 NOTE — Progress Notes (Signed)
Virtual Visit via Telephone Note Due to national recommendations of social distancing due to COVID 19, telehealth visit is felt to be most appropriate for this patient at this time.  I discussed the limitations, risks, security and privacy concerns of performing an evaluation and management service by telephone and the availability of in person appointments. I also discussed with the patient that there may be a patient responsible charge related to this service. The patient expressed understanding and agreed to proceed.    I connected with Kara Dominguez on 01/14/19  at  3:17 PM EDT by telephone and verified that I am speaking with the correct person using two identifiers.  Consent I discussed the limitations, risks, security and privacy concerns of performing an evaluation and management service by telephone and the availability of in person appointments. I also discussed with the patient that there may be a patient responsible charge related to this service. The patient expressed understanding and agreed to proceed.   Location of Patient: Private Residence    Location of Provider: [facility]    Persons participating in Telemedicine visit: Bertram Denver FNP-BC Kara Dominguez CMA Kara Dominguez    History of Present Illness: Telemedicine visit for: Establish Care Spanish Interpreter ID # [id number] Kara Dominguez  She has a history of Gestational Diabetes. Denies any other PMH aside from anemia. She would like to verify if she still has diabetes. She is 8 months post partum. She has been diagnosed gestational diabetes with her other pregnancies as well. [id number] She denies polydipsia but endorses polyuria. Will schedule her for a lab appointment   Past Medical History:  Diagnosis Date  . Anemia   . Gestational diabetes   . Preterm labor     Past Surgical History:  Procedure Laterality Date  . CESAREAN SECTION      History reviewed. No pertinent family  history.  Social History   Socioeconomic History  . Marital status: Single    Spouse name: Not on file  . Number of children: 4  . Years of education: Not on file  . Highest education level: Not on file  Occupational History  . Not on file  Social Needs  . Financial resource strain: Not hard at all  . Food insecurity:    Worry: Never true    Inability: Never true  . Transportation needs:    Medical: No    Non-medical: No  Tobacco Use  . Smoking status: Never Smoker  . Smokeless tobacco: Never Used  Substance and Sexual Activity  . Alcohol use: No  . Drug use: No  . Sexual activity: Not Currently    Birth control/protection: None  Lifestyle  . Physical activity:    Days per week: 0 days    Minutes per session: 0 min  . Stress: Not at all  Relationships  . Social connections:    Talks on phone: More than three times a week    Gets together: Once a week    Attends religious service: More than 4 times per year    Active member of club or organization: Yes    Attends meetings of clubs or organizations: Never    Relationship status: Living with partner  Other Topics Concern  . Not on file  Social History Narrative  . Not on file     Observations/Objective: Awake, alert and oriented x 3   Review of Systems  Constitutional: Negative for fever, malaise/fatigue and weight loss.  HENT: Negative.  Negative for nosebleeds.   Eyes: Negative.  Negative for blurred vision, double vision and photophobia.  Respiratory: Negative.  Negative for cough and shortness of breath.   Cardiovascular: Negative.  Negative for chest pain, palpitations and leg swelling.  Gastrointestinal: Negative.  Negative for heartburn, nausea and vomiting.  Genitourinary: Negative for dysuria, flank pain, frequency, hematuria and urgency.       Polyuria  Musculoskeletal: Negative.  Negative for myalgias.  Neurological: Negative.  Negative for dizziness, focal weakness, seizures and headaches.   Psychiatric/Behavioral: Negative.  Negative for suicidal ideas.    Assessment and Plan:  Diagnoses and all orders for this visit:  History of gestational diabetes mellitus -     Accu-Chek FastClix Lancets MISC; Use as instructed. Check blood glucose level by fingerstick once per day. -     glucose blood (ACCU-CHEK SMARTVIEW) test strip; Use as instructed to check blood sugars Continue blood sugar control as discussed in office today, low carbohydrate diet, and regular physical exercise as tolerated, 150 minutes per week (30 min each day, 5 days per week, or 50 min 3 days per week). Keep blood sugar logs with fasting goal of 90-130 mg/dl, post prandial (after you eat) less than 180.  For Hypoglycemia: BS <60 and Hyperglycemia BS >400; contact the clinic ASAP. Annual eye exams and foot exams are recommended.     Follow Up Instructions Return in about 3 weeks (around 02/04/2019) for Fasting labs.     I discussed the assessment and treatment plan with the patient. The patient was provided an opportunity to ask questions and all were answered. The patient agreed with the plan and demonstrated an understanding of the instructions.   The patient was advised to call back or seek an in-person evaluation if the symptoms worsen or if the condition fails to improve as anticipated.  I provided 23 minutes of non-face-to-face time during this encounter including median intraservice time, reviewing previous notes, labs, imaging, medications and explaining diagnosis and management.  Claiborne RiggZelda W Fleming, FNP-BC

## 2019-01-15 ENCOUNTER — Other Ambulatory Visit: Payer: Self-pay

## 2019-02-05 ENCOUNTER — Ambulatory Visit: Payer: Self-pay | Attending: Family Medicine

## 2019-02-05 ENCOUNTER — Other Ambulatory Visit: Payer: Self-pay

## 2019-02-05 ENCOUNTER — Other Ambulatory Visit: Payer: Self-pay | Admitting: Nurse Practitioner

## 2019-02-05 DIAGNOSIS — Z8632 Personal history of gestational diabetes: Secondary | ICD-10-CM

## 2019-02-06 LAB — LIPID PANEL
Chol/HDL Ratio: 3.4 ratio (ref 0.0–4.4)
Cholesterol, Total: 153 mg/dL (ref 100–199)
HDL: 45 mg/dL (ref >39)
LDL Calculated: 96 mg/dL (ref 0–99)
Triglycerides: 60 mg/dL (ref 0–149)
VLDL Cholesterol Cal: 12 mg/dL (ref 5–40)

## 2019-02-06 LAB — CBC
Hematocrit: 38.6 % (ref 34.0–46.6)
Hemoglobin: 12.9 g/dL (ref 11.1–15.9)
MCH: 29.2 pg (ref 26.6–33.0)
MCHC: 33.4 g/dL (ref 31.5–35.7)
MCV: 87 fL (ref 79–97)
Platelets: 265 10*3/uL (ref 150–450)
RBC: 4.42 x10E6/uL (ref 3.77–5.28)
RDW: 12.6 % (ref 11.7–15.4)
WBC: 7.5 10*3/uL (ref 3.4–10.8)

## 2019-02-06 LAB — CMP14+EGFR
ALT: 42 IU/L — ABNORMAL HIGH (ref 0–32)
AST: 30 IU/L (ref 0–40)
Albumin/Globulin Ratio: 1.8 (ref 1.2–2.2)
Albumin: 4.6 g/dL (ref 3.8–4.8)
Alkaline Phosphatase: 144 IU/L — ABNORMAL HIGH (ref 39–117)
BUN/Creatinine Ratio: 27 — ABNORMAL HIGH (ref 9–23)
BUN: 14 mg/dL (ref 6–20)
Bilirubin Total: 0.4 mg/dL (ref 0.0–1.2)
CO2: 24 mmol/L (ref 20–29)
Calcium: 9.4 mg/dL (ref 8.7–10.2)
Chloride: 105 mmol/L (ref 96–106)
Creatinine, Ser: 0.51 mg/dL — ABNORMAL LOW (ref 0.57–1.00)
GFR calc Af Amer: 140 mL/min/{1.73_m2} (ref >59)
GFR calc non Af Amer: 122 mL/min/{1.73_m2} (ref >59)
Globulin, Total: 2.5 g/dL (ref 1.5–4.5)
Glucose: 98 mg/dL (ref 65–99)
Potassium: 4.3 mmol/L (ref 3.5–5.2)
Sodium: 142 mmol/L (ref 134–144)
Total Protein: 7.1 g/dL (ref 6.0–8.5)

## 2019-02-06 LAB — HEMOGLOBIN A1C
Est. average glucose Bld gHb Est-mCnc: 117 mg/dL
Hgb A1c MFr Bld: 5.7 % — ABNORMAL HIGH (ref 4.8–5.6)

## 2019-02-28 NOTE — Progress Notes (Signed)
CMA spoke to patient regarding lab results and PCP advising.   Pt. Verified DOB. Pt. Understood.   Spanish pacific interpreter assist with the call.

## 2019-10-22 ENCOUNTER — Encounter: Payer: Self-pay | Admitting: *Deleted

## 2020-01-28 IMAGING — US US FETAL BPP W/ NON-STRESS
1 series · 13 of 15 positions shown · non-contrast
Comparison: none

[Series 1: us fetal bpp w/nonstress · 15 acquisitions, 13 frames shown]
[im 1/15]
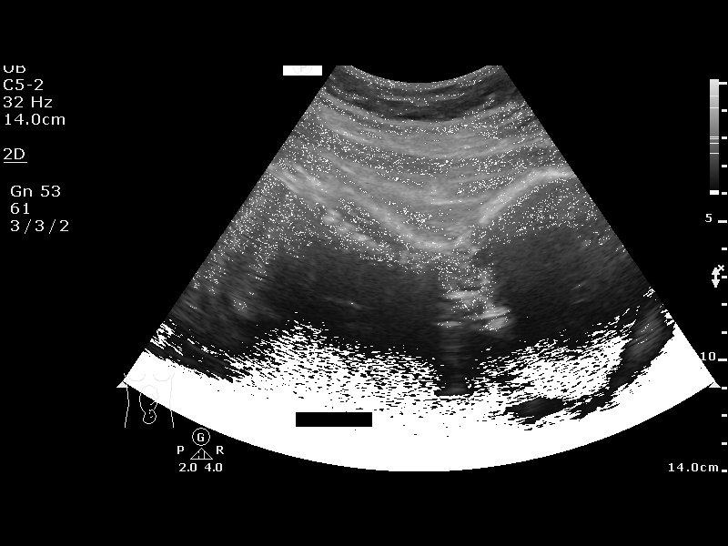
[im 2/15]
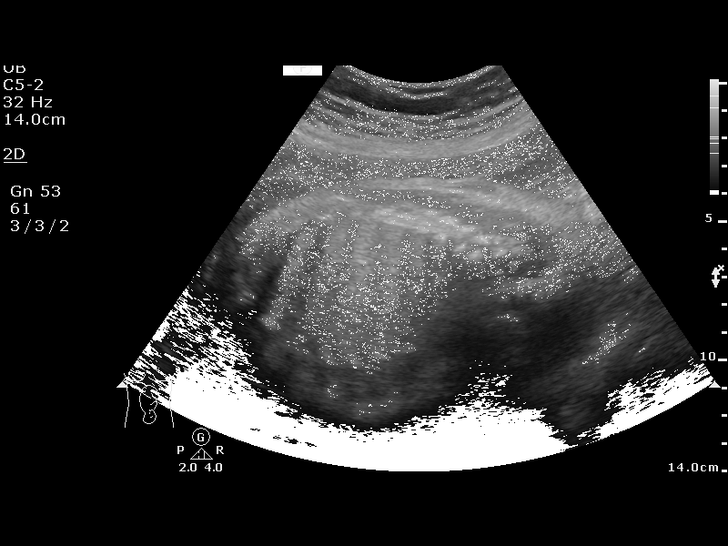
[im 3/15]
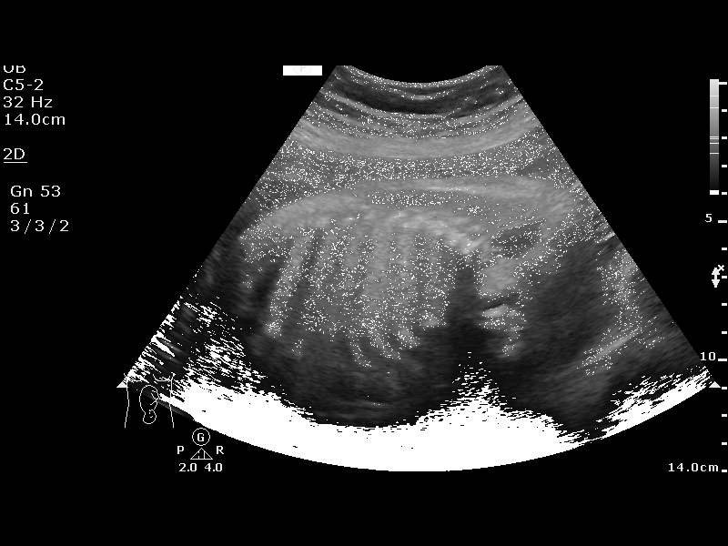
[im 5/15]
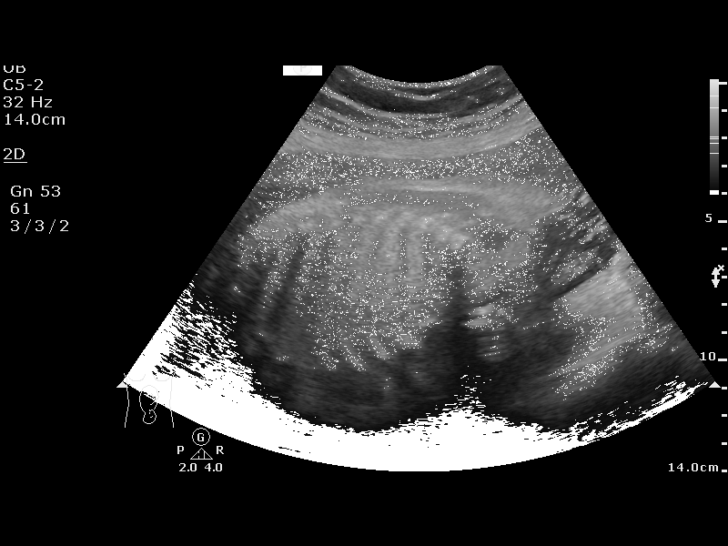
[im 6/15]
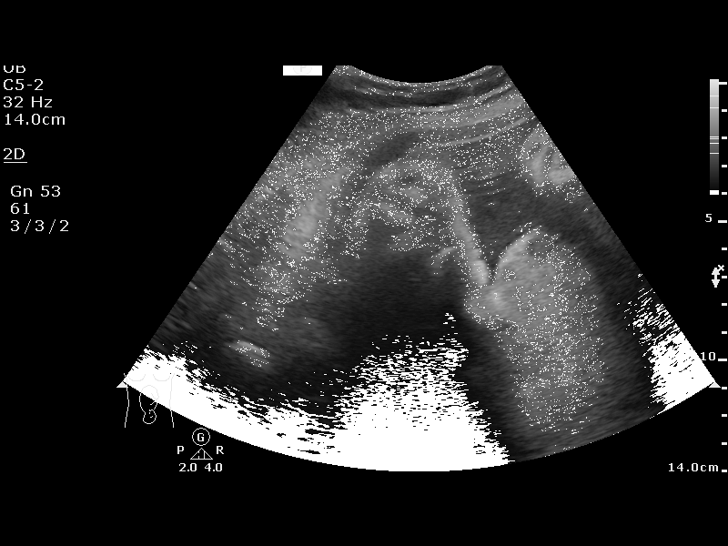
[im 7/15]
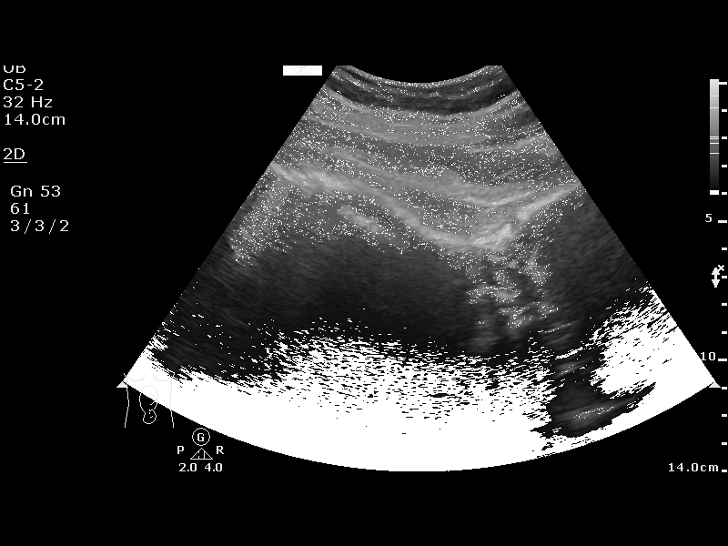
[im 8/15]
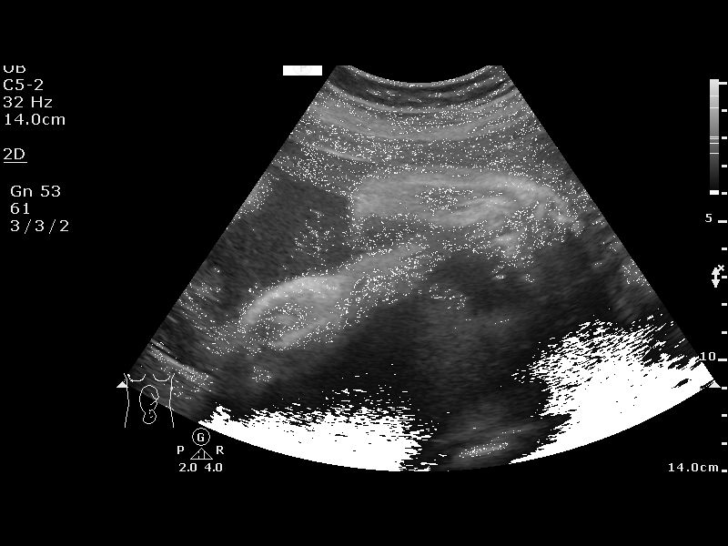
[im 9/15]
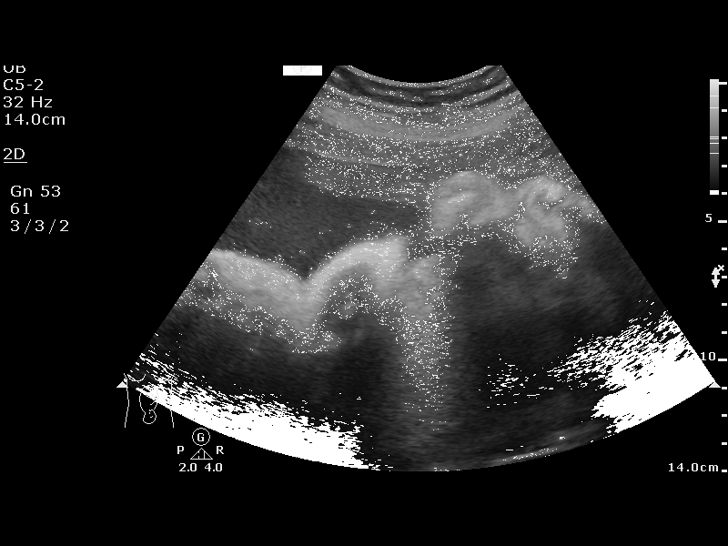
[im 10/15]
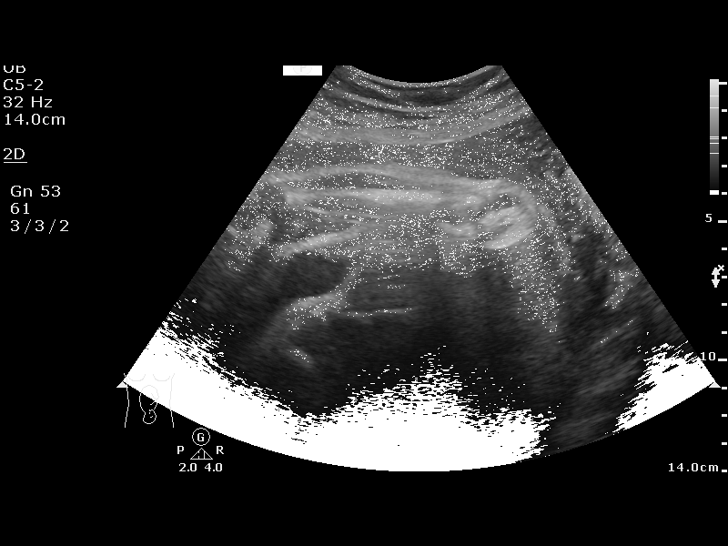
[im 11/15]
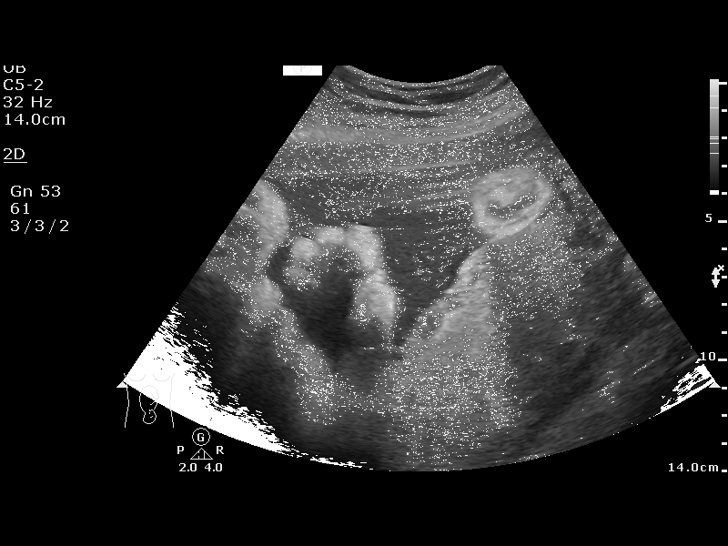
[im 13/15]
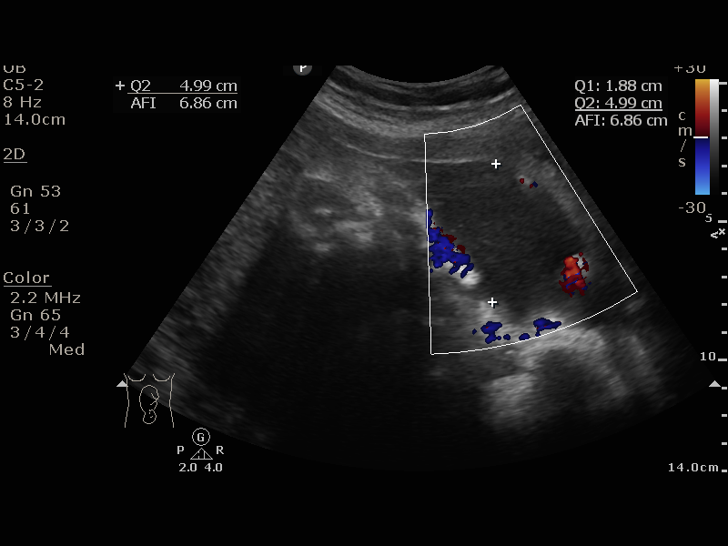
[im 14/15]
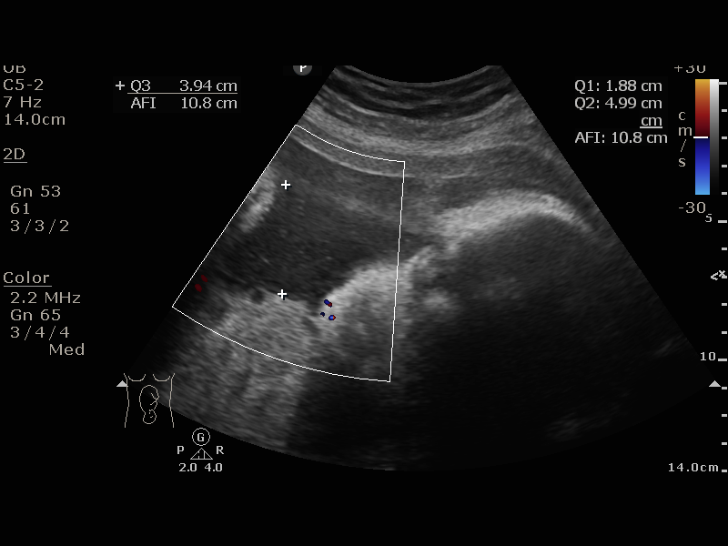
[im 15/15]
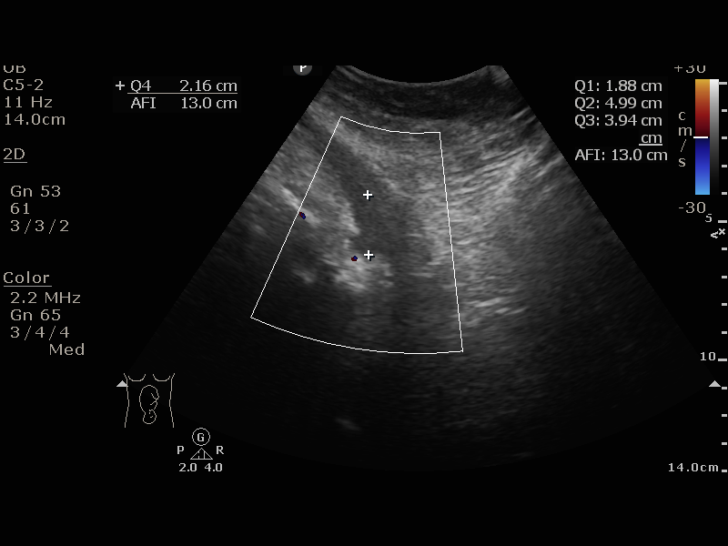

[13 of 15 positions shown; findings below may reference images not displayed]

[facility]
[REDACTED]

1  US FETAL BPP W/NONSTRESS             76818.4      KATUHIKO RIFLAN

Service(s) Provided

Indications

33 weeks gestation of pregnancy
Pre-existing diabetes, type 2, in pregnancy,
third trimester
Vital Signs

Height:        5'6"
Fetal Evaluation

Num Of Fetuses:         1
Preg. Location:         Intrauterine
Cardiac Activity:       Observed
Presentation:           Cephalic

Amniotic Fluid
AFI FV:      Within normal limits

AFI Sum(cm)     %Tile       Largest Pocket(cm)
12.97           40

RUQ(cm)       RLQ(cm)       LUQ(cm)        LLQ(cm)
1.88
Biophysical Evaluation

Amniotic F.V:   Pocket => 2 cm two         F. Tone:        Observed
planes
F. Movement:    Observed                   N.S.T:          Reactive
F. Breathing:   Observed                   Score:          [DATE]
OB History

Gravidity:    5         Term:   3        Prem:   1        SAB:   0
TOP:          0       Ectopic:  0        Living: 4
Gestational Age

LMP:           33w 0d        Date:  10/06/17                 EDD:   07/13/18
Best:          33w 0d     Det. By:  LMP  (10/06/17)          EDD:   07/13/18
Impression

Antenatal testing is reassuring. BPP [DATE]. Normal amniotic
fluid volume.
Recommendations

Continue weekly antenatal testing till delivery.

## 2020-02-11 IMAGING — US US MFM FETAL BPP W/O NON-STRESS
1 series · 14 of 28 positions shown · non-contrast
Comparison: none

[Series 1: us mfm fetal bpp w/o non-stress · 53 acquisitions, 14 frames shown]
[im 2/53]
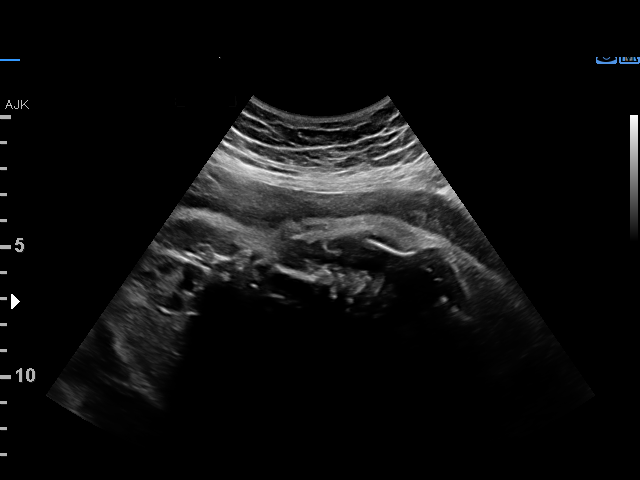
[im 6/53]
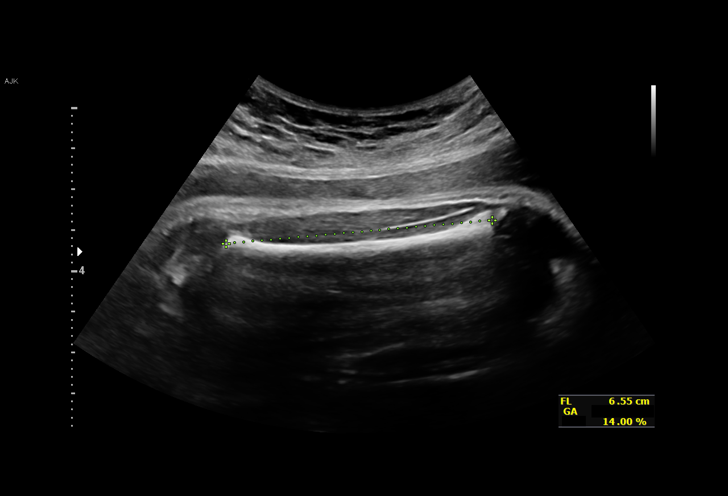
[im 10/53]
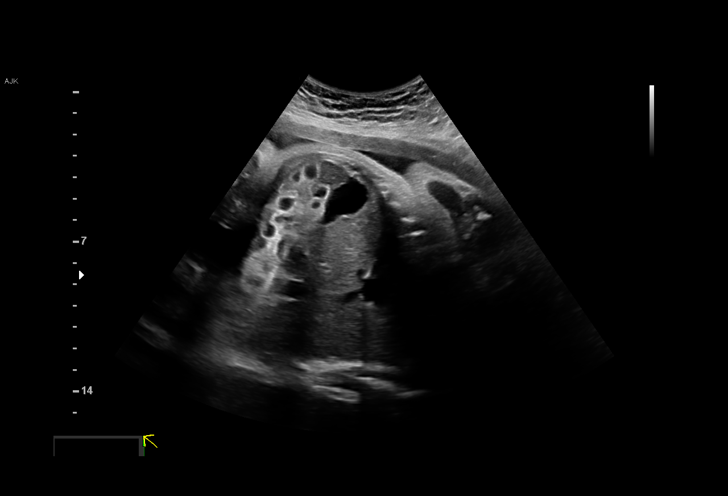
[im 14/53]
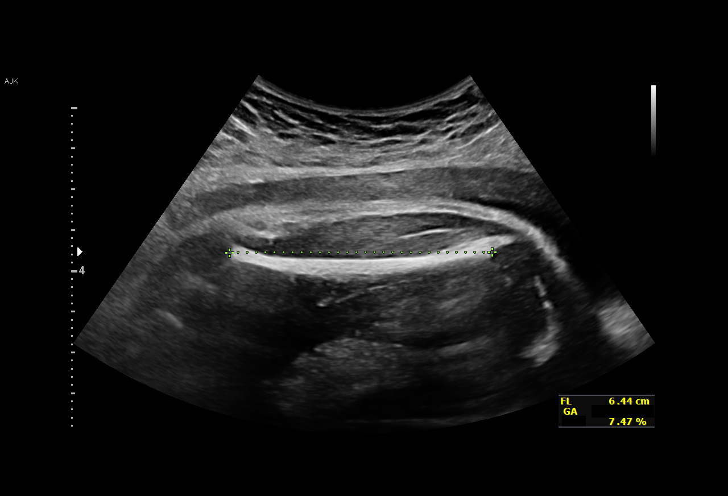
[im 18/53]
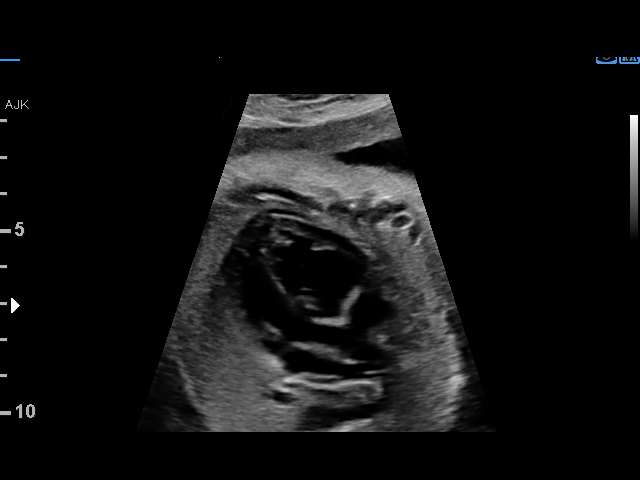
[im 22/53]
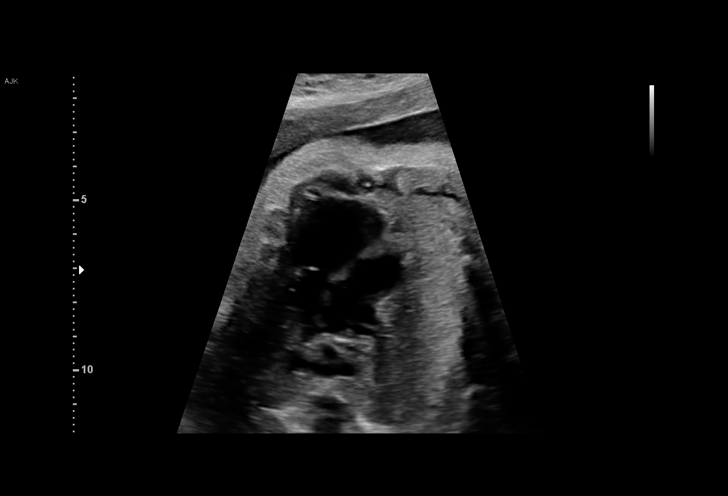
[im 26/53]
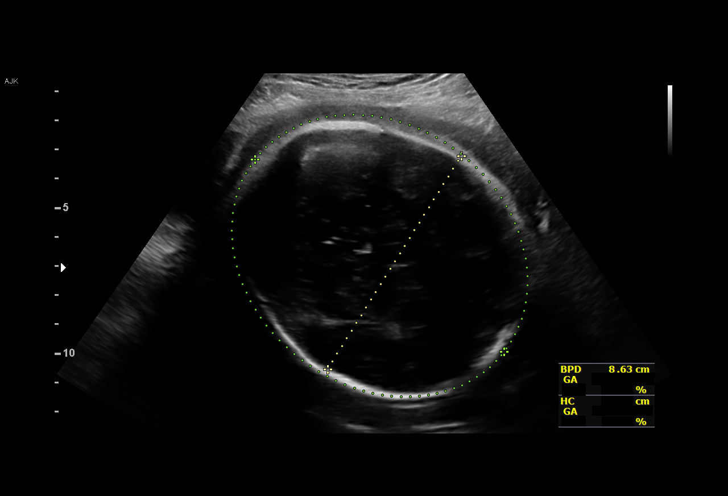
[im 29/53]
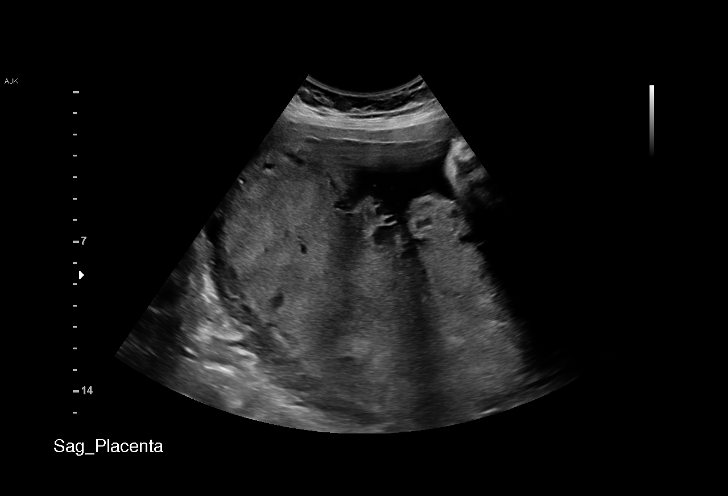
[im 33/53]
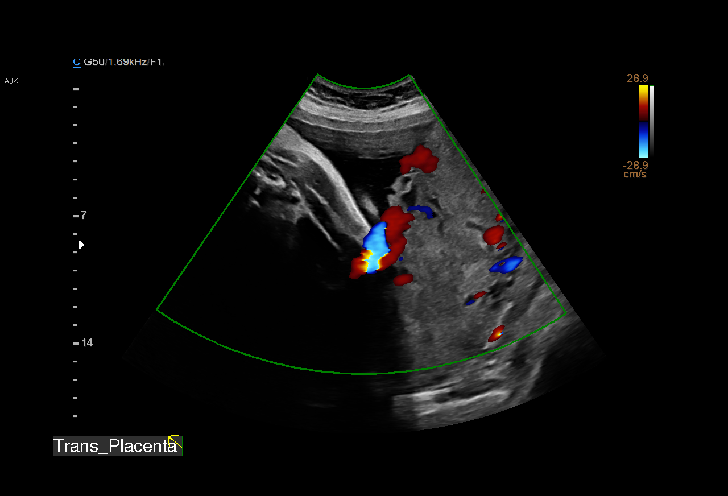
[im 37/53]
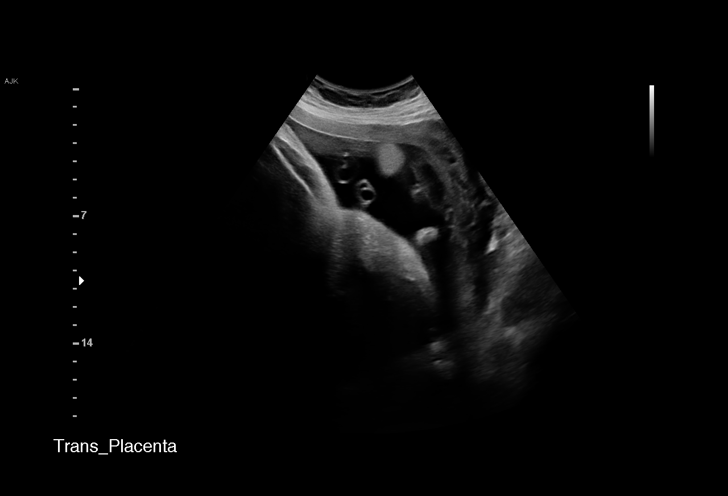
[im 41/53]
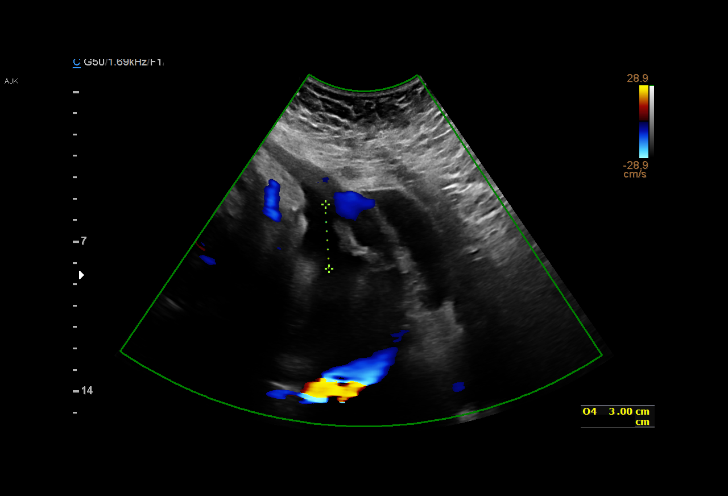
[im 45/53]
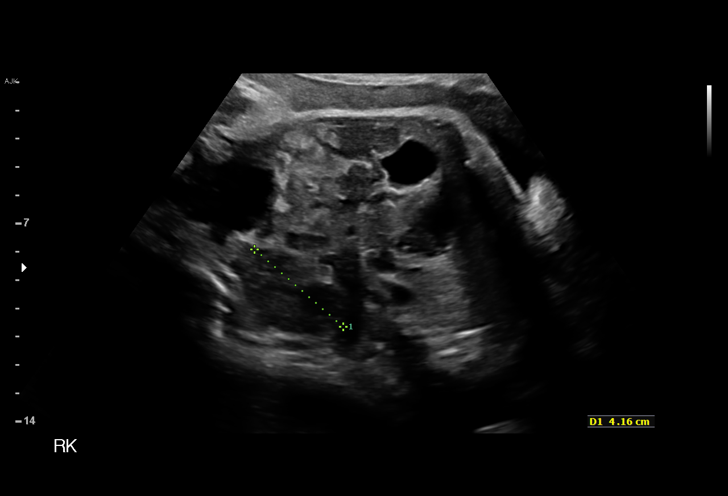
[im 49/53]
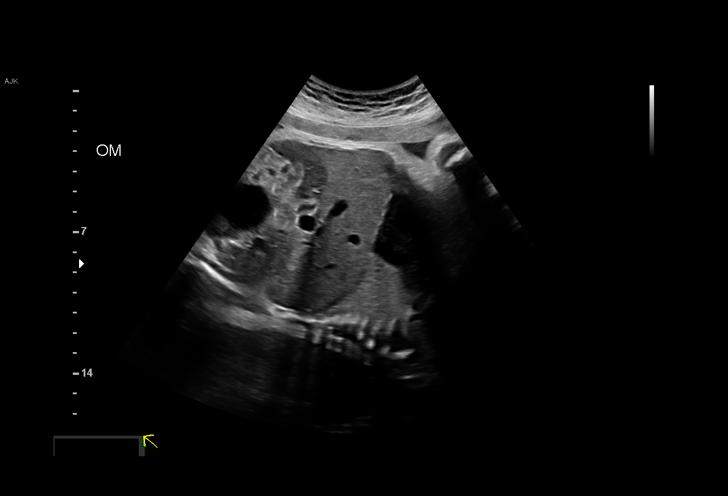
[im 53/53]
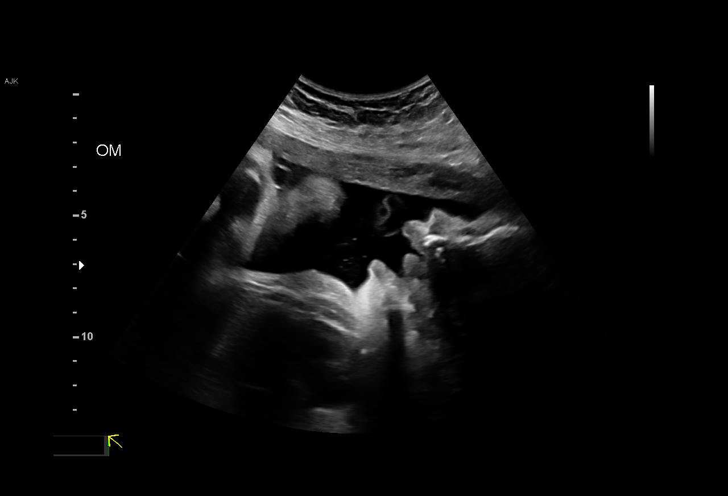

[14 of 28 positions shown; findings below may reference images not displayed]

OB/Gyn Clinic

Indications

Gestational diabetes in pregnancy, insulin
controlled
35 weeks gestation of pregnancy
Vital Signs

Height:        5'6"
Fetal Evaluation

Num Of Fetuses:         1
Fetal Heart Rate(bpm):  138
Cardiac Activity:       Observed
Presentation:           Cephalic
Placenta:               Posterior
P. Cord Insertion:      Visualized

Amniotic Fluid
AFI FV:      Within normal limits

AFI Sum(cm)     %Tile       Largest Pocket(cm)
11.71           33

RUQ(cm)       RLQ(cm)       LUQ(cm)        LLQ(cm)
1.3           3
Biophysical Evaluation

Amniotic F.V:   Within normal limits       F. Tone:        Observed
F. Movement:    Observed                   Score:          [DATE]
F. Breathing:   Observed
Biometry

BPD:        86  mm     G. Age:  34w 5d         43  %    CI:        78.42   %    70 - 86
FL/HC:      20.9   %    20.1 -
HC:      307.2  mm     G. Age:  34w 2d          8  %    HC/AC:      0.85        0.93 -
AC:      361.8  mm     G. Age:  40w 1d       > 97  %    FL/BPD:     74.8   %    71 - 87
FL:       64.3  mm     G. Age:  33w 2d          8  %    FL/AC:      17.8   %    20 - 24

Est. FW:    3790  gm           7 lb   > 90  %
OB History

Gravidity:    5         Term:   3        Prem:   1        SAB:   0
TOP:          0       Ectopic:  0        Living: 4
Gestational Age

LMP:           35w 0d        Date:  10/06/17                 EDD:   07/13/18
U/S Today:     35w 4d                                        EDD:   07/09/18
Best:          35w 0d     Det. By:  LMP  (10/06/17)          EDD:   07/13/18
Anatomy

Cranium:               Appears normal         Aortic Arch:            Previously seen
Cavum:                 Previously seen        Ductal Arch:            Previously seen
Ventricles:            Previously seen        Diaphragm:              Appears normal
Choroid Plexus:        Previously seen        Stomach:                Appears normal, left
sided
Cerebellum:            Previously seen        Abdomen:                Appears normal
Posterior Fossa:       Previously seen        Abdominal Wall:         Previously seen
Nuchal Fold:           Not applicable (>20    Cord Vessels:           Previously seen
wks GA)
Face:                  Orbits and profile     Kidneys:                Appear normal
previously seen
Lips:                  Previously seen        Bladder:                Appears normal
Thoracic:              Appears normal         Spine:                  Previously seen
Heart:                 Previously seen        Upper Extremities:      Previously seen
RVOT:                  Appears normal         Lower Extremities:      Previously seen
LVOT:                  Appears normal

Other:  Fetus appears to be a male. Heels and LT 5th digit previously
visualized.  Technically difficult due to advanced GA and fetal position.
Impression

Gestational diabetes on insulin. History of shoulder dystocia
with no neurological injury in her previous delivery (45 secs).
Amniotic fluid is normal and good fetal activity is seen. The
estimated fetal weight is at greater than the 90th percentile.
Abdominal circumference measures at greater than the 95th
percentile. Antenatal testing is reassuring. BPP [DATE].

Ultrasound has limitations in accurately estimating fetal
weights. Macrosomia in patients with gestational diabetes
increases the risk of shoulder dystocia.
Recommendations

Continue weekly antenatal testing till delivery.

## 2020-03-26 ENCOUNTER — Ambulatory Visit (HOSPITAL_COMMUNITY)
Admission: EM | Admit: 2020-03-26 | Discharge: 2020-03-26 | Disposition: A | Discharge status: Home / Self Care | Payer: Self-pay | Attending: Urgent Care | Admitting: Urgent Care

## 2020-03-26 ENCOUNTER — Other Ambulatory Visit: Payer: Self-pay

## 2020-03-26 DIAGNOSIS — L03213 Periorbital cellulitis: Secondary | ICD-10-CM

## 2020-03-26 DIAGNOSIS — H02845 Edema of left lower eyelid: Secondary | ICD-10-CM

## 2020-03-26 DIAGNOSIS — R519 Headache, unspecified: Secondary | ICD-10-CM

## 2020-03-26 MED ORDER — NAPROXEN 500 MG PO TABS: 500.0000 mg | ORAL_TABLET | Freq: Two times a day (BID) | ORAL | 0 refills | Status: DC

## 2020-03-26 MED ORDER — TETRACAINE HCL 0.5 % OP SOLN
OPHTHALMIC | Status: AC
  Filled 2020-03-26: qty 4

## 2020-03-26 MED ORDER — CEFDINIR 300 MG PO CAPS: 300.0000 mg | ORAL_CAPSULE | Freq: Two times a day (BID) | ORAL | 0 refills | Status: DC

## 2020-03-26 MED ORDER — EYE WASH OPHTH SOLN
OPHTHALMIC | Status: AC
  Filled 2020-03-26: qty 118

## 2020-03-26 MED ORDER — FLUORESCEIN SODIUM 1 MG OP STRP
ORAL_STRIP | OPHTHALMIC | Status: AC
  Filled 2020-03-26: qty 1

## 2020-03-26 NOTE — ED Triage Notes (Signed)
Pt presents to UC for left eye swelling x7 days after using cleaning products and being exposed to dust. Pt has been treating with anti itch drops, with out relief. Pt endorses pain at site that radiates to neck, and head. Pt denies visual disturbances.

## 2020-03-26 NOTE — ED Provider Notes (Signed)
[place]   MRN: [id number] DOB: November 26, 1980  Subjective:   Kara Dominguez is a 39 y.o. female presenting for 7-day history of acute onset left-sided facial pain, lower eyelid swelling.  Pain is radiating from her lower eyelid both laterally and inferiorly toward her neck and also upwards toward her scalp area.  Denies eye trauma, fever, vision change.  Denies eye drainage.  Has noticed white spots in her lower eyelid.  Has prediabetes.  No current facility-administered medications for this encounter.  Current Outpatient Medications:  .  Accu-Chek FastClix Lancets MISC, Use as instructed. Check blood glucose level by fingerstick once per day., Disp: 100 each, Rfl: 12 .  Blood Glucose Monitoring Suppl (ACCU-CHEK NANO SMARTVIEW) w/Device KIT, 1 kit by Subdermal route as directed. Check blood sugars for fasting, and two hours after breakfast, lunch and dinner (4 checks daily), Disp: 1 kit, Rfl: 0 .  glucose blood (ACCU-CHEK SMARTVIEW) test strip, Use as instructed to check blood sugars, Disp: 100 each, Rfl: 12 .  norethindrone (MICRONOR,CAMILA,ERRIN) 0.35 MG tablet, Take 1 tablet (0.35 mg total) by mouth daily. (Patient not taking: Reported on 01/14/2019), Disp: 1 Package, Rfl: 11 .  prenatal vitamin w/FE, FA (PRENATAL 1 + 1) 27-1 MG TABS tablet, Take 1 tablet by mouth daily., Disp: 30 each, Rfl: 0   No Known Allergies  Past Medical History:  Diagnosis Date  . Anemia   . Gestational diabetes   . Preterm labor      Past Surgical History:  Procedure Laterality Date  . CESAREAN SECTION      No family history on file.  Social History   Tobacco Use  . Smoking status: Never Smoker  . Smokeless tobacco: Never Used  Vaping Use  . Vaping Use: Never used  Substance Use Topics  . Alcohol use: No  . Drug use: No    ROS   Objective:   Vitals: BP 132/71 (BP Location: Right Arm)   Pulse 88   Temp 98.2 F (36.8 C) (Oral)   Resp 16   Physical Exam Constitutional:       General: She is not in acute distress.    Appearance: Normal appearance. She is well-developed. She is not ill-appearing, toxic-appearing or diaphoretic.  HENT:     Head: Normocephalic and atraumatic.     Nose: Nose normal.     Mouth/Throat:     Mouth: Mucous membranes are moist.     Pharynx: Oropharynx is clear.  Eyes:     General: Lids are everted, no foreign bodies appreciated. No scleral icterus.       Right eye: No discharge.        Left eye: No foreign body, discharge or hordeolum.     Extraocular Movements: Extraocular movements intact.     Conjunctiva/sclera:     Left eye: Left conjunctiva is injected (slightly). No chemosis, exudate or hemorrhage.    Pupils: Pupils are equal, round, and reactive to light.   Cardiovascular:     Rate and Rhythm: Normal rate.  Pulmonary:     Effort: Pulmonary effort is normal.  Skin:    General: Skin is warm and dry.  Neurological:     General: No focal deficit present.     Mental Status: She is alert and oriented to person, place, and time.  Psychiatric:        Mood and Affect: Mood normal.        Behavior: Behavior normal.  Thought Content: Thought content normal.        Judgment: Judgment normal.      Assessment and Plan :   PDMP not reviewed this encounter.  1. Preseptal cellulitis of left lower eyelid   2. Swelling of left lower eyelid   3. Facial pain     Start cefdinir, pain control with naproxen. Counseled patient on potential for adverse effects with medications prescribed/recommended today, ER and return-to-clinic precautions discussed, patient verbalized understanding.    Jaynee Eagles, [place] 03/26/20 [phone]

## 2020-11-04 ENCOUNTER — Encounter: Payer: Self-pay | Admitting: Family Medicine

## 2020-11-04 ENCOUNTER — Ambulatory Visit (INDEPENDENT_AMBULATORY_CARE_PROVIDER_SITE_OTHER): Payer: Self-pay | Admitting: Family Medicine

## 2020-11-04 ENCOUNTER — Other Ambulatory Visit: Payer: Self-pay

## 2020-11-04 ENCOUNTER — Other Ambulatory Visit: Payer: Self-pay | Admitting: Family Medicine

## 2020-11-04 VITALS — BP 127/86 | HR 85 | Temp 98.0°F | Ht 63.0 in | Wt 207.0 lb

## 2020-11-04 DIAGNOSIS — R350 Frequency of micturition: Secondary | ICD-10-CM

## 2020-11-04 DIAGNOSIS — R6884 Jaw pain: Secondary | ICD-10-CM

## 2020-11-04 LAB — POCT URINALYSIS DIP (MANUAL ENTRY)
Bilirubin, UA: NEGATIVE
Blood, UA: NEGATIVE
Glucose, UA: NEGATIVE mg/dL
Ketones, POC UA: NEGATIVE mg/dL
Leukocytes, UA: NEGATIVE
Nitrite, UA: NEGATIVE
Protein Ur, POC: NEGATIVE mg/dL
Spec Grav, UA: <=1.005 — AB (ref 1.010–1.025)
Urobilinogen, UA: 0.2 E.U./dL
pH, UA: 5.5 (ref 5.0–8.0)

## 2020-11-04 MED ORDER — AZITHROMYCIN 250 MG PO TABS: ORAL_TABLET | ORAL | 0 refills | Status: AC

## 2020-11-04 MED ORDER — AZITHROMYCIN 250 MG PO TABS: ORAL_TABLET | ORAL | 0 refills | Status: DC

## 2020-11-04 MED FILL — AZITHROMYCIN 250 MG TABLET: 250 | 5 days supply | Qty: 6 | Fill #0

## 2020-11-04 NOTE — Patient Instructions (Addendum)
Mantenimiento de Radiographer, therapeutic en las mujeres Health Maintenance, Female Adoptar un estilo de vida saludable y recibir atencin preventiva son importantes para promover la salud y Counsellor. Consulte al mdico sobre:  El esquema adecuado para hacerse pruebas y exmenes peridicos.  Cosas que puede hacer por su cuenta para prevenir enfermedades y [employer]. Qu debo saber sobre la dieta, el peso y el ejercicio? Consuma una dieta saludable  Consuma una dieta que incluya muchas verduras, frutas, productos lcteos con bajo contenido de [place] y Associate Professor.  No consuma muchos alimentos ricos en grasas slidas, azcares agregados o sodio.   Mantenga un peso saludable El ndice de masa muscular [facility]) se [place] para identificar problemas de [place]. Proporciona una estimacin de la grasa corporal basndose en el peso y la altura. Su mdico puede ayudarle a Engineer, site IMC y a Personnel officer o Pharmacologist un peso saludable. Haga ejercicio con regularidad Haga ejercicio con regularidad. Esta es una de las prcticas ms importantes que puede hacer por su salud. La mayora de los adultos deben seguir estas pautas:  Education officer, environmental, al menos, de actividad fsica por semana. El ejercicio debe aumentar la frecuencia cardaca y Media planner transpirar (ejercicio de intensidad moderada).  Hacer ejercicios de fortalecimiento por lo [employer] por semana. Agregue esto a su plan de ejercicio de intensidad moderada.  Pasar menos tiempo sentados. Incluso la actividad fsica ligera puede ser beneficiosa. Controle sus niveles de colesterol y lpidos en la sangre Comience a realizarse anlisis de lpidos y Oncologist en la sangre a los 20aos y luego reptalos cada 5aos. Hgase controlar los niveles de colesterol con mayor frecuencia si:  Sus niveles de lpidos y colesterol son altos.  Es mayor de 40aos.  Presenta un alto riesgo de padecer enfermedades cardacas. Qu debo saber sobre las  pruebas de deteccin del cncer? Segn su historia clnica y sus antecedentes familiares, es posible que deba realizarse pruebas de deteccin del cncer en diferentes edades. Esto puede incluir pruebas de deteccin de lo siguiente:  Cncer de mama.  Cncer de cuello uterino.  Cncer colorrectal.  Cncer de piel.  Cncer de pulmn. Qu debo saber sobre la enfermedad cardaca, la diabetes y la hipertensin arterial? Presin arterial y enfermedad cardaca  La hipertensin arterial causa enfermedades cardacas y [place] el riesgo de accidente cerebrovascular. Es ms probable que esto se manifieste en las personas que tienen lecturas de presin arterial alta, tienen ascendencia africana o tienen sobrepeso.  Hgase controlar la presin arterial: ? Cada 3 a 5 aos si tiene entre 18 y 86 aos. ? Todos los aos si es mayor de [place]. Diabetes Realcese exmenes de deteccin de la diabetes con regularidad. Este anlisis revisa el nivel de azcar en la sangre en [place]. Hgase las pruebas de deteccin:  Cada tresaos despus de los 40aos de edad si tiene un peso normal y un bajo riesgo de padecer diabetes.  Con ms frecuencia y a partir de [place] edad inferior si tiene sobrepeso o un alto riesgo de padecer diabetes. Qu debo saber sobre la prevencin de infecciones? Hepatitis B Si tiene un riesgo ms alto de contraer hepatitis B, debe someterse a un examen de deteccin de este virus. Hable con el mdico para averiguar si tiene riesgo de contraer la infeccin por hepatitis B. Hepatitis C Se recomienda el anlisis a:  [employer] 1945 y 1965.  Todas las personas que tengan un riesgo de haber contrado hepatitis C. Enfermedades de transmisin sexual (ETS)  Hgase las pruebas de deteccin de ITS, incluidas la gonorrea y la clamidia, si: ? Es sexualmente activa y es menor de [place]. ? Es mayor de 24aos, y Public affairs consultant informa que corre riesgo de tener este tipo de  infecciones. ? La actividad sexual ha cambiado desde que le hicieron la ltima prueba de deteccin y tiene un riesgo mayor de Warehouse manager clamidia o Copy. Pregntele al mdico si usted tiene riesgo.  Pregntele al mdico si usted tiene un alto riesgo de Primary school teacher VIH. El mdico tambin puede recomendarle un medicamento recetado para ayudar a evitar la infeccin por el VIH. Si elige tomar medicamentos para prevenir el VIH, primero debe [employer] de deteccin del VIH. Luego debe hacerse anlisis cada mientras est tomando los medicamentos. Embarazo  Si est por dejar de Armed forces training and education officer (fase premenopusica) y usted puede quedar [place], busque asesoramiento antes de [place].  Tome de 400 a (mcg) de cido [employer] si [place].  Pida mtodos de control de la natalidad (anticonceptivos) si desea evitar un embarazo no deseado. Osteoporosis y [place] La osteoporosis es una enfermedad en la que los huesos pierden los minerales y la fuerza por el avance de la edad. El resultado pueden ser fracturas en los [place]. Si tiene 65aos o ms, o si est en riesgo de sufrir osteoporosis y fracturas, pregunte a su mdico si debe:  Hacerse pruebas de deteccin de prdida sea.  Tomar un suplemento de calcio o de vitamina D para reducir el riesgo de fracturas.  Recibir terapia de reemplazo hormonal (TRH) para tratar los sntomas de la menopausia. Siga estas instrucciones en su casa: Estilo de vida  No consuma ningn producto que contenga nicotina o tabaco, como cigarrillos, cigarrillos electrnicos y tabaco de Theatre manager. Si necesita ayuda para dejar de fumar, consulte al mdico.  No consuma drogas.  No comparta agujas.  Solicite ayuda a su mdico si necesita apoyo o informacin para abandonar las drogas. Consumo de alcohol  No beba alcohol si: ? Su mdico le indica no hacerlo. ? Est embarazada, puede estar embarazada o est tratando de quedar  embarazada.  Si bebe alcohol: ? Limite la cantidad que consume de 0 a 1 medida por da. ? Limite la ingesta si est amamantando.  Est atento a la cantidad de alcohol que hay en las bebidas que toma. En los [place], una medida equivale a una botella de cerveza de 12oz ( ), un vaso de vino de 5oz ( ) o un vaso de una bebida alcohlica de alta graduacin de 1oz (64ml). Instrucciones generales  Realcese los estudios de rutina de la salud, dentales y de Wellsite geologist.  Mantngase al da con las vacunas.  Infrmele a su mdico si: ? Se siente deprimida con frecuencia. ? Alguna vez ha sido vctima de [place] o no se siente segura en su casa. Resumen  Adoptar un estilo de vida saludable y recibir atencin preventiva son importantes para promover la salud y Counsellor.  Siga las instrucciones del mdico acerca de una dieta saludable, el ejercicio y la realizacin de pruebas o exmenes para Hotel manager.  Siga las instrucciones del mdico con respecto al control del colesterol y la presin arterial. Esta informacin no tiene Theme park manager el consejo del mdico. Asegrese de hacerle al mdico cualquier pregunta que tenga. Document Revised: 09/19/2018 Document Reviewed: 09/19/2018 Elsevier Patient Education  2021 [employer].   If you have lab work done today you will be contacted with your lab  results within the next 2 weeks.  If you have not heard from [place] then please contact us. The fastest way to get your results is to register for My Chart.   IF you received an x-ray today, you will receive an invoice from [facility]. Please contact [facility] at [phone] with questions or concerns regarding your invoice.   IF you received labwork today, you will receive an invoice from [place]. Please contact [facility] at [phone] with questions or concerns regarding your invoice.   Our billing staff will not be able to assist you with  questions regarding bills from these companies.  You will be contacted with the lab results as soon as they are available. The fastest way to get your results is to activate your My Chart account. Instructions are located on the last page of this paperwork. If you have not heard from [place] regarding the results in 2 weeks, please contact this office.

## 2020-11-04 NOTE — Progress Notes (Signed)
2/23/20223:16 PM  Kara Dominguez [birth year], 40 y.o., female [id number]  Chief Complaint  Patient presents with  . left ear and neck pain     Started Friday.  Throat pain as well.   Marland Kitchen Headache    HPI:   Patient is a 40 y.o. female with past medical history significant for gestational DM, and dermatitis presents today for Left side neck pain  On Friday noticed left ear Saturday unable to sleep since was having ear pain Pain starts back of head and radiates to left ear Left eye tearing Never happened in the past before Denies swelling Has not tried anything for it Pain is worse when moves head Last dental appointment: 2 years Pain is worse when opening and closing jaw Pain and throat worse when eating    Depression screen [facility] 2/9 11/04/2020 01/14/2019 06/15/2018  Decreased Interest 0 0 0  Down, Depressed, Hopeless 0 0 0  PHQ - 2 Score 0 0 0  Altered sleeping - 0 0  Tired, decreased energy - 0 0  Change in appetite - 0 1  Feeling bad or failure about yourself  - 0 0  Trouble concentrating - 0 0  Moving slowly or fidgety/restless - 0 0  Suicidal thoughts - 0 0  PHQ-9 Score - 0 1  Some recent data might be hidden    Fall Risk  11/04/2020 05/11/2018 04/13/2018 03/07/2018 01/03/2017  Falls in the past year? 0 No No No No  Number falls in past yr: 0 - - - -  Injury with Fall? 0 - - - -  Follow up Falls evaluation completed - - - -     No Known Allergies  Prior to Admission medications   Medication Sig Start Date End Date Taking? Authorizing Provider  Accu-Chek FastClix Lancets MISC Use as instructed. Check blood glucose level by fingerstick once per day. 01/14/19   Gildardo Pounds, NP  Blood Glucose Monitoring Suppl (ACCU-CHEK NANO SMARTVIEW) w/Device KIT 1 kit by Subdermal route as directed. Check blood sugars for fasting, and two hours after breakfast, lunch and dinner (4 checks daily) 03/05/18   Truett Mainland, DO  cefdinir (OMNICEF) 300 MG capsule Take 1 capsule  (300 mg total) by mouth 2 (two) times daily. 03/26/20   Jaynee Eagles, PA-C  glucose blood (ACCU-CHEK SMARTVIEW) test strip Use as instructed to check blood sugars 01/14/19   Gildardo Pounds, NP  naproxen (NAPROSYN) 500 MG tablet Take 1 tablet (500 mg total) by mouth 2 (two) times daily with a meal. 03/26/20   Jaynee Eagles, PA-C  norethindrone (MICRONOR,CAMILA,ERRIN) 0.35 MG tablet Take 1 tablet (0.35 mg total) by mouth daily. Patient not taking: Reported on 01/14/2019 08/14/18   Starr Lake, CNM  prenatal vitamin w/FE, FA (PRENATAL 1 + 1) 27-1 MG TABS tablet Take 1 tablet by mouth daily. 12/09/17   Poe, Mallie Snooks, CNM    Past Medical History:  Diagnosis Date  . Anemia   . Gestational diabetes   . Preterm labor     Past Surgical History:  Procedure Laterality Date  . CESAREAN SECTION      Social History   Tobacco Use  . Smoking status: Never Smoker  . Smokeless tobacco: Never Used  Substance Use Topics  . Alcohol use: No    History reviewed. No pertinent family history.  Review of Systems  Constitutional: Negative for chills, fever and malaise/fatigue.  HENT: Positive for ear pain and sore throat. Negative for congestion,  hearing loss, sinus pain and tinnitus.   Eyes: Positive for discharge. Negative for blurred vision, double vision and redness.  Respiratory: Negative for cough, shortness of breath, wheezing and stridor.   Cardiovascular: Negative for chest pain, palpitations and leg swelling.  Gastrointestinal: Negative for abdominal pain, blood in stool, constipation, diarrhea, heartburn, nausea and vomiting.  Genitourinary: Positive for frequency. Negative for dysuria, flank pain, hematuria and urgency.  Musculoskeletal: Positive for neck pain. Negative for back pain and joint pain.  Skin: Negative for rash.  Neurological: Positive for headaches. Negative for dizziness and weakness.     OBJECTIVE:  Today's Vitals   11/04/20 1429  BP: 127/86  Pulse: 85   Temp: 98 F (36.7 C)  Weight: 207 lb (93.9 kg)  Height: '5\' 3"'  (1.6 m)   Body mass index is 36.67 kg/m.   Physical Exam Constitutional:      General: She is not in acute distress.    Appearance: Normal appearance. She is not ill-appearing.  HENT:     Head: Normocephalic.     Jaw: No trismus, tenderness, swelling or pain on movement.     Right Ear: Tympanic membrane, ear canal and external ear normal. There is no impacted cerumen.     Left Ear: Tympanic membrane, ear canal and external ear normal. There is no impacted cerumen.     Mouth/Throat:     Mouth: Mucous membranes are moist.     Pharynx: Oropharynx is clear. No oropharyngeal exudate or posterior oropharyngeal erythema.  Eyes:     Pupils: Pupils are equal, round, and reactive to light.  Cardiovascular:     Rate and Rhythm: Normal rate and regular rhythm.     Pulses: Normal pulses.     Heart sounds: Normal heart sounds. No murmur heard. No friction rub. No gallop.   Pulmonary:     Effort: Pulmonary effort is normal. No respiratory distress.     Breath sounds: Normal breath sounds. No stridor. No wheezing, rhonchi or rales.  Abdominal:     General: Bowel sounds are normal. There is no distension.     Palpations: Abdomen is soft.     Tenderness: There is no abdominal tenderness. There is no right CVA tenderness, left CVA tenderness or guarding.  Musculoskeletal:     Cervical back: Normal range of motion and neck supple. No rigidity or tenderness.     Right lower leg: No edema.     Left lower leg: No edema.  Lymphadenopathy:     Cervical: No cervical adenopathy.  Skin:    General: Skin is warm and dry.  Neurological:     General: No focal deficit present.     Mental Status: She is alert and oriented to person, place, and time. Mental status is at baseline.     Motor: No weakness.     Gait: Gait normal.  Psychiatric:        Mood and Affect: Mood normal.        Behavior: Behavior normal.     Results for orders  placed or performed in visit on 11/04/20 (from the past 24 hour(s))  POCT urinalysis dipstick     Status: Abnormal   Collection Time: 11/04/20  3:06 PM  Result Value Ref Range   Color, UA yellow yellow   Clarity, UA clear clear   Glucose, UA negative negative mg/dL   Bilirubin, UA negative negative   Ketones, POC UA negative negative mg/dL   Spec Grav, UA <=1.005 (A) 1.010 - 1.025  Blood, UA negative negative   pH, UA 5.5 5.0 - 8.0   Protein Ur, POC negative negative mg/dL   Urobilinogen, UA 0.2 0.2 or 1.0 E.U./dL   Nitrite, UA Negative Negative   Leukocytes, UA Negative Negative    No results found.   ASSESSMENT and PLAN  Problem List Items Addressed This Visit   None   Visit Diagnoses    Urinary frequency    -  Primary   Relevant Orders   POCT urinalysis dipstick (Completed)   Jaw pain       Relevant Medications   azithromycin (ZITHROMAX) 250 MG tablet     Plan . Complete antibiotics . RTC/ED precautions provided . Encouraged to follow up with dentist   Return if symptoms worsen or fail to improve.    Huston Foley Nevan Creighton, FNP-BC [facility] [place], [place] [address] Ph.  [phone] Fax [phone]

## 2021-01-04 ENCOUNTER — Other Ambulatory Visit: Payer: Self-pay

## 2021-01-04 ENCOUNTER — Encounter (HOSPITAL_COMMUNITY): Payer: Self-pay

## 2021-01-04 ENCOUNTER — Ambulatory Visit (HOSPITAL_COMMUNITY)
Admission: EM | Admit: 2021-01-04 | Discharge: 2021-01-04 | Disposition: A | Discharge status: Home / Self Care | Payer: Self-pay | Attending: Urgent Care | Admitting: Urgent Care

## 2021-01-04 DIAGNOSIS — R432 Parageusia: Secondary | ICD-10-CM

## 2021-01-04 DIAGNOSIS — J029 Acute pharyngitis, unspecified: Secondary | ICD-10-CM

## 2021-01-04 LAB — POCT INFECTIOUS MONO SCREEN, ED / UC: Mono Screen: NEGATIVE

## 2021-01-04 LAB — POCT RAPID STREP A, ED / UC: Streptococcus, Group A Screen (Direct): NEGATIVE

## 2021-01-04 MED ORDER — FLUTICASONE PROPIONATE 50 MCG/ACT NA SUSP: 2.0000 | Freq: Every day | NASAL | 0 refills | Status: DC

## 2021-01-04 MED ORDER — PSEUDOEPHEDRINE HCL 30 MG PO TABS: 30.0000 mg | ORAL_TABLET | Freq: Three times a day (TID) | ORAL | 0 refills | Status: DC | PRN

## 2021-01-04 MED ORDER — NAPROXEN 375 MG PO TABS: 375.0000 mg | ORAL_TABLET | Freq: Two times a day (BID) | ORAL | 0 refills | Status: DC

## 2021-01-04 MED ORDER — CETIRIZINE HCL 10 MG PO TABS: 10.0000 mg | ORAL_TABLET | Freq: Every day | ORAL | 0 refills | Status: DC

## 2021-01-04 NOTE — ED Provider Notes (Signed)
[facility]   MRN: [id number] DOB: Dec 01, 1980  Subjective:   Kara Dominguez is a 40 y.o. female presenting for 2 to 12-month history of persistent throat pain, swollen lymph nodes about her neck.  Patient states she also has a difficult time when she is outdoors and has a lot of sneezing, runny nose.  She is worried that her work environment is affecting her as she has a bad taste in her mouth when she is at work.  She knows that a lot of abrasive cleaning agents are used including colonics and this irritates her nose and her mouth.  Has not used any medications consistently for her symptoms, she wants to make sure she does not have an infection.  Denies history of asthma, no smoking or alcohol use.  No fevers, chest pain, shortness of breath.  No current facility-administered medications for this encounter. No current outpatient medications on file.   No Known Allergies  Past Medical History:  Diagnosis Date  . Anemia   . Gestational diabetes   . Preterm labor      Past Surgical History:  Procedure Laterality Date  . CESAREAN SECTION      History reviewed. No pertinent family history.  Social History   Tobacco Use  . Smoking status: Never Smoker  . Smokeless tobacco: Never Used  Vaping Use  . Vaping Use: Never used  Substance Use Topics  . Alcohol use: No  . Drug use: No    ROS   Objective:   Vitals: BP 130/82 (BP Location: Left Arm)   Pulse 87   Temp 98.4 F (36.9 C) (Oral)   Resp 20   LMP 12/15/2020 (Exact Date)   SpO2 98%   Physical Exam Constitutional:      General: She is not in acute distress.    Appearance: Normal appearance. She is well-developed. She is not ill-appearing, toxic-appearing or diaphoretic.  HENT:     Head: Normocephalic and atraumatic.     Right Ear: Tympanic membrane and ear canal normal. No drainage or tenderness. No middle ear effusion. Tympanic membrane is not erythematous.     Left Ear: Tympanic membrane  and ear canal normal. No drainage or tenderness.  No middle ear effusion. Tympanic membrane is not erythematous.     Nose: Nose normal. No congestion or rhinorrhea.     Mouth/Throat:     Mouth: Mucous membranes are moist. No oral lesions.     Pharynx: Posterior oropharyngeal erythema present. No pharyngeal swelling, oropharyngeal exudate or uvula swelling.     Tonsils: No tonsillar exudate or tonsillar abscesses.     Comments: Thick streaks of postnasal drainage overlying pharynx. Eyes:     General: No scleral icterus.    Extraocular Movements: Extraocular movements intact.     Right eye: Normal extraocular motion.     Left eye: Normal extraocular motion.     Conjunctiva/sclera: Conjunctivae normal.     Pupils: Pupils are equal, round, and reactive to light.  Cardiovascular:     Rate and Rhythm: Normal rate and regular rhythm.     Pulses: Normal pulses.     Heart sounds: Normal heart sounds. No murmur heard. No friction rub. No gallop.   Pulmonary:     Effort: Pulmonary effort is normal. No respiratory distress.     Breath sounds: Normal breath sounds. No stridor. No wheezing, rhonchi or rales.  Musculoskeletal:     Cervical back: Normal range of motion and neck supple.  Lymphadenopathy:     Cervical: No cervical adenopathy.  Skin:    General: Skin is warm and dry.     Findings: No rash.  Neurological:     General: No focal deficit present.     Mental Status: She is alert and oriented to person, place, and time.  Psychiatric:        Mood and Affect: Mood normal.        Behavior: Behavior normal.        Thought Content: Thought content normal.     Results for orders placed or performed during the hospital encounter of 01/04/21 (from the past 24 hour(s))  POCT Rapid Strep A     Status: None   Collection Time: 01/04/21 12:43 PM  Result Value Ref Range   Streptococcus, Group A Screen (Direct) NEGATIVE NEGATIVE   Mononucleosis screen was negative by verbal  report.  Assessment and Plan :   PDMP not reviewed this encounter.  1. Sore throat   2. Abnormal taste in mouth     Recommended supportive care, starting a regimen to address allergic rhinitis, postnasal drainage is primary source of her symptoms.  We will have her use Zyrtec, Flonase, pseudoephedrine as needed.  Deferred COVID-19 testing given timeline of her symptoms. Counseled patient on potential for adverse effects with medications prescribed/recommended today, ER and return-to-clinic precautions discussed, patient verbalized understanding.    Wallis Bamberg, [place] 01/04/21 1319

## 2021-01-04 NOTE — ED Triage Notes (Signed)
Pt reports having sore throat and metal taste in the mouth since COVID started. States is worse when cleaning with bleach.

## 2021-01-07 LAB — CULTURE, GROUP A STREP (THRC)

## 2021-04-19 ENCOUNTER — Other Ambulatory Visit: Payer: Self-pay

## 2021-04-19 ENCOUNTER — Inpatient Hospital Stay (HOSPITAL_COMMUNITY)
Admission: AD | Admit: 2021-04-19 | Discharge: 2021-04-20 | Disposition: A | Discharge status: Home / Self Care | Payer: Self-pay | Attending: Obstetrics and Gynecology | Admitting: Obstetrics and Gynecology

## 2021-04-19 ENCOUNTER — Inpatient Hospital Stay (HOSPITAL_COMMUNITY): Payer: Self-pay

## 2021-04-19 DIAGNOSIS — M549 Dorsalgia, unspecified: Secondary | ICD-10-CM | POA: Insufficient documentation

## 2021-04-19 DIAGNOSIS — Z789 Other specified health status: Secondary | ICD-10-CM

## 2021-04-19 DIAGNOSIS — O26891 Other specified pregnancy related conditions, first trimester: Secondary | ICD-10-CM | POA: Insufficient documentation

## 2021-04-19 DIAGNOSIS — O418X10 Other specified disorders of amniotic fluid and membranes, first trimester, not applicable or unspecified: Secondary | ICD-10-CM

## 2021-04-19 DIAGNOSIS — O209 Hemorrhage in early pregnancy, unspecified: Secondary | ICD-10-CM

## 2021-04-19 DIAGNOSIS — O200 Threatened abortion: Secondary | ICD-10-CM

## 2021-04-19 DIAGNOSIS — O09521 Supervision of elderly multigravida, first trimester: Secondary | ICD-10-CM | POA: Insufficient documentation

## 2021-04-19 DIAGNOSIS — Z603 Acculturation difficulty: Secondary | ICD-10-CM

## 2021-04-19 DIAGNOSIS — Z3A01 Less than 8 weeks gestation of pregnancy: Secondary | ICD-10-CM

## 2021-04-19 LAB — WET PREP, GENITAL
Clue Cells Wet Prep HPF POC: NONE SEEN
Sperm: NONE SEEN
Trich, Wet Prep: NONE SEEN
Yeast Wet Prep HPF POC: NONE SEEN

## 2021-04-19 LAB — CBC
HCT: 37.1 % (ref 36.0–46.0)
Hemoglobin: 12.2 g/dL (ref 12.0–15.0)
MCH: 26.9 pg (ref 26.0–34.0)
MCHC: 32.9 g/dL (ref 30.0–36.0)
MCV: 81.7 fL (ref 80.0–100.0)
Platelets: 252 10*3/uL (ref 150–400)
RBC: 4.54 MIL/uL (ref 3.87–5.11)
RDW: 13.5 % (ref 11.5–15.5)
WBC: 10.2 10*3/uL (ref 4.0–10.5)
nRBC: 0 % (ref 0.0–0.2)

## 2021-04-19 LAB — POCT PREGNANCY, URINE: Preg Test, Ur: POSITIVE — AB

## 2021-04-19 NOTE — MAU Note (Signed)
SAYS WITH MARTA- INTERPRETER- HPT- POSITIVE - IN July.  VAG BLEEDING STARTED SAT WHEN SHE WIPES THIS AM BLOOD IN TOILET, THEN AT 130PM- PASSED CLOTS-LONG STRINGY PAIN- ABD AND BACK- APPOINTMENT TOMORROW- HD

## 2021-04-20 ENCOUNTER — Encounter (HOSPITAL_COMMUNITY): Payer: Self-pay | Admitting: Family Medicine

## 2021-04-20 DIAGNOSIS — O209 Hemorrhage in early pregnancy, unspecified: Secondary | ICD-10-CM

## 2021-04-20 DIAGNOSIS — Z3A01 Less than 8 weeks gestation of pregnancy: Secondary | ICD-10-CM

## 2021-04-20 DIAGNOSIS — O200 Threatened abortion: Secondary | ICD-10-CM

## 2021-04-20 LAB — GC/CHLAMYDIA PROBE AMP (~~LOC~~) NOT AT ARMC
Chlamydia: NEGATIVE
Comment: NEGATIVE
Comment: NORMAL
Neisseria Gonorrhea: NEGATIVE

## 2021-04-20 LAB — HIV ANTIBODY (ROUTINE TESTING W REFLEX): HIV Screen 4th Generation wRfx: NONREACTIVE

## 2021-04-20 LAB — HCG, QUANTITATIVE, PREGNANCY: hCG, Beta Chain, Quant, S: 12372 m[IU]/mL — ABNORMAL HIGH (ref <5)

## 2021-04-20 NOTE — Discharge Instructions (Signed)
Regreso a MAU:  Si tiene un sangrado ms abundante que empapa ms de 2 toallas sanitarias por hora durante una hora o ms  Si sangra tanto que siente que se va a Artist o se desmaya  Si tiene dolor abdominal significativo que no mejora con Tylenol 1000 mg cada 6 horas segn sea necesario para el dolor  Si desarrolla fiebre > 100.5

## 2021-04-20 NOTE — MAU Provider Note (Signed)
History     CSN: [id number]  Arrival date and time: 04/19/21 2158   Event Date/Time   First Provider Initiated Contact with Patient 04/20/21 0007      Chief Complaint  Patient presents with   Abdominal Pain   Vaginal Bleeding   [name] is a 40 y.o. year old [id number] female at Unknown weeks gestation who presents to MAU reporting (+) HPT in July, VB that started 04/17/21 with wiping. She noticed blood in the toilet this AM. She started passing long, stringy blood clots @ 1330. She also complains of abdominal and back pain. She has not started [facility], but is scheduled to be seen at the [facility] today (04/20/21). She verbalized that she "tried to call the clinic for women at the [facility], but she couldn't get anyone there. I really liked the care they gave me there and want to go back."   OB History     Gravida  6   Para  5   Term  4   Preterm  1   AB      Living  5      SAB      IAB      Ectopic      Multiple  0   Live Births  5           Past Medical History:  Diagnosis Date   Anemia    Gestational diabetes    Preterm labor     Past Surgical History:  Procedure Laterality Date   CESAREAN SECTION      History reviewed. No pertinent family history.  Social History   Tobacco Use   Smoking status: Never   Smokeless tobacco: Never  Vaping Use   Vaping Use: Never used  Substance Use Topics   Alcohol use: No   Drug use: No    Allergies: No Known Allergies  Medications Prior to Admission  Medication Sig Dispense Refill Last Dose   cetirizine (ZYRTEC ALLERGY) 10 MG tablet Take 1 tablet (10 mg total) by mouth daily. 30 tablet 0    fluticasone (FLONASE) 50 MCG/ACT nasal spray Place 2 sprays into both nostrils daily. 16 g 0    naproxen (NAPROSYN) 375 MG tablet Take 1 tablet (375 mg total) by mouth 2 (two) times daily with a meal. 30 tablet 0    pseudoephedrine (SUDAFED) 30 MG tablet Take 1 tablet (30 mg total) by mouth every 8 (eight)  hours as needed for congestion. 30 tablet 0     Review of Systems  Constitutional: Negative.   HENT: Negative.    Eyes: Negative.   Respiratory: Negative.    Cardiovascular: Negative.   Gastrointestinal: Negative.   Endocrine: Negative.   Genitourinary:  Positive for pelvic pain and vaginal bleeding.  Musculoskeletal:  Positive for back pain.  Skin: Negative.   Allergic/Immunologic: Negative.   Neurological: Negative.   Hematological: Negative.   Psychiatric/Behavioral: Negative.    Physical Exam   Blood pressure 138/86, pulse 91, temperature 98.7 F (37.1 C), temperature source Oral, resp. rate 20, height 5\' 3"  (1.6 m), weight 95.6 kg, last menstrual period 02/05/2021, unknown if currently breastfeeding.  Physical Exam Vitals and nursing note reviewed. Exam conducted with a chaperone present.  Constitutional:      Appearance: Normal appearance. She is obese.  Cardiovascular:     Rate and Rhythm: Normal rate.     Pulses: Normal pulses.  Pulmonary:     Effort: Pulmonary effort is normal.  Abdominal:     Palpations: Abdomen is soft.  Genitourinary:    General: Normal vulva.     Comments: Pelvic exam: External genitalia normal, SE: vaginal walls pink and well rugated, cervix is smooth, pink, no lesions, small amt of mucoid, dark, red vaginal d/c -- WP, GC/CT done by self-swab by patient, cervix closed/thick/firm, Uterus is non-tender, no CMT or friability, no adnexal tenderness.  Musculoskeletal:        General: Normal range of motion.  Skin:    General: Skin is warm and dry.  Neurological:     Mental Status: She is alert and oriented to person, place, and time.  Psychiatric:        Mood and Affect: Mood normal.        Behavior: Behavior normal.        Thought Content: Thought content normal.        Judgment: Judgment normal.   MAU Course  Procedures  MDM CCUA UPT CBC ABO/Rh HCG Wet Prep GC/CT -- pending HIV -- pending OB < 14 wks [place] with TV  Results for  orders placed or performed during the hospital encounter of 04/19/21 (from the past 24 hour(s))  Pregnancy, urine POC     Status: Abnormal   Collection Time: 04/19/21 10:53 PM  Result Value Ref Range   Preg Test, Ur POSITIVE (A) NEGATIVE  Wet prep, genital     Status: Abnormal   Collection Time: 04/19/21 10:58 PM   Specimen: PATH Cytology Cervicovaginal Ancillary Only  Result Value Ref Range   Yeast Wet Prep HPF POC NONE SEEN NONE SEEN   Trich, Wet Prep NONE SEEN NONE SEEN   Clue Cells Wet Prep HPF POC NONE SEEN NONE SEEN   WBC, Wet Prep HPF POC MODERATE (A) NONE SEEN   Sperm NONE SEEN   CBC     Status: None   Collection Time: 04/19/21 11:04 PM  Result Value Ref Range   WBC 10.2 4.0 - 10.5 K/uL   RBC 4.54 3.87 - 5.11 MIL/uL   Hemoglobin 12.2 12.0 - 15.0 g/dL   HCT 24.5 80.9 - 98.3 %   MCV 81.7 80.0 - 100.0 fL   MCH 26.9 26.0 - 34.0 pg   MCHC 32.9 30.0 - 36.0 g/dL   RDW 38.2 50.5 - 39.7 %   Platelets 252 150 - 400 K/uL   nRBC 0.0 0.0 - 0.2 %  hCG, quantitative, pregnancy     Status: Abnormal   Collection Time: 04/19/21 11:04 PM  Result Value Ref Range   hCG, Beta Chain, Quant, S 12,372 (H) <5 mIU/mL    US OB LESS THAN 14 WEEKS WITH OB TRANSVAGINAL  Result Date: 04/19/2021 CLINICAL DATA:  Vaginal bleeding with passage of clots since Saturday EXAM: OBSTETRIC <14 WK [place] AND TRANSVAGINAL OB US TECHNIQUE: Both transabdominal and transvaginal ultrasound examinations were performed for complete evaluation of the gestation as well as the maternal uterus, adnexal regions, and pelvic cul-de-sac. Transvaginal technique was performed to assess early pregnancy. COMPARISON:  Obstetrical ultrasound from prior gestation 06/28/2018 FINDINGS: Intrauterine gestational sac: Single Yolk sac:  Visualized. Embryo:  Visualized. Cardiac Activity: Visualized. Heart Rate: While cardiac activity is noted on the cinematic clips M-mode is unable to capture an accurate heart rate measurement for the fetal pole.  CRL:  3 mm   5 w   6 d                  [place] EDC: 12/14/2021 Subchorionic hemorrhage: Small  volume subchorionic hemorrhage (18/54) Maternal uterus/adnexae: Otherwise unremarkable appearance of anteverted maternal uterus. No concerning adnexal lesions. Small amount of anechoic fluid seen in the posterior cul-de-sac. IMPRESSION: 1. Single intrauterine gestation at an estimated gestational age of [redacted] weeks, 6 days by crown-rump length sonographic estimation. 2. Visible cardiac activity though unable to accurately ascertain a fetal heart rate largely due to the diminutive size of the fetal pole. Could consider follow-up sonography in 10-14 days to ensure continued viability. 3. Small volume subchorionic hemorrhage. 4. Small volume anechoic free fluid in the posterior cul-de-sac, nonspecific though can be physiologic. Electronically Signed   By: Kreg Shropshire M.D.   On: 04/19/2021 23:38     Assessment and Plan  Subchorionic hematoma in first trimester, single or unspecified fetus  - Information provided on [facility] - Return to MAU: If you have heavier bleeding that soaks through more that 2 pads per hour for an hour or more If you bleed so much that you feel like you might pass out or you do pass out If you have significant abdominal pain that is not improved with Tylenol 1000 mg every 6 hours as needed for pain If you develop a fever > 100.5   Threatened miscarriage in early pregnancy - Information provided on threatened miscarriage   [redacted] weeks gestation of pregnancy  Language barrier - [employer] Video Spanish Interpreter, Kathie Rhodes [phone] used for entire exam, explanation of results and discharge instructions   - Discharge patient - Keep scheduled appt with [facility] - Advised to notify [facility] that she would like to transfer to [facility] - Message sent to [facility] to get patient scheduled - Patient verbalized an understanding of the plan of care and agrees.   Raelyn Mora, CNM 04/20/2021, 12:07 AM

## 2021-04-22 ENCOUNTER — Inpatient Hospital Stay (HOSPITAL_COMMUNITY): Payer: Self-pay

## 2021-04-22 ENCOUNTER — Other Ambulatory Visit: Payer: Self-pay

## 2021-04-22 ENCOUNTER — Inpatient Hospital Stay (HOSPITAL_COMMUNITY)
Admission: AD | Admit: 2021-04-22 | Discharge: 2021-04-22 | Disposition: A | Discharge status: Home / Self Care | Payer: Self-pay | Attending: Obstetrics and Gynecology | Admitting: Obstetrics and Gynecology

## 2021-04-22 ENCOUNTER — Encounter (HOSPITAL_COMMUNITY): Payer: Self-pay | Admitting: Obstetrics and Gynecology

## 2021-04-22 DIAGNOSIS — O039 Complete or unspecified spontaneous abortion without complication: Secondary | ICD-10-CM

## 2021-04-22 DIAGNOSIS — R58 Hemorrhage, not elsewhere classified: Secondary | ICD-10-CM

## 2021-04-22 DIAGNOSIS — O034 Incomplete spontaneous abortion without complication: Secondary | ICD-10-CM | POA: Insufficient documentation

## 2021-04-22 LAB — CBC
HCT: 36.2 % (ref 36.0–46.0)
Hemoglobin: 11.9 g/dL — ABNORMAL LOW (ref 12.0–15.0)
MCH: 27 pg (ref 26.0–34.0)
MCHC: 32.9 g/dL (ref 30.0–36.0)
MCV: 82.1 fL (ref 80.0–100.0)
Platelets: 289 10*3/uL (ref 150–400)
RBC: 4.41 MIL/uL (ref 3.87–5.11)
RDW: 13.5 % (ref 11.5–15.5)
WBC: 12.7 10*3/uL — ABNORMAL HIGH (ref 4.0–10.5)
nRBC: 0 % (ref 0.0–0.2)

## 2021-04-22 MED ORDER — KETOROLAC TROMETHAMINE 30 MG/ML IJ SOLN: 30.0000 mg | Freq: Once | INTRAMUSCULAR | Status: AC

## 2021-04-22 MED ORDER — LACTATED RINGERS IV BOLUS
1000.0000 mL | Freq: Once | INTRAVENOUS | Status: AC
  Administered 2021-04-22: 1000 mL via INTRAVENOUS

## 2021-04-22 MED ORDER — KETOROLAC TROMETHAMINE 30 MG/ML IJ SOLN
INTRAMUSCULAR | Status: AC
  Administered 2021-04-22: 30 mg via INTRAVENOUS
  Filled 2021-04-22: qty 1

## 2021-04-22 MED ORDER — IBUPROFEN 600 MG PO TABS: 600.0000 mg | ORAL_TABLET | Freq: Four times a day (QID) | ORAL | 1 refills | Status: DC | PRN

## 2021-04-22 MED ORDER — TRAMADOL HCL 50 MG PO TABS: 50.0000 mg | ORAL_TABLET | Freq: Four times a day (QID) | ORAL | 0 refills | Status: DC | PRN

## 2021-04-22 MED ORDER — METHYLERGONOVINE MALEATE 0.2 MG PO TABS: 0.2000 mg | ORAL_TABLET | Freq: Three times a day (TID) | ORAL | 0 refills | Status: DC

## 2021-04-22 NOTE — MAU Provider Note (Signed)
Chief Complaint:  Vaginal Bleeding    HPI: Kara Dominguez is a 40 y.o. [phone] who presents to maternity admissions reporting heavy vaginal bleeding and cramping at home.  Showed picture of blood clots with clear gestational sac.  Was seen here 04/20/21 and US showed live IUP at [redacted]w[redacted]d Patient fainted once we got her in room.   Level 5 caveat for ROS   Husband provided info  Vaginal Bleeding The patient's primary symptoms include pelvic pain and vaginal bleeding. The patient's pertinent negatives include no genital itching, genital lesions or genital odor. This is a new problem. The current episode started today. The problem has been unchanged. The pain is moderate. She is pregnant. Associated symptoms include abdominal pain. Pertinent negatives include no constipation, diarrhea, fever or headaches. The vaginal discharge was bloody. The vaginal bleeding is heavier than menses. She has been passing clots. She has been passing tissue. Nothing aggravates the symptoms. She has tried nothing for the symptoms.  RN Note: Patient was rushed back into room due to registration calling she needed help. Patient was slouched over wheelchair and non-responsive. Provider called to bedside, patient responded and was assisted into bed. Reports she has been bleeding for two hours and passed a lot of clots, one of them looked like tissue.Reports she is having mild cramping.   Past Medical History: Past Medical History:  Diagnosis Date   Anemia    Gestational diabetes    Preterm labor     Past obstetric history: OB History  Gravida Para Term Preterm AB Living  6 5 4 1   5   SAB IAB Ectopic Multiple Live Births        0 5    # Outcome Date GA Lbr Len/2nd Weight Sex Delivery Anes PTL Lv  6 Current           5 Term 06/29/18 [redacted]w[redacted]d 04:31 / 00:12 3515 g M VBAC None  LIV  4 Term 10/26/14 [redacted]w[redacted]d 05:50 / 00:39 4150 g M Vag-Spont None  LIV  3 Preterm 05/29/09 [redacted]w[redacted]d  1899 g M Vag-Spont None Y LIV  2 Term 03/01/00  [redacted]w[redacted]d  3629 g F CS-LTranv EPI N LIV  1 Term 12/15/96 [redacted]w[redacted]d  3175 g M Vag-Spont None N LIV    Past Surgical History: Past Surgical History:  Procedure Laterality Date   CESAREAN SECTION      Family History: No family history on file.  Social History: Social History   Tobacco Use   Smoking status: Never   Smokeless tobacco: Never  Vaping Use   Vaping Use: Never used  Substance Use Topics   Alcohol use: No   Drug use: No    Allergies: No Known Allergies  Meds:  Medications Prior to Admission  Medication Sig Dispense Refill Last Dose   cetirizine (ZYRTEC ALLERGY) 10 MG tablet Take 1 tablet (10 mg total) by mouth daily. 30 tablet 0    fluticasone (FLONASE) 50 MCG/ACT nasal spray Place 2 sprays into both nostrils daily. 16 g 0    pseudoephedrine (SUDAFED) 30 MG tablet Take 1 tablet (30 mg total) by mouth every 8 (eight) hours as needed for congestion. 30 tablet 0     I have reviewed patient's Past Medical Hx, Surgical Hx, Family Hx, Social Hx, medications and allergies.  ROS:  Review of Systems  Constitutional:  Negative for fever.  Gastrointestinal:  Positive for abdominal pain. Negative for constipation and diarrhea.  Genitourinary:  Positive for pelvic pain and vaginal bleeding.  Neurological:  Negative for headaches.  Other systems negative     Physical Exam  No data found. Constitutional: Well-developed, well-nourished female in no acute distress, but pale and limited responsiveness at first.  Aroused with stimulation.  Cardiovascular: normal rate and rhythm Respiratory: normal effort, no distress. GI: Abd soft, non-tender.  Nondistended.  No rebound, No guarding.   MS: Extremities nontender, no edema, normal ROM Neurologic: Alert and oriented x 4.   Grossly nonfocal. GU: Neg CVAT. Skin:  Warm and Dry  PELVIC EXAM: Copious clotted blood in vault.   Removed about clot from vagina and cervix.  Cervix 1cm dilated with clot inside canal.    Labs: Results  for orders placed or performed during the hospital encounter of 04/22/21 (from the past 24 hour(s))  CBC     Status: Abnormal   Collection Time: 04/22/21  1:15 AM  Result Value Ref Range   WBC 12.7 (H) 4.0 - 10.5 K/uL   RBC 4.41 3.87 - 5.11 MIL/uL   Hemoglobin 11.9 (L) 12.0 - 15.0 g/dL   HCT 61.6 07.3 - 71.0 %   MCV 82.1 80.0 - 100.0 fL   MCH 27.0 26.0 - 34.0 pg   MCHC 32.9 30.0 - 36.0 g/dL   RDW 62.6 94.8 - 54.6 %   Platelets 289 150 - 400 K/uL   nRBC 0.0 0.0 - 0.2 %    Ref. Range 04/19/2021 23:04  Hemoglobin Latest Ref Range: 12.0 - 15.0 g/dL 27.0      Imaging:  US OB Transvaginal  Result Date: 04/22/2021 CLINICAL DATA:  Spontaneous abortion, rule out RPOC, heavy vaginal bleeding EXAM: TRANSVAGINAL OB ULTRASOUND TECHNIQUE: Transvaginal ultrasound was performed for complete evaluation of the gestation as well as the maternal uterus, adnexal regions, and pelvic cul-de-sac. COMPARISON:  Ultrasound 04/19/2021 FINDINGS: Intrauterine gestational sac: None Yolk sac:  Not Visualized. Embryo:  Not Visualized. Cardiac Activity: Not Visualized. Maternal uterus/adnexae: Anteverted maternal uterus. Persistent heterogeneous thickening of the endometrium measuring up to 35 mm in double stripe endometrial thickness. No focal mass or increased vascularity is seen. Normal appearance of the ovaries. Small volume of anechoic free fluid is seen in the deep pelvis. IMPRESSION: No intrauterine gestational sac is identified compatible with reported spontaneous abortion. Persistent heterogeneous thickening of the endometrium measuring up to 35 mm in double stripe thickness without focal mass or hypervascularity. May reflect endometrial blood products and such an appearance is fairly common particularly in the first several days postpartum however becomes increasingly likely reflect retained products of conception if persisting. However, hypovascular retained products of conception cannot be fully excluded. Consider  short-term follow-up pelvic ultrasound or further evaluation with pelvic MRI without and with IV contrast as clinically warranted. Small volume of anechoic free fluid in the deep pelvis is nonspecific though can be a physiologic finding. Electronically Signed   By: Kreg Shropshire M.D.   On: 04/22/2021 03:06    MAU Course/MDM: I have ordered labs as follows:  CBC, Type/screen hold Imaging ordered: [place] which showed thickened endometrium, hypovascular Results reviewed.   Consult Dr Donavan Foil who recommends 24 hrs of Methergine with close followup in office. . Message sent to office to arrange visit next week Treatments in MAU included IV hydration, analgesia.  Color is better, pt states she feels much better Discussed results with patient using translator .  Plan Methergine to help uterus evacuate clots.  Analgesia ordered for home use. Pt stable at time of discharge.  Assessment: 1. Hemorrhage   2.  SAB (spontaneous abortion)     Plan: Discharge home Recommend bleeding precautions Rx sent for Methergine for evacuation of clots and Tramadol/ibuprofen for pain Message sent to office for followup  Encouraged to return here or to other Urgent Care/ED if she develops worsening of symptoms, increase in pain, fever, or other concerning symptoms.   Wynelle Bourgeois CNM, MSN Certified Nurse-Midwife 04/22/2021 2:39 AM

## 2021-04-22 NOTE — MAU Note (Signed)
Patient was rushed back into room due to registration calling she needed help. Patient was slouched over wheelchair and non-responsive. Provider called to bedside, patient responded and was assisted into bed. Reports she has been bleeding for two hours and passed a lot of clots, one of them looked like tissue.Reports she is having mild cramping.

## 2021-05-18 ENCOUNTER — Other Ambulatory Visit: Payer: Self-pay

## 2021-05-18 ENCOUNTER — Ambulatory Visit (INDEPENDENT_AMBULATORY_CARE_PROVIDER_SITE_OTHER): Payer: Self-pay | Admitting: Family Medicine

## 2021-05-18 ENCOUNTER — Encounter: Payer: Self-pay | Admitting: Family Medicine

## 2021-05-18 VITALS — BP 136/90 | HR 89 | Ht 65.0 in | Wt 208.2 lb

## 2021-05-18 DIAGNOSIS — O039 Complete or unspecified spontaneous abortion without complication: Secondary | ICD-10-CM

## 2021-05-18 DIAGNOSIS — Z30011 Encounter for initial prescription of contraceptive pills: Secondary | ICD-10-CM

## 2021-05-18 DIAGNOSIS — Z789 Other specified health status: Secondary | ICD-10-CM

## 2021-05-18 DIAGNOSIS — O209 Hemorrhage in early pregnancy, unspecified: Secondary | ICD-10-CM

## 2021-05-18 MED ORDER — PNV FOLIC ACID + IRON 27-1 MG PO TABS: 1.0000 | ORAL_TABLET | Freq: Every day | ORAL | 6 refills | Status: DC

## 2021-05-18 MED ORDER — THERA VITAL M PO TABS: 1.0000 | ORAL_TABLET | Freq: Every day | ORAL | 0 refills | Status: DC

## 2021-05-18 MED ORDER — NORETHINDRONE 0.35 MG PO TABS: 1.0000 | ORAL_TABLET | Freq: Every day | ORAL | 11 refills | Status: DC

## 2021-05-18 NOTE — Progress Notes (Signed)
Pt states Bleeding stopped on 8/23.

## 2021-05-18 NOTE — Progress Notes (Signed)
GYNECOLOGY OFFICE VISIT NOTE  History:  40 y.o. [id number] here today for SAB f/up after SAB on 8/11. She had US findings concerning for blood in uterus and was given Methergine prescription for 24 hours after.   At this time she has no pain. Bleeding has stopped since 8/23. Sometimes feeling lightheaded but no episodes of presyncope or syncope.   At this time she denies any abnormal vaginal discharge, pelvic pain or other concerns.    The following portions of the patient's history were reviewed and updated as appropriate: allergies, current medications, past family history, past medical history, past social history, past surgical history and problem list.   Health Maintenance:  Normal pap and negative HRHPV on 02/01/2018.    Review of Systems:  Pertinent items noted in HPI Review of Systems  Constitutional:  Negative for chills, diaphoresis and fever.  Cardiovascular:  Negative for chest pain and palpitations.  Genitourinary:  Negative for dysuria, frequency, hematuria and urgency.  Neurological:  Positive for dizziness. Negative for tingling, tremors and headaches.  All other systems reviewed and are negative.  Objective:  Physical Exam BP 136/90   Pulse 89   Ht 5\' 5"  (1.651 m)   Wt 208 lb 3.2 oz (94.4 kg)   LMP 02/05/2021 Comment: SAB  Breastfeeding Unknown   BMI 34.65 kg/m  Physical Exam Constitutional:      Appearance: Normal appearance.  HENT:     Head: Normocephalic.  Cardiovascular:     Rate and Rhythm: Normal rate and regular rhythm.  Pulmonary:     Effort: Pulmonary effort is normal.  Abdominal:     General: Abdomen is flat.  Skin:    General: Skin is warm.  Neurological:     General: No focal deficit present.     Mental Status: She is alert and oriented to person, place, and time.  Psychiatric:        Mood and Affect: Mood normal.    Labs and Imaging No results found for this or any previous visit (from the past 168 hour(s)). 02/07/2021 OB  Transvaginal  Result Date: 04/22/2021 CLINICAL DATA:  Spontaneous abortion, rule out RPOC, heavy vaginal bleeding EXAM: TRANSVAGINAL OB ULTRASOUND TECHNIQUE: Transvaginal ultrasound was performed for complete evaluation of the gestation as well as the maternal uterus, adnexal regions, and pelvic cul-de-sac. COMPARISON:  Ultrasound 04/19/2021 FINDINGS: Intrauterine gestational sac: None Yolk sac:  Not Visualized. Embryo:  Not Visualized. Cardiac Activity: Not Visualized. Maternal uterus/adnexae: Anteverted maternal uterus. Persistent heterogeneous thickening of the endometrium measuring up to 35 mm in double stripe endometrial thickness. No focal mass or increased vascularity is seen. Normal appearance of the ovaries. Small volume of anechoic free fluid is seen in the deep pelvis. IMPRESSION: No intrauterine gestational sac is identified compatible with reported spontaneous abortion. Persistent heterogeneous thickening of the endometrium measuring up to 35 mm in double stripe thickness without focal mass or hypervascularity. May reflect endometrial blood products and such an appearance is fairly common particularly in the first several days postpartum however becomes increasingly likely reflect retained products of conception if persisting. However, hypovascular retained products of conception cannot be fully excluded. Consider short-term follow-up pelvic ultrasound or further evaluation with pelvic MRI without and with IV contrast as clinically warranted. Small volume of anechoic free fluid in the deep pelvis is nonspecific though can be a physiologic finding. Electronically Signed   By: 06/19/2021 M.D.   On: 04/22/2021 03:06   06/22/2021 OB LESS THAN 14 WEEKS WITH OB TRANSVAGINAL  Result Date: 04/19/2021 CLINICAL DATA:  Vaginal bleeding with passage of clots since Saturday EXAM: OBSTETRIC <14 WK [place] AND TRANSVAGINAL OB US TECHNIQUE: Both transabdominal and transvaginal ultrasound examinations were performed for  complete evaluation of the gestation as well as the maternal uterus, adnexal regions, and pelvic cul-de-sac. Transvaginal technique was performed to assess early pregnancy. COMPARISON:  Obstetrical ultrasound from prior gestation 06/28/2018 FINDINGS: Intrauterine gestational sac: Single Yolk sac:  Visualized. Embryo:  Visualized. Cardiac Activity: Visualized. Heart Rate: While cardiac activity is noted on the cinematic clips M-mode is unable to capture an accurate heart rate measurement for the fetal pole. CRL:  3 mm   5 w   6 d                  [place] EDC: 12/14/2021 Subchorionic hemorrhage: Small volume subchorionic hemorrhage (18/54) Maternal uterus/adnexae: Otherwise unremarkable appearance of anteverted maternal uterus. No concerning adnexal lesions. Small amount of anechoic fluid seen in the posterior cul-de-sac. IMPRESSION: 1. Single intrauterine gestation at an estimated gestational age of [redacted] weeks, 6 days by crown-rump length sonographic estimation. 2. Visible cardiac activity though unable to accurately ascertain a fetal heart rate largely due to the diminutive size of the fetal pole. Could consider follow-up sonography in 10-14 days to ensure continued viability. 3. Small volume subchorionic hemorrhage. 4. Small volume anechoic free fluid in the posterior cul-de-sac, nonspecific though can be physiologic. Electronically Signed   By: Kreg Shropshire M.D.   On: 04/19/2021 23:38    Assessment & Plan:  1. SAB (spontaneous abortion) Patient had SAB on 8/11 with heavy bleeding and blood in uterus on [place]. Has now stopped having bleeding and cramping. Does report some lightheadedness at times. No episodes of syncope or presyncope - CBC - HCG - Return and hospital precautions discussed in detail  2. Language barrier In person Spanish interpreter present for visit  3. Contraceptive counseling Would like to wait a few months before getting pregnant again. Would like to restart POPs (she has been on them before).  Discussed other methods of hormonal contraception including Depo and Nexplanon and patient opts for POPs.  - Prescription sent to her pharmacy. - would like to have prescription of PNV, sent to pharmacy   Routine preventative health maintenance measures emphasized. Please refer to After Visit Summary for other counseling recommendations.   Return if symptoms worsen or fail to improve.   Total face-to-face time with patient: 30 minutes.  Over 50% of encounter was spent on counseling and coordination of care.  Warner Mccreedy, MD, MPH OB Fellow, Faculty [facility], [facility]

## 2021-05-19 LAB — URINALYSIS
Bilirubin, UA: NEGATIVE
Glucose, UA: NEGATIVE
Ketones, UA: NEGATIVE
Leukocytes,UA: NEGATIVE
Nitrite, UA: NEGATIVE
Protein,UA: NEGATIVE
RBC, UA: NEGATIVE
Specific Gravity, UA: 1.019 (ref 1.005–1.030)
Urobilinogen, Ur: 0.2 mg/dL (ref 0.2–1.0)
pH, UA: 6 (ref 5.0–7.5)

## 2021-05-19 LAB — CBC
Hematocrit: 33.1 % — ABNORMAL LOW (ref 34.0–46.6)
Hemoglobin: 10.7 g/dL — ABNORMAL LOW (ref 11.1–15.9)
MCH: 25.7 pg — ABNORMAL LOW (ref 26.6–33.0)
MCHC: 32.3 g/dL (ref 31.5–35.7)
MCV: 80 fL (ref 79–97)
Platelets: 340 10*3/uL (ref 150–450)
RBC: 4.16 x10E6/uL (ref 3.77–5.28)
RDW: 13.2 % (ref 11.7–15.4)
WBC: 7.7 10*3/uL (ref 3.4–10.8)

## 2021-05-19 LAB — BETA HCG QUANT (REF LAB): hCG Quant: 1 m[IU]/mL

## 2021-05-20 ENCOUNTER — Telehealth: Payer: Self-pay

## 2021-05-20 ENCOUNTER — Other Ambulatory Visit: Payer: Self-pay | Admitting: Family Medicine

## 2021-05-20 ENCOUNTER — Other Ambulatory Visit: Payer: Self-pay

## 2021-05-20 MED ORDER — FERROUS SULFATE 325 (65 FE) MG PO TABS: 325.0000 mg | ORAL_TABLET | ORAL | 0 refills | Status: DC

## 2021-05-20 MED ORDER — FERROUS SULFATE 325 (65 FE) MG PO TABS
325.0000 mg | ORAL_TABLET | ORAL | 0 refills | Status: DC
  Filled 2021-05-20: qty 15, 30d supply, fill #0

## 2021-05-20 NOTE — Telephone Encounter (Addendum)
-----   Message from Warner Mccreedy, MD sent at 05/20/2021 12:48 PM EDT ----- Regarding: Request to call patient regarding anemia Hi Could someone call this patient and let her know that her labs are back. Her pregnancy hormone is zero which is expected at this point after her miscarriage. She is just a little bit anemic so I have sent her iron pills to the pharmacy on file. Her urine test was normal.  Thanks Dr. Rondall Allegra pt with Spanish Interpreter [employer]., and informed pt results and that the provider has sent an Rx for iron tablet to her pharmacy.  Pt verbalized understanding with no further questions.    Leonette Nutting  05/20/21

## 2021-05-21 ENCOUNTER — Other Ambulatory Visit: Payer: Self-pay

## 2021-05-26 ENCOUNTER — Other Ambulatory Visit: Payer: Self-pay

## 2021-09-12 NOTE — L&D Delivery Note (Signed)
Delivery Note At 0547 a viable female infant was delivered via SVD, presentation: LOA. APGAR: 9, 9; weight pending.   Placenta status: spontaneously delivered intact with gentle cord traction. Fundus firm with massage and Pitocin.   Anesthesia: epidural Lacerations: 1st degree Suture used for repair: 2-0 Vicryl rapide Est. Blood Loss (mL): 36 Placenta to LD Complications none Cord ph n/a   Mom to postpartum. Baby to Couplet care / Skin to Skin.    Donette Larry, CNM 04/16/2022 6:08 AM

## 2021-10-18 ENCOUNTER — Other Ambulatory Visit: Payer: Self-pay

## 2021-10-18 ENCOUNTER — Ambulatory Visit (INDEPENDENT_AMBULATORY_CARE_PROVIDER_SITE_OTHER): Payer: Self-pay | Admitting: *Deleted

## 2021-10-18 ENCOUNTER — Encounter: Payer: Self-pay | Admitting: *Deleted

## 2021-10-18 DIAGNOSIS — O0990 Supervision of high risk pregnancy, unspecified, unspecified trimester: Secondary | ICD-10-CM | POA: Insufficient documentation

## 2021-10-18 DIAGNOSIS — Z3201 Encounter for pregnancy test, result positive: Secondary | ICD-10-CM

## 2021-10-18 DIAGNOSIS — Z3200 Encounter for pregnancy test, result unknown: Secondary | ICD-10-CM

## 2021-10-18 LAB — POCT PREGNANCY, URINE: Preg Test, Ur: POSITIVE — AB

## 2021-10-18 NOTE — Progress Notes (Addendum)
Patient dropped off urine for pregnancy test which was positive.  I called her with interpreter and heard a message " Voicemail is full".  Izek Corvino,RN I called Kara Dominguez a second time and she answered. I informed her her urine pregnancy test was positive. She confirms her LMP 07/17/21 and it was a normal period , she is sure of the date. I informed her she is [redacted]w[redacted]d pregnant with EDD 04/23/22.  I advised to start prenatal care asap and Prenatal Vitamins. She plans to start prenatal care here. I informed her of prenatal care options including Traditional Pregnancy care and CenteringPregnancy. She would like to try CenteringPregnancy.   Quentyn Kolbeck,RN

## 2021-10-19 ENCOUNTER — Ambulatory Visit (INDEPENDENT_AMBULATORY_CARE_PROVIDER_SITE_OTHER): Payer: Self-pay

## 2021-10-19 ENCOUNTER — Telehealth: Payer: Self-pay

## 2021-10-19 VITALS — BP 136/74 | HR 84 | Wt 208.1 lb

## 2021-10-19 DIAGNOSIS — O0990 Supervision of high risk pregnancy, unspecified, unspecified trimester: Secondary | ICD-10-CM

## 2021-10-19 MED ORDER — PRENATAL PLUS 27-1 MG PO TABS
1.0000 | ORAL_TABLET | Freq: Every day | ORAL | 11 refills | Status: DC
Start: 2021-10-19 — End: 2021-12-29

## 2021-10-19 NOTE — Telephone Encounter (Signed)
Called Pt using [employer] id# [id number] to inform of U/S less than 14 wks on 10/21/21 @ 10:45a, arrive at 10:30a with full bladder. Pt verbalized understanding.

## 2021-10-19 NOTE — Patient Instructions (Signed)
  At our [facility], we work as an integrated team, providing care to address both physical and emotional health. Your medical provider may refer you to see our Behavioral Health Clinician (BHC) on the same day you see your medical provider, as availability permits; often scheduled virtually at your convenience.  Our BHC is available to all patients, visits generally last between 20-30 minutes, but can be longer or shorter, depending on patient need. The BHC offers help with stress management, coping with symptoms of depression and anxiety, major life changes , sleep issues, changing risky behavior, grief and loss, life stress, working on personal life goals, and  behavioral health issues, as these all affect your overall health and wellness.  The BHC is NOT available for the following: FMLA paperwork, court-ordered evaluations, specialty assessments (custody or disability), letters to employers, or obtaining certification for an emotional support animal. The BHC does not provide long-term therapy. You have the right to refuse integrated behavioral health services, or to reschedule to see the BHC at a later date.  Confidentiality exception: If it is suspected that a child or disabled adult is being abused or neglected, we are required by law to report that to either Child Protective Services or Adult Protective Services.  If you have a diagnosis of Bipolar affective disorder, Schizophrenia, or recurrent Major depressive disorder, we will recommend that you establish care with a psychiatrist, as these are lifelong, chronic conditions, and we want your overall emotional health and medications to be more closely monitored. If you anticipate needing extended maternity leave due to mental health issues postpartum, it it recommended you inform your medical provider, so we can put in a referral to a psychiatrist as soon as possible. The BHC is unable to recommend an extended maternity leave for mental  health issues. Your medical provider or BHC may refer you to a therapist for ongoing, traditional therapy, or to a psychiatrist, for medication management, if it would benefit your overall health. Depending on your insurance, you may have a copay or be charged a deductible, depending on your insurance, to see the BHC. If you are uninsured, it is recommended that you apply for financial assistance. (Forms may be requested at the front desk for in-person visits, via MyChart, or request a form during a virtual visit).  If you see the BHC more than 6 times, you will have to complete a comprehensive clinical assessment interview with the BHC to resume integrated services.  For virtual visits with the BHC, you must be physically in the state of [place] at the time of the visit. For example, if you live in [place], you will have to do an in-person visit with the BHC, and your out-of-state insurance may not cover behavioral health services in [place]. If you are going out of the state or country for any reason, the BHC may see you virtually when you return to [place], but not while you are physically outside of [place].    

## 2021-10-19 NOTE — Progress Notes (Signed)
Informal bedside ultrasound performed to assess FHR, FHR 172bpm.  Chase Caller RN BSN 10/19/21

## 2021-10-19 NOTE — Progress Notes (Signed)
New OB Intake  I connected with  Kara Dominguez on 10/19/21 at  9:15 AM EST by In Person Visit and verified that I am speaking with the correct person using two identifiers. Nurse is located at [facility] and pt is located at [employer].  I discussed the limitations, risks, security and privacy concerns of performing an evaluation and management service by telephone and the availability of in person appointments. I also discussed with the patient that there may be a patient responsible charge related to this service. The patient expressed understanding and agreed to proceed.  I explained I am completing New OB Intake today. We discussed her EDD of 04/23/22 that is based on LMP of 07/17/21. Pt is G7/P4. I reviewed her allergies, medications, Medical/Surgical/OB history, and appropriate screenings. I informed her of [facility] services. Based on history, this is a/an  pregnancy complicated by AMA  .   Patient Active Problem List   Diagnosis Date Noted   Supervision of high risk pregnancy, antepartum 10/18/2021   Language barrier 06/08/2018   History of cesarean section 02/01/2018   Seborrheic dermatitis 01/03/2017   Shoulder dystocia 10/27/2014   History of preterm delivery 06/10/2014    Concerns addressed today  Delivery Plans:  Plans to deliver at [facility].   MyChart/Babyscripts MyChart access verified. I explained pt will have some visits in office and some virtually. Babyscripts instructions given and order placed. Patient verifies receipt of registration text/e-mail. Account successfully created and app downloaded.  Blood Pressure Cuff  Patient is self-pay; explained patient will be given BP cuff at first prenatal appt. Explained after first prenatal appt pt will check weekly and document in Babyscripts.  Weight scale: Patient does / does not  have weight scale. Weight scale ordered for patient to pick up from [employer].   Anatomy US Explained first scheduled [place] will be around 19  weeks. Anatomy US scheduled for 0 at 0. Pt notified to arrive at 0. Scheduled AFP lab only appointment if CenteringPregnancy pt for same day as anatomy US.   Labs Discussed Avelina Laine genetic screening with patient. Would like both Panorama and Horizon drawn at new OB visit.Also if interested in genetic testing, tell patient she will need AFP 15-21 weeks to complete genetic testing .Routine prenatal labs needed.  Covid Vaccine Patient has not covid vaccine.   CenteringPregnancy Candidate?  If yes, offer as possibility  Mother/ Baby Dyad Candidate?    If yes, offer as possibility  Informed patient of [facility] website  and placed link in her AVS.   Social Determinants of Health Food Insecurity: Patient denies food insecurity. WIC Referral: Patient is interested in referral to [facility].  Transportation: Patient denies transportation needs. Childcare: Discussed no children allowed at ultrasound appointments. Offered childcare services; patient declines childcare services at this time.  Send link to Pregnancy Navigators   Placed OB Box on problem list and updated  First visit review I reviewed new OB appt with pt. I explained she will have a pelvic exam, ob bloodwork with genetic screening, and PAP smear. Explained pt will be seen by Camelia Eng at first visit; encounter routed to appropriate provider. Explained that patient will be seen by pregnancy navigator following visit with provider. [facility] information placed in AVS.   [name], CMA 10/19/2021  9:30 AM

## 2021-10-19 NOTE — Progress Notes (Signed)
Patient was assessed and managed by nursing staff during this encounter. I have reviewed the chart and agree with the documentation and plan. I have also made any necessary editorial changes. ° °Emmalise Huard, MD °10/19/2021 10:18 AM  ° °

## 2021-10-20 ENCOUNTER — Telehealth: Payer: Self-pay | Admitting: *Deleted

## 2021-10-20 ENCOUNTER — Encounter: Payer: Self-pay | Admitting: *Deleted

## 2021-10-20 DIAGNOSIS — O24319 Unspecified pre-existing diabetes mellitus in pregnancy, unspecified trimester: Secondary | ICD-10-CM | POA: Insufficient documentation

## 2021-10-20 NOTE — Telephone Encounter (Signed)
I called Byrd Hesselbach with [employer], Interpreter and informed her she cannot be a CenteringPregnancy patient because she was diagnosed with Type 2 Diabetes from her postpartum glucose 08/2018.  She voices understanding. I asked if she is seeing a doctor for diabetes and she said yes, but could not tell me the name of the clinic. She states they told her and we told her she just has diabetes in pregnancy.  She is not on any meds. I explained we will discuss with at her New ob visit 11/04/21. She voices understanding. Chelsye Suhre,RN

## 2021-10-21 ENCOUNTER — Other Ambulatory Visit: Payer: Self-pay

## 2021-10-21 ENCOUNTER — Ambulatory Visit (INDEPENDENT_AMBULATORY_CARE_PROVIDER_SITE_OTHER): Payer: Self-pay

## 2021-10-21 ENCOUNTER — Ambulatory Visit
Admission: RE | Admit: 2021-10-21 | Discharge: 2021-10-21 | Disposition: A | Discharge status: Home / Self Care | Payer: Self-pay | Source: Ambulatory Visit

## 2021-10-21 VITALS — BP 127/83 | HR 79 | Wt 209.5 lb

## 2021-10-21 DIAGNOSIS — O0990 Supervision of high risk pregnancy, unspecified, unspecified trimester: Secondary | ICD-10-CM

## 2021-10-21 DIAGNOSIS — Z3491 Encounter for supervision of normal pregnancy, unspecified, first trimester: Secondary | ICD-10-CM

## 2021-10-21 NOTE — Progress Notes (Signed)
Pt here today for OB results for evaluation of fetal age.  Reviewed results with Dr. Alysia Penna who states pt has a good pregnancy with a cyst that will not harm the baby.  Pt informed with Spanish Interpreter that her U/S showed a good pregnancy with  FHR 182, 11w 2d, and EDD 05/10/22 and that she has a cyst.  Pt reports that she was aware of the cyst from prior U/S.  Pt is scheduled at [facility] for NEW OB appt on 11/04/21.  Pt verbalized understanding with no further questions.   Addison Naegeli, RN  10/21/21

## 2021-11-04 ENCOUNTER — Other Ambulatory Visit: Payer: Self-pay

## 2021-11-04 ENCOUNTER — Ambulatory Visit (INDEPENDENT_AMBULATORY_CARE_PROVIDER_SITE_OTHER): Payer: Self-pay

## 2021-11-04 VITALS — BP 117/60 | Wt 213.0 lb

## 2021-11-04 DIAGNOSIS — Z641 Problems related to multiparity: Secondary | ICD-10-CM

## 2021-11-04 DIAGNOSIS — E669 Obesity, unspecified: Secondary | ICD-10-CM

## 2021-11-04 DIAGNOSIS — Z8759 Personal history of other complications of pregnancy, childbirth and the puerperium: Secondary | ICD-10-CM

## 2021-11-04 DIAGNOSIS — Z789 Other specified health status: Secondary | ICD-10-CM

## 2021-11-04 DIAGNOSIS — Z113 Encounter for screening for infections with a predominantly sexual mode of transmission: Secondary | ICD-10-CM

## 2021-11-04 DIAGNOSIS — Z98891 History of uterine scar from previous surgery: Secondary | ICD-10-CM

## 2021-11-04 DIAGNOSIS — Z8751 Personal history of pre-term labor: Secondary | ICD-10-CM

## 2021-11-04 DIAGNOSIS — Z758 Other problems related to medical facilities and other health care: Secondary | ICD-10-CM

## 2021-11-04 DIAGNOSIS — E66811 Obesity, class 1: Secondary | ICD-10-CM

## 2021-11-04 DIAGNOSIS — Z3491 Encounter for supervision of normal pregnancy, unspecified, first trimester: Secondary | ICD-10-CM

## 2021-11-04 DIAGNOSIS — O09522 Supervision of elderly multigravida, second trimester: Secondary | ICD-10-CM

## 2021-11-04 DIAGNOSIS — Z3689 Encounter for other specified antenatal screening: Secondary | ICD-10-CM

## 2021-11-04 DIAGNOSIS — O24112 Pre-existing diabetes mellitus, type 2, in pregnancy, second trimester: Secondary | ICD-10-CM

## 2021-11-04 DIAGNOSIS — O0990 Supervision of high risk pregnancy, unspecified, unspecified trimester: Secondary | ICD-10-CM

## 2021-11-04 MED ORDER — ASPIRIN EC 81 MG PO TBEC: 81.0000 mg | DELAYED_RELEASE_TABLET | Freq: Every day | ORAL | 11 refills | Status: DC

## 2021-11-04 NOTE — Progress Notes (Signed)
Subjective:   Kara Dominguez is a 41 y.o. [id number] at [redacted]w[redacted]d by LMP being seen today for her first obstetrical visit.  Her obstetrical history is significant for advanced maternal age and obesity and has History of preterm delivery; Shoulder dystocia; Seborrheic dermatitis; History of cesarean section; Language barrier; Supervision of high risk pregnancy, antepartum; and Pre-existing diabetes mellitus affecting pregnancy, antepartum on their problem list.. Patient does intend to breast feed. Pregnancy history fully reviewed.  Patient reports no complaints.  HISTORY: OB History  Gravida Para Term Preterm AB Living  7 5 4 1  0 5  SAB IAB Ectopic Multiple Live Births  0 0 0 0 5    # Outcome Date GA Lbr Len/2nd Weight Sex Delivery Anes PTL Lv  7 Current           6 Term 06/29/18 [redacted]w[redacted]d 04:31 / 00:12 7 lb 12 oz (3.515 kg) M VBAC None  LIV     Name: CAMPILLO GOMEZ,BOY Piera     Apgar1: 8  Apgar5: 9  5 Term 10/26/14 [redacted]w[redacted]d 05:50 / 00:39 9 lb 2.4 oz (4.15 kg) M Vag-Spont None  LIV     Apgar1: 7  Apgar5: 9  4 Preterm 05/29/09 [redacted]w[redacted]d  4 lb 3 oz (1.899 kg) M Vag-Spont None Y LIV  3 Term 03/01/00 [redacted]w[redacted]d  8 lb (3.629 kg) F CS-LTranv EPI N LIV  2 Term 12/15/96 [redacted]w[redacted]d  7 lb (3.175 kg) M Vag-Spont None N LIV  1 Gravida            Past Medical History:  Diagnosis Date   Anemia    Gestational diabetes    Normal labor 06/29/2018   Obesity in pregnancy 05/04/2018   Preterm labor    Type 2 diabetes mellitus during pregnancy, antepartum 04/06/2018   Current Diabetic Medications:  Metformin and Insulin  [x]  Aspirin 81 mg daily after 12 weeks (? A2/B GDM)  For A2/B GDM or higher classes of DM [x]  Diabetes Education and Testing Supplies [x]  Nutrition Counsult [x]  Fetal ECHO after 20 weeks  [ ]  Eye exam for retina evaluation   Baseline and surveillance labs (pulled in from 04/08/2018, refresh links as needed)  Lab Results  Component Value Date   CREATI   Past Surgical History:  Procedure Laterality Date    CESAREAN SECTION     History reviewed. No pertinent family history. Social History   Tobacco Use   Smoking status: Never   Smokeless tobacco: Never  Vaping Use   Vaping Use: Never used  Substance Use Topics   Alcohol use: No   Drug use: No   No Known Allergies Current Outpatient Medications on File Prior to Visit  Medication Sig Dispense Refill   prenatal vitamin w/FE, FA (PRENATAL 1 + 1) 27-1 MG TABS tablet Take 1 tablet by mouth daily at 12 noon. 30 tablet 11   No current facility-administered medications on file prior to visit.   Indications for ASA therapy (per uptodate) One of the following: Previous pregnancy with preeclampsia, especially early onset and with an adverse outcome No Multifetal gestation No Chronic hypertension No Type 1 or 2 diabetes mellitus Yes Chronic kidney disease No Autoimmune disease (antiphospholipid syndrome, systemic lupus erythematosus) No  Two or more of the following: Nulliparity No Obesity (body mass index >30 kg/m2) Yes Family history of preeclampsia in mother or sister No Age ?35 years Yes Sociodemographic characteristics (African American race, low socioeconomic level) Yes Personal risk factors (eg, previous pregnancy with  low birth weight or small for gestational age infant, previous adverse pregnancy outcome [eg, stillbirth], interval >10 years between pregnancies) No   Exam   Vitals:   11/04/21 1450  BP: 117/60  Weight: 213 lb (96.6 kg)   Fetal Heart Rate (bpm): 150  Uterus:     Pelvic Exam: Perineum: deferred   Vulva: deferred   Vagina:  deferred   Cervix: deferred   Adnexa: deferred   Bony Pelvis: deferred  System: General: well-developed, well-nourished female in no acute distress   Breast:  normal appearance, no masses or tenderness   Skin: normal coloration and turgor, no rashes   Neurologic: oriented, normal, negative, normal mood   Extremities: normal strength, tone, and muscle mass, ROM of all joints is normal    HEENT PERRLA, extraocular movement intact and sclera clear, anicteric   Mouth/Teeth mucous membranes moist, pharynx normal without lesions and dental hygiene good   Neck supple and no masses   Cardiovascular: regular rate and rhythm   Respiratory:  no respiratory distress, normal breath sounds   Abdomen: soft, non-tender; bowel sounds normal; no masses,  no organomegaly     Assessment:   Pregnancy: [id number] Patient Active Problem List   Diagnosis Date Noted   Pre-existing diabetes mellitus affecting pregnancy, antepartum 10/20/2021   Supervision of high risk pregnancy, antepartum 10/18/2021   Language barrier 06/08/2018   History of cesarean section 02/01/2018   Seborrheic dermatitis 01/03/2017   Shoulder dystocia 10/27/2014   History of preterm delivery 06/10/2014     Plan:  1. Supervision of high risk pregnancy, antepartum - New OB. Doing well. No concerns - Welcomed to practice - Reviewed location of [facility] and [facility] - Anticipatory guidance for upcoming appointments provided   - CBC/D/Plt+RPR+Rh+ABO+RubIgG... - Hemoglobin A1c - AFP, Serum, Open Spina Bifida - Culture, OB Urine  2. Type 2 diabetes mellitus affecting pregnancy in second trimester, antepartum - Diagnosed after last pregnancy in 2019. Has not been checking blood sugars and is not currently on medications. Was previously on insulin during last pregnancy - Reports she has seen a doctor for her diabetes, but does not know the name of clinic. Reports she was told by that doctor that she "only had diabetes in pregnancy". - Patient given glucometer and supplies today. Will make referral to diabetes education - Start bASA today- rx sent to pharmacy  - Ambulatory referral to Nutrition and Diabetic Education  3. Multigravida of advanced maternal age in second trimester - Declines genetic screening  4. Grand multiparity   5. Language barrier - Pacific interpretor used through entirety of today's visit  6.  History of C-section - Desires TOLAC. Has had 3 successful VBAC's. - Consent at later visit  7. History of preterm delivery   8. Obesity (BMI 30.0-34.9)   9. Screening, antenatal, for fetal anatomic survey  - [place] MFM OB DETAIL +14 WK; Future  10. History of shoulder dystocia in prior pregnancy - 2016, 4th baby was 4150g    Initial labs drawn. Continue prenatal vitamins. Discussed and offered genetic screening options, including Quad screen/AFP, NIPS testing, and option to decline testing. Benefits/risks/alternatives reviewed. Pt aware that anatomy US is form of genetic screening with lower accuracy in detecting trisomies than blood work.  Pt chooses/declines genetic screening today. NIPS: declined. Ultrasound discussed; fetal anatomic survey: ordered. Problem list reviewed and updated. The nature of [facility] with multiple MDs and other Advanced Practice Providers was explained to patient; also emphasized that  residents, students are part of our team. Routine obstetric precautions reviewed. Return in about 4 weeks (around 12/02/2021).   Brand Males, CNM 11/05/21 8:04 AM

## 2021-11-05 LAB — CBC/D/PLT+RPR+RH+ABO+RUBIGG...
Antibody Screen: NEGATIVE
Basophils Absolute: 0.1 10*3/uL (ref 0.0–0.2)
Basos: 1 %
EOS (ABSOLUTE): 0.1 10*3/uL (ref 0.0–0.4)
Eos: 1 %
HCV Ab: NONREACTIVE
HIV Screen 4th Generation wRfx: NONREACTIVE
Hematocrit: 36.3 % (ref 34.0–46.6)
Hemoglobin: 12.1 g/dL (ref 11.1–15.9)
Hepatitis B Surface Ag: NEGATIVE
Immature Grans (Abs): 0.1 10*3/uL (ref 0.0–0.1)
Immature Granulocytes: 1 %
Lymphocytes Absolute: 1.9 10*3/uL (ref 0.7–3.1)
Lymphs: 20 %
MCH: 27.9 pg (ref 26.6–33.0)
MCHC: 33.3 g/dL (ref 31.5–35.7)
MCV: 84 fL (ref 79–97)
Monocytes Absolute: 0.5 10*3/uL (ref 0.1–0.9)
Monocytes: 6 %
Neutrophils Absolute: 6.5 10*3/uL (ref 1.4–7.0)
Neutrophils: 71 %
Platelets: 292 10*3/uL (ref 150–450)
RBC: 4.34 x10E6/uL (ref 3.77–5.28)
RDW: 14.6 % (ref 11.7–15.4)
RPR Ser Ql: NONREACTIVE
Rh Factor: POSITIVE
Rubella Antibodies, IGG: 6.82 index (ref >0.99)
WBC: 9.2 10*3/uL (ref 3.4–10.8)

## 2021-11-05 LAB — HCV INTERPRETATION

## 2021-11-06 LAB — AFP, SERUM, OPEN SPINA BIFIDA
AFP MoM: 0.26
AFP Value: 6.9 ng/mL
Gest. Age on Collection Date: 15.5 weeks
Maternal Age At EDD: 42.6 yr
OSBR Risk 1 IN: 10000
Test Results:: NEGATIVE
Weight: 213 [lb_av]

## 2021-11-06 LAB — URINE CULTURE, OB REFLEX

## 2021-11-06 LAB — HEMOGLOBIN A1C
Est. average glucose Bld gHb Est-mCnc: 123 mg/dL
Hgb A1c MFr Bld: 5.9 % — ABNORMAL HIGH (ref 4.8–5.6)

## 2021-11-06 LAB — CULTURE, OB URINE

## 2021-11-18 ENCOUNTER — Encounter: Payer: Self-pay | Admitting: Registered"

## 2021-11-18 ENCOUNTER — Ambulatory Visit (INDEPENDENT_AMBULATORY_CARE_PROVIDER_SITE_OTHER): Payer: Self-pay | Admitting: Registered"

## 2021-11-18 ENCOUNTER — Other Ambulatory Visit: Payer: Self-pay

## 2021-11-18 DIAGNOSIS — E119 Type 2 diabetes mellitus without complications: Secondary | ICD-10-CM | POA: Insufficient documentation

## 2021-11-18 DIAGNOSIS — O24112 Pre-existing diabetes mellitus, type 2, in pregnancy, second trimester: Secondary | ICD-10-CM | POA: Insufficient documentation

## 2021-11-18 DIAGNOSIS — O24319 Unspecified pre-existing diabetes mellitus in pregnancy, unspecified trimester: Secondary | ICD-10-CM

## 2021-11-18 DIAGNOSIS — O09522 Supervision of elderly multigravida, second trimester: Secondary | ICD-10-CM | POA: Insufficient documentation

## 2021-11-18 DIAGNOSIS — Z3A00 Weeks of gestation of pregnancy not specified: Secondary | ICD-10-CM | POA: Insufficient documentation

## 2021-11-18 NOTE — Progress Notes (Signed)
In-person interpreter Raquel from [facility] ? ?Patient was seen for pre-existing diabetes in pregnancy self-management on 11/18/21  ?Start time 1118 and End time 1215  ? ?Estimated due date: 04/23/22 ? ?Clinical: ?Medications: aspirin, prenatal ?Medical History: Insulin controled GDM 3 yrs ago ?Labs: OGTT n/a, A1c 5.9%  ? ?Dietary and Lifestyle History: ?Pt states she has been checking BG 2 weeks. FBS 90-110 mg/dL; PPBG 47-425 mg/dL ? ?Pt had GDM education through [facility] on 04/10/2018 ? ?Physical Activity: ADL ?Stress: not assessed ?Sleep: not assessed ? ?24 hr Recall:  ?First Meal: chicken tostada, cabbage, grits ?Snack: ?Second meal: 4 enchiladas, 4-8 oz juice ?Snack: ?Third meal: bojangles biscuit, chicken, coleslaw ?Snack: ?Beverages: water, juice, (maybe coffee) ? ?NUTRITION INTERVENTION  ?Nutrition education (E-1) on the following topics:  ? ?Initial Follow-up ? ?[]  []  Definition of Gestational Diabetes ?[]  []  Why dietary management is important in controlling blood glucose ?[x]  []  Effects each nutrient has on blood glucose levels ?[]  []  Simple carbohydrates vs complex carbohydrates ?[]  []  Fluid intake ?[]  []  Creating a balanced meal plan ?[x]  []  Carbohydrate counting (identifying carbs) ?[x]  []  When to check blood glucose levels ?[x]  []  Proper blood glucose monitoring techniques ?[]  []  Effect of stress and stress reduction techniques  ?[]  []  Exercise effect on blood glucose levels, appropriate exercise during pregnancy ?[]  []  Importance of limiting caffeine and abstaining from alcohol and smoking ?[x]  []  Medications used for blood sugar control during pregnancy ?[]  []  Hypoglycemia and rule of 15 ?[]  []  Postpartum self care ? ?Patient already has a Prodigy meter, is testing pre breakfast and 2 hours after each meal. ? ?Patient instructed to monitor glucose levels: ?FBS: 60 - ? 95 mg/dL (some clinics use 90 for cutoff) ?1 hour: ? 140 mg/dL ?2 hour: ? mg/dL ? ?Patient received handouts: ?Nutrition Diabetes and  Pregnancy ?Carbohydrate Counting List ? ?Patient will be seen for follow-up as needed.  ?

## 2021-11-22 ENCOUNTER — Other Ambulatory Visit: Payer: Self-pay

## 2021-11-30 ENCOUNTER — Ambulatory Visit: Payer: Self-pay | Admitting: *Deleted

## 2021-11-30 ENCOUNTER — Other Ambulatory Visit: Payer: Self-pay

## 2021-11-30 ENCOUNTER — Ambulatory Visit (HOSPITAL_BASED_OUTPATIENT_CLINIC_OR_DEPARTMENT_OTHER): Payer: Self-pay | Admitting: Obstetrics

## 2021-11-30 ENCOUNTER — Encounter: Payer: Self-pay | Admitting: *Deleted

## 2021-11-30 ENCOUNTER — Ambulatory Visit: Payer: Self-pay

## 2021-11-30 ENCOUNTER — Other Ambulatory Visit: Payer: Self-pay | Admitting: *Deleted

## 2021-11-30 VITALS — BP 116/54 | HR 82

## 2021-11-30 DIAGNOSIS — Z3A17 17 weeks gestation of pregnancy: Secondary | ICD-10-CM | POA: Insufficient documentation

## 2021-11-30 DIAGNOSIS — O0990 Supervision of high risk pregnancy, unspecified, unspecified trimester: Secondary | ICD-10-CM | POA: Insufficient documentation

## 2021-11-30 DIAGNOSIS — O09522 Supervision of elderly multigravida, second trimester: Secondary | ICD-10-CM | POA: Insufficient documentation

## 2021-11-30 DIAGNOSIS — Z3689 Encounter for other specified antenatal screening: Secondary | ICD-10-CM | POA: Insufficient documentation

## 2021-11-30 DIAGNOSIS — O24319 Unspecified pre-existing diabetes mellitus in pregnancy, unspecified trimester: Secondary | ICD-10-CM

## 2021-11-30 DIAGNOSIS — O24112 Pre-existing diabetes mellitus, type 2, in pregnancy, second trimester: Secondary | ICD-10-CM | POA: Insufficient documentation

## 2021-11-30 DIAGNOSIS — Z362 Encounter for other antenatal screening follow-up: Secondary | ICD-10-CM

## 2021-11-30 NOTE — Progress Notes (Signed)
MFM Note ? ?Kara Dominguez was seen for a detailed fetal anatomy scan due to advanced maternal age (41 years old), maternal obesity, grand multiparity, and probable pregestational diabetes that is currently treated with diet modification. ? ?She has declined all screening tests for fetal aneuploidy in her current pregnancy. ? ?The overall fetal biometry measurements obtained today are consistent with an [facility] of May 10, 2022, making her [redacted] weeks pregnant today.  The [facility] of May 10, 2022 was also confirmed by a first trimester ultrasound. ? ?The views of the fetal anatomy were limited today due to the fetal position and her early gestational age. ? ?The patient was informed that anomalies may be missed due to technical limitations. If the fetus is in a suboptimal position or maternal habitus is increased, visualization of the fetus in the maternal uterus may be impaired. ? ?The following were discussed during today's consultation: ? ?Advanced maternal age in pregnancy ? ?The increased risk of fetal aneuploidy due to advanced maternal age was discussed.  ? ?Due to advanced maternal age, the patient was offered and declined an amniocentesis today for definitive diagnosis of fetal aneuploidy. ? ?She was also offered and declined a cell free DNA test to screen for fetal aneuploidy. ? ?Pregestational diabetes ? ?The implications and management of diabetes in pregnancy was discussed in detail with the patient.   ? ?She was advised to continue to monitor her fingersticks 4 times daily (fasting and 2 hours after each meal).   ? ?She was advised that our goals for her fingerstick values are fasting values of 90-95 or less and two-hour postprandial values of 120 or less.   ? ?Should the majority of her fingerstick results be above these values, she may have to be started on metformin or insulin to help her achieve better glycemic control. The patient was advised that getting her fingerstick values as close to these  goals as possible would provide her with the most optimal obstetrical outcome. ? ?Due to pregestational diabetes, the patient was referred to [facility] pediatric cardiology for a fetal echocardiogram.  We will continue to follow her with monthly growth ultrasounds throughout her pregnancy. ? ?The increased risk of polyhydramnios, fetal macrosomia, and preeclampsia associated with diabetes was also discussed.   ? ?Due to advanced maternal age and pregestational diabetes, weekly fetal testing should be started at 32 weeks. ? ?The patient was advised that delivery for well-controlled diabetes in pregnancy is usually recommended at around 39 weeks.  Delivery at 37 weeks may be considered should her glycemic control be poor. ? ?The patient stated that all of her questions have been answered to her satisfaction.   ? ?A follow-up exam was scheduled in 4 weeks.  We will reassess the fetal anatomy again at that exam. ? ?A total of 30 minutes was spent counseling and coordinating the care for this patient.  Greater than 50% of the time was spent in direct face-to-face contact.  ? ?All conversations were held with the patient today with the help of a Spanish interpreter. ?

## 2021-12-02 ENCOUNTER — Encounter: Payer: Self-pay | Admitting: Family Medicine

## 2021-12-06 ENCOUNTER — Encounter: Payer: Self-pay | Admitting: Obstetrics and Gynecology

## 2021-12-06 ENCOUNTER — Ambulatory Visit (INDEPENDENT_AMBULATORY_CARE_PROVIDER_SITE_OTHER): Payer: Self-pay | Admitting: Obstetrics and Gynecology

## 2021-12-06 ENCOUNTER — Other Ambulatory Visit: Payer: Self-pay

## 2021-12-06 VITALS — BP 112/78 | HR 91 | Wt 216.6 lb

## 2021-12-06 DIAGNOSIS — Z789 Other specified health status: Secondary | ICD-10-CM

## 2021-12-06 DIAGNOSIS — Z8751 Personal history of pre-term labor: Secondary | ICD-10-CM

## 2021-12-06 DIAGNOSIS — O34219 Maternal care for unspecified type scar from previous cesarean delivery: Secondary | ICD-10-CM

## 2021-12-06 DIAGNOSIS — O0990 Supervision of high risk pregnancy, unspecified, unspecified trimester: Secondary | ICD-10-CM

## 2021-12-06 DIAGNOSIS — O43199 Other malformation of placenta, unspecified trimester: Secondary | ICD-10-CM

## 2021-12-06 DIAGNOSIS — O24319 Unspecified pre-existing diabetes mellitus in pregnancy, unspecified trimester: Secondary | ICD-10-CM

## 2021-12-06 DIAGNOSIS — Z3A17 17 weeks gestation of pregnancy: Secondary | ICD-10-CM

## 2021-12-06 DIAGNOSIS — Z5941 Food insecurity: Secondary | ICD-10-CM

## 2021-12-06 MED ORDER — PROGESTERONE 200 MG PO CAPS: ORAL_CAPSULE | ORAL | 3 refills | Status: DC

## 2021-12-06 MED ORDER — ASPIRIN EC 81 MG PO TBEC: 81.0000 mg | DELAYED_RELEASE_TABLET | Freq: Every day | ORAL | 1 refills | Status: DC

## 2021-12-06 MED ORDER — METFORMIN HCL 500 MG PO TABS: 500.0000 mg | ORAL_TABLET | Freq: Two times a day (BID) | ORAL | 5 refills | Status: DC

## 2021-12-06 NOTE — Progress Notes (Signed)
? ?PRENATAL VISIT NOTE ? ?Subjective:  ?Kara Dominguez is a 41 y.o. [phone] at [redacted]w[redacted]d being seen today for ongoing prenatal care.  She is currently monitored for the following issues for this high-risk pregnancy and has History of preterm delivery; Shoulder dysplasia; Seborrheic dermatitis; VBAC (vaginal birth after Cesarean); Language barrier; Supervision of high risk pregnancy, antepartum; Pre-existing diabetes mellitus affecting pregnancy, antepartum; and Marginal insertion of umbilical cord affecting management of mother on their problem list. ? ?Patient reports no complaints.  Contractions: Not present. Vag. Bleeding: None.  Movement: Present. Denies leaking of fluid.  ? ?The following portions of the patient's history were reviewed and updated as appropriate: allergies, current medications, past family history, past medical history, past social history, past surgical history and problem list.  ? ?Objective:  ? ?Vitals:  ? 12/06/21 0950  ?BP: 112/78  ?Pulse: 91  ?Weight: 216 lb 9.6 oz (98.2 kg)  ? ? ?Fetal Status: Fetal Heart Rate (bpm): 145   Movement: Present    ? ?General:  Alert, oriented and cooperative. Patient is in no acute distress.  ?Skin: Skin is warm and dry. No rash noted.   ?Cardiovascular: Normal heart rate noted  ?Respiratory: Normal respiratory effort, no problems with respiration noted  ?Abdomen: Soft, gravid, appropriate for gestational age.  Pain/Pressure: Absent     ?Pelvic: Cervical exam deferred        ?Extremities: Normal range of motion.     ?Mental Status: Normal mood and affect. Normal behavior. Normal judgment and thought content.  ? ?Assessment and Plan:  ?Pregnancy: [id number] at [redacted]w[redacted]d ?1. Marginal insertion of umbilical cord affecting management of mother ?On anatomy u/s. See below ? ?2. Supervision of high risk pregnancy, antepartum ?Afp negative ?- Comprehensive metabolic panel ?- TSH ?- Protein / creatinine ratio, urine ? ?3. Food insecurity ?- AMBULATORY REFERRAL TO [facility] PROGRAM ? ?4. Pre-existing diabetes mellitus type 2 affecting pregnancy, antepartum ?Am fasting in the 90s to low 100s but mostly wnl ?2h pp breakfast in the 110s-120s but mostly wnl ?2h pp lunch and dinner in the 120s up to 180s but mostly in the 130s ?She has used nph and aspart in prior pregnancies and I told her I recommend starting insulin and probably would be fine with nph once a day with breakfast for now. Pt has needs a refresher on doing insulin again so will set up to see dm education and start metformin 500 bid with breakfast and dinner for now ? ?5. VBAC (vaginal birth after Cesarean) ?X 3. Tolac consent later in pregnancy ? ?6. History of preterm delivery ?Was on prometrium with last two pregnancies. Patient self pay then as well. Patient would like to do it again. Rx for prometrium qhs sent in ? ?7. Shoulder dystocia ?2016 SVD with 4150gm delivery at 39wks with GDMa2 (on glyburide) relieved with woods screw and posterior arm.  She had a 2019 SVD at 38wks with DM2 on insulin and no issues with delivery, 3515gm. ?D/w patient later in pregnancy and follow up third trimester ultrasounds ? ?8. Language barrier ?Interpreter used ? ?Preterm labor symptoms and general obstetric precautions including but not limited to vaginal bleeding, contractions, leaking of fluid and fetal movement were reviewed in detail with the patient. ?Please refer to After Visit Summary for other counseling recommendations.  ? ?Return in about 1 week (around 12/13/2021) for 7-10d , in person, high risk ob, md visit. ? ?Future Appointments  ?Date Time Provider [place]  ?12/29/2021  8:15 AM  [facility] NURSE [facility]  ?12/29/2021  8:30 AM [facility] US2 [facility]  ? ? ?Aletha Halim, MD ? ?

## 2021-12-07 LAB — COMPREHENSIVE METABOLIC PANEL
ALT: 32 IU/L (ref 0–32)
AST: 30 IU/L (ref 0–40)
Albumin/Globulin Ratio: 1.5 (ref 1.2–2.2)
Albumin: 3.8 g/dL (ref 3.8–4.8)
Alkaline Phosphatase: 100 IU/L (ref 44–121)
BUN/Creatinine Ratio: 18 (ref 9–23)
BUN: 9 mg/dL (ref 6–24)
Bilirubin Total: 0.2 mg/dL (ref 0.0–1.2)
CO2: 19 mmol/L — ABNORMAL LOW (ref 20–29)
Calcium: 8.9 mg/dL (ref 8.7–10.2)
Chloride: 103 mmol/L (ref 96–106)
Creatinine, Ser: 0.51 mg/dL — ABNORMAL LOW (ref 0.57–1.00)
Globulin, Total: 2.5 g/dL (ref 1.5–4.5)
Glucose: 86 mg/dL (ref 70–99)
Potassium: 4.4 mmol/L (ref 3.5–5.2)
Sodium: 137 mmol/L (ref 134–144)
Total Protein: 6.3 g/dL (ref 6.0–8.5)
eGFR: 119 mL/min/{1.73_m2} (ref >59)

## 2021-12-07 LAB — TSH: TSH: 2.72 u[IU]/mL (ref 0.450–4.500)

## 2021-12-07 LAB — PROTEIN / CREATININE RATIO, URINE
Creatinine, Urine: 70.2 mg/dL
Protein, Ur: 10.1 mg/dL
Protein/Creat Ratio: 144 mg/g creat (ref 0–200)

## 2021-12-13 ENCOUNTER — Ambulatory Visit (INDEPENDENT_AMBULATORY_CARE_PROVIDER_SITE_OTHER): Payer: Self-pay | Admitting: Obstetrics and Gynecology

## 2021-12-13 VITALS — BP 111/61 | HR 87 | Wt 214.4 lb

## 2021-12-13 DIAGNOSIS — Z8751 Personal history of pre-term labor: Secondary | ICD-10-CM

## 2021-12-13 DIAGNOSIS — O43199 Other malformation of placenta, unspecified trimester: Secondary | ICD-10-CM

## 2021-12-13 DIAGNOSIS — O0990 Supervision of high risk pregnancy, unspecified, unspecified trimester: Secondary | ICD-10-CM

## 2021-12-13 DIAGNOSIS — Z3A18 18 weeks gestation of pregnancy: Secondary | ICD-10-CM

## 2021-12-13 DIAGNOSIS — O24319 Unspecified pre-existing diabetes mellitus in pregnancy, unspecified trimester: Secondary | ICD-10-CM

## 2021-12-13 DIAGNOSIS — Z789 Other specified health status: Secondary | ICD-10-CM

## 2021-12-13 DIAGNOSIS — O34219 Maternal care for unspecified type scar from previous cesarean delivery: Secondary | ICD-10-CM

## 2021-12-13 DIAGNOSIS — Q740 Other congenital malformations of upper limb(s), including shoulder girdle: Secondary | ICD-10-CM

## 2021-12-13 MED ORDER — METFORMIN HCL 500 MG PO TABS: 1000.0000 mg | ORAL_TABLET | Freq: Two times a day (BID) | ORAL | 5 refills | Status: DC

## 2021-12-13 NOTE — Progress Notes (Signed)
? ?  PRENATAL VISIT NOTE ? ?Subjective:  ?Kara Dominguez is a 41 y.o. [phone] at [redacted]w[redacted]d being seen today for ongoing prenatal care.  She is currently monitored for the following issues for this high-risk pregnancy and has History of preterm delivery; Shoulder dysplasia; Seborrheic dermatitis; VBAC (vaginal birth after Cesarean); Language barrier; Supervision of high risk pregnancy, antepartum; Pre-existing diabetes mellitus affecting pregnancy, antepartum; and Marginal insertion of umbilical cord affecting management of mother on their problem list. ? ?Patient reports no complaints.  Contractions: Not present. Vag. Bleeding: None.  Movement: Present. Denies leaking of fluid.  ? ?The following portions of the patient's history were reviewed and updated as appropriate: allergies, current medications, past family history, past medical history, past social history, past surgical history and problem list.  ? ?Objective:  ? ?Vitals:  ? 12/13/21 1019  ?BP: 111/61  ?Pulse: 87  ?Weight: 214 lb 6.4 oz (97.3 kg)  ? ? ?Fetal Status: Fetal Heart Rate (bpm): 154   Movement: Present    ? ?General:  Alert, oriented and cooperative. Patient is in no acute distress.  ?Skin: Skin is warm and dry. No rash noted.   ?Cardiovascular: Normal heart rate noted  ?Respiratory: Normal respiratory effort, no problems with respiration noted  ?Abdomen: Soft, gravid, appropriate for gestational age.  Pain/Pressure: Absent     ?Pelvic: Cervical exam deferred        ?Extremities: Normal range of motion.  Edema: Trace  ?Mental Status: Normal mood and affect. Normal behavior. Normal judgment and thought content.  ? ?Assessment and Plan:  ?Pregnancy: [id number] at [redacted]w[redacted]d ?1. [redacted] weeks gestation of pregnancy ?Routine care. Has f/u mfm u/s on 4/19 ? ?2. Supervision of high risk pregnancy, antepartum ? ?3. Marginal insertion of umbilical cord affecting management of mother ? ?4. Pre-existing diabetes mellitus affecting pregnancy, antepartum ?Started on  metformin 500 w/ breakfast and with dinner. Sugars look better and pt states she's also changed her diet. AM fasting 80s-90s with one abnormal (100). 2h PP breakfast 140/135/120/112/92/140, lunch 138/119/245/145/116/87, dinner 116/125/135/160/121/142.  ? ?Pt with no GI s/s so will increase to 1000 bidac. Pt has 4/11 dm education visit. I told her that likely she'll need insulin at next visit; she was on nph and aspart last pregnancy. Pt is self pay currently ? ?Has fetal echo on 4/25 ? ?5. VBAC (vaginal birth after Cesarean) ? ?6. Shoulder dysplasia ? ?7. History of preterm delivery ?No issues with vaginal prometrium ? ?8. Language barrier ?Interpreter used ? ?Preterm labor symptoms and general obstetric precautions including but not limited to vaginal bleeding, contractions, leaking of fluid and fetal movement were reviewed in detail with the patient. ?Please refer to After Visit Summary for other counseling recommendations.  ? ?Return in 16 days (on 12/29/2021) for in person, high risk ob, md visit. ? ?Future Appointments  ?Date Time Provider Department Center  ?12/21/2021  9:15 AM [facility]  ?12/29/2021  8:15 AM [facility] NURSE [facility]  ?12/29/2021  8:30 AM [facility] US2 [facility]  ? ? ?[place] Bing, MD ? ?

## 2021-12-21 ENCOUNTER — Encounter: Payer: Self-pay | Admitting: Registered"

## 2021-12-21 ENCOUNTER — Telehealth: Payer: Self-pay

## 2021-12-21 ENCOUNTER — Other Ambulatory Visit: Payer: Self-pay

## 2021-12-21 ENCOUNTER — Other Ambulatory Visit: Payer: Self-pay | Admitting: Obstetrics and Gynecology

## 2021-12-21 ENCOUNTER — Ambulatory Visit (INDEPENDENT_AMBULATORY_CARE_PROVIDER_SITE_OTHER): Payer: Self-pay | Admitting: Registered"

## 2021-12-21 DIAGNOSIS — O24319 Unspecified pre-existing diabetes mellitus in pregnancy, unspecified trimester: Secondary | ICD-10-CM

## 2021-12-21 DIAGNOSIS — O09522 Supervision of elderly multigravida, second trimester: Secondary | ICD-10-CM | POA: Insufficient documentation

## 2021-12-21 DIAGNOSIS — E119 Type 2 diabetes mellitus without complications: Secondary | ICD-10-CM | POA: Insufficient documentation

## 2021-12-21 DIAGNOSIS — O24112 Pre-existing diabetes mellitus, type 2, in pregnancy, second trimester: Secondary | ICD-10-CM | POA: Insufficient documentation

## 2021-12-21 DIAGNOSIS — Z3A17 17 weeks gestation of pregnancy: Secondary | ICD-10-CM | POA: Insufficient documentation

## 2021-12-21 MED ORDER — "INSULIN SYRINGE 31G X 5/16"" 0.5 ML MISC": 1.0000 | Freq: Two times a day (BID) | 6 refills | Status: DC

## 2021-12-21 MED ORDER — INSULIN NPH (HUMAN) (ISOPHANE) 100 UNIT/ML ~~LOC~~ SUSP: SUBCUTANEOUS | 3 refills | Status: DC

## 2021-12-21 NOTE — Progress Notes (Signed)
In-person interpreter Raquel from [place] ? ? ? ?Insulin Instruction ?Patient was seen on 12/21/21 for insulin instruction.  ?Start time: 0928   End time: 1012 ? ?MD orders are:  ?NPH 10 units with breakfast and 5 units qhs ?Continue Metformin 1000 mg bid ? ?The following learning objectives were met by the patient during this visit: ? ? Insulin Action of NPH insulins ? Reviewed syringe & vial including # units per syringe and vial ? Hygiene and storage ? Drawing up single and mixed doses if using vials ?  Single dose ? Rotation of Sites ? Hypoglycemia- symptoms, causes, treatment choices ? Record keeping and MD follow up ? ?Patient demonstrated understanding of insulin administration by return demonstration. ? ?Patient received the following handouts: hypoglycemia in Spanish ?                                       ?Patient to start on insulin as Rx'd by MD ? ?Patient will be seen for follow-up in 2 weeks or as needed.  ?

## 2021-12-21 NOTE — Telephone Encounter (Signed)
Called pt with interpreter Raquel to review new insulin prescription. Pt agreeable. Will follow up with office if any issues purchasing insulin.  ?

## 2021-12-21 NOTE — Telephone Encounter (Addendum)
-----   Message from [place] Bing, MD sent at 12/21/2021 10:39 AM EDT ----- ?Regarding: RE: Insulin start? ?I figured she would need insulin again (she used insulin with her last pregnancy), but I wanted to see her sugars today. They aren't bad but I figure let's keep her on the metformin and start her on low dose NPH 10 units with breakfast and 5 units qhs. I've CC'ed the nurses on this ? ?Clinical pool, please call the patient and let her know I've sent in the NPH and to start that sometime this week and to stay on the metformin. Thanks ? ?Dr. Vergie Living ?----- Message ----- ?From: Carolan Shiver, RD ?Sent: 12/21/2021  10:20 AM EDT ?To:  Bing, MD ?Subject: Insulin start?                                ? ?Hi Dr Vergie Living, ? ?Per pt last visit with you she was expecting to have insulin instruction today but there are no insulin orders in her chart. I taught her how to use NPH vials today; was a refresher since she has used 3 yrs ago. Copy of her glucose log is in my visit notes. ? ?Her next ob visit is 4/19. Do you want her to start insulin before next visit? If so, will you please put in order and have RN call her with the dosing instruction as well as whether or not to continue Metformin? ? ?Thanks ? ? ? ?

## 2021-12-29 ENCOUNTER — Ambulatory Visit: Payer: Self-pay | Attending: Obstetrics

## 2021-12-29 ENCOUNTER — Ambulatory Visit: Payer: Self-pay | Attending: Maternal & Fetal Medicine | Admitting: Maternal & Fetal Medicine

## 2021-12-29 ENCOUNTER — Other Ambulatory Visit: Payer: Self-pay | Admitting: *Deleted

## 2021-12-29 ENCOUNTER — Encounter: Payer: Self-pay | Admitting: Obstetrics & Gynecology

## 2021-12-29 ENCOUNTER — Encounter: Payer: Self-pay | Admitting: *Deleted

## 2021-12-29 ENCOUNTER — Ambulatory Visit: Payer: Self-pay | Admitting: *Deleted

## 2021-12-29 ENCOUNTER — Ambulatory Visit (INDEPENDENT_AMBULATORY_CARE_PROVIDER_SITE_OTHER): Payer: Self-pay | Admitting: Obstetrics & Gynecology

## 2021-12-29 VITALS — BP 113/63 | HR 84 | Wt 217.0 lb

## 2021-12-29 VITALS — BP 122/61 | HR 86

## 2021-12-29 DIAGNOSIS — O35EXX0 Maternal care for other (suspected) fetal abnormality and damage, fetal genitourinary anomalies, not applicable or unspecified: Secondary | ICD-10-CM

## 2021-12-29 DIAGNOSIS — O43199 Other malformation of placenta, unspecified trimester: Secondary | ICD-10-CM

## 2021-12-29 DIAGNOSIS — O09522 Supervision of elderly multigravida, second trimester: Secondary | ICD-10-CM

## 2021-12-29 DIAGNOSIS — O0990 Supervision of high risk pregnancy, unspecified, unspecified trimester: Secondary | ICD-10-CM

## 2021-12-29 DIAGNOSIS — O24319 Unspecified pre-existing diabetes mellitus in pregnancy, unspecified trimester: Secondary | ICD-10-CM | POA: Insufficient documentation

## 2021-12-29 DIAGNOSIS — Z3A21 21 weeks gestation of pregnancy: Secondary | ICD-10-CM

## 2021-12-29 DIAGNOSIS — O24012 Pre-existing diabetes mellitus, type 1, in pregnancy, second trimester: Secondary | ICD-10-CM

## 2021-12-29 DIAGNOSIS — O99212 Obesity complicating pregnancy, second trimester: Secondary | ICD-10-CM

## 2021-12-29 DIAGNOSIS — O24112 Pre-existing diabetes mellitus, type 2, in pregnancy, second trimester: Secondary | ICD-10-CM

## 2021-12-29 DIAGNOSIS — Q625 Duplication of ureter: Secondary | ICD-10-CM

## 2021-12-29 DIAGNOSIS — O43192 Other malformation of placenta, second trimester: Secondary | ICD-10-CM

## 2021-12-29 DIAGNOSIS — O34219 Maternal care for unspecified type scar from previous cesarean delivery: Secondary | ICD-10-CM

## 2021-12-29 DIAGNOSIS — Z362 Encounter for other antenatal screening follow-up: Secondary | ICD-10-CM | POA: Insufficient documentation

## 2021-12-29 MED ORDER — PRENATAL PLUS 27-1 MG PO TABS
1.0000 | ORAL_TABLET | Freq: Every day | ORAL | 11 refills | Status: AC
Start: 2021-12-29 — End: ?

## 2021-12-29 NOTE — Progress Notes (Signed)
MFM Brief Note ? ?Kara Dominguez is a G6P4 who is here at 34 w 1 d for follow up growth to complete the fetal anatomy and assess growth given her diagnosis of T2DM and marginal cord insertion. ? ?She is being seen at the request of Parks Ranger, MD ? ?Normal interval growth with measurements consistent with dates ?Good fetal movement and amniotic fluid volume  ? 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 ? ?Duplication of the renal collecting system occurs when two distinct, upper and lower, poles of the kidney drain into a respective ureter. Approximately one third of the duplex kidney is drained by the upper pole ureter and two-thirds drained by the lower pole ureter. Many individuals with duplex kidneys are asymptomatic and have no impairment of renal function. The prevalence of duplicated collecting system is difficult to estimate because most cases are not identified prenatally. Prenatally detected renal duplication occurs in approximately 1:3000 to 1:5000 live births.  ? ?Unilateral cases are more likely to affect the left kidney, as in this case. Duplication is generally considered to be a sporadic anomaly, although an increased incidence has been described in some families, suggesting autosomal dominant inheritance with incomplete penetrance and variable expressivity. If the diagnosis of a duplicated collecting system is made prenatally, neonatal evaluation typically incudes a renal ultrasound in post-delivery period. The major benefit of prenatal diagnosis appears to be in the use of prophylactic antibiotics to prevent future urinary tract infections.  ? ?I discussed today's finding of left unilateral renal pyelectasis with a measurement of 6  mm. I discussed the etiology of renal pyelectasis to include normal variant, ureteropelvic or vesicle junction obstruction and urethrovesicle reflux. Prior to 28 weeks the threshold for abnormal is <62mm but after 28 weeks >7 mm.  The renal pelvis measures SFU grade 1 appearnace without renal caliectasis. ? ?Lastly, Kara Dominguez reports good blood sugars. We were able to affirm her FBS and 2hr pp goals. ? ?Follow up growth in 4 weeks ?Initiate weekly testing at 32 weeks.  ? ?I spent 30 minutes with > 50% in face to face consultation. ? ?Vikki Ports, MD ?

## 2021-12-29 NOTE — Addendum Note (Signed)
Addended by: Verita Schneiders A on: 12/29/2021 10:38 AM ? ? Modules accepted: Orders ? ?

## 2021-12-29 NOTE — Progress Notes (Signed)
? ?  PRENATAL VISIT NOTE ? ?Subjective:  ?Kara Dominguez is a 41 y.o. [phone] at [redacted]w[redacted]d being seen today for ongoing prenatal care. Patient is Spanish-speaking only, interpreter present for this encounter. She is currently monitored for the following issues for this high-risk pregnancy and has History of preterm delivery; Shoulder dysplasia; Seborrheic dermatitis; VBAC (vaginal birth after Cesarean); Language barrier; Supervision of high risk pregnancy, antepartum; Pre-existing diabetes mellitus affecting pregnancy, antepartum; and Marginal insertion of umbilical cord affecting management of mother on their problem list. ? ?Patient reports no complaints.  Contractions: Not present. Vag. Bleeding: None.  Movement: Present. Denies leaking of fluid.  ? ?The following portions of the patient's history were reviewed and updated as appropriate: allergies, current medications, past family history, past medical history, past social history, past surgical history and problem list.  ? ?Objective:  ? ?Vitals:  ? 12/29/21 1006  ?BP: 113/63  ?Pulse: 84  ?Weight: 217 lb (98.4 kg)  ? ? ?Fetal Status: Fetal Heart Rate (bpm): 150   Movement: Present    ? ?General:  Alert, oriented and cooperative. Patient is in no acute distress.  ?Skin: Skin is warm and dry. No rash noted.   ?Cardiovascular: Normal heart rate noted  ?Respiratory: Normal respiratory effort, no problems with respiration noted  ?Abdomen: Soft, gravid, appropriate for gestational age.  Pain/Pressure: Absent     ?Pelvic: Cervical exam deferred        ?Extremities: Normal range of motion.     ?Mental Status: Normal mood and affect. Normal behavior. Normal judgment and thought content.  ? ?Assessment and Plan:  ?Pregnancy: [id number] at [redacted]w[redacted]d ?1. Type 2 diabetes mellitus affecting pregnancy in second trimester, antepartum ?Blood sugars are within range.  Continue NPH 10 am and 5 pm, Metformin 1000 mg po bid.  ?Continue scans and antenatal testing as per MFM. ? ?2.  Marginal insertion of umbilical cord affecting management of mother ?3. Multigravida of advanced maternal age in second trimester ?Continue scans as per MFM. ? ?4. [redacted] weeks gestation of pregnancy ?5. Supervision of high risk pregnancy, antepartum ?Preterm labor symptoms and general obstetric precautions including but not limited to vaginal bleeding, contractions, leaking of fluid and fetal movement were reviewed in detail with the patient. ?Please refer to After Visit Summary for other counseling recommendations.  ? ?Return in about 4 weeks (around 01/26/2022) for OFFICE OB VISIT (MD only). ? ?Future Appointments  ?Date Time Provider [place]  ?01/04/2022  9:15 AM [facility]-EDUCATION [facility]  ?01/26/2022  3:30 PM [facility] NURSE [facility]  ?01/26/2022  3:45 PM [facility]-M[facility] US1 [facility]  ? ? ?Verita Schneiders, MD ? ?

## 2022-01-04 ENCOUNTER — Ambulatory Visit (INDEPENDENT_AMBULATORY_CARE_PROVIDER_SITE_OTHER): Payer: Self-pay | Admitting: Registered"

## 2022-01-04 ENCOUNTER — Encounter: Payer: Self-pay | Admitting: Registered"

## 2022-01-04 DIAGNOSIS — O24319 Unspecified pre-existing diabetes mellitus in pregnancy, unspecified trimester: Secondary | ICD-10-CM

## 2022-01-04 NOTE — Progress Notes (Signed)
Interpreter services provided by Raquel from [facility] ? ?Patient was seen on 01/04/22 for follow-up assessment and education for Pre-existing Diabetes in pregnancy. EDD 05/10/22, [redacted]w[redacted]d.  ? ?Pt states she is taking medication as directed: ?NPH 10 units (8 am) and 5 units (11 pm) ?Metformin 1000 mg bid ? ? ? ?The following learning objectives reviewed during follow-up visit: ? ?Absorption of insulin better in stomach vs legs ?Carb counting / amount of carbs to eat in each meal/snack ?Balancing carb with protein with meals and snacks ? ?Plan:  ?Try injecting morning insulin in the abdominal area for quick absorption, may help keep lunch values WNL ?Avoid eating more than 2 servings of carbs for snacks and include protein with your snacks as well. Plain [place] yogurt with fruit is one option. ? ? ?Patient instructed to monitor glucose levels: ?FBS: 60 - 95 mg/dl ?2 hour: <120 mg/dl ? ?Patient received the following handouts: A simple guide to macros (graphic) ? ?Patient will be seen for follow-up as needed.  ?

## 2022-01-26 ENCOUNTER — Ambulatory Visit: Payer: Self-pay | Admitting: *Deleted

## 2022-01-26 ENCOUNTER — Encounter: Payer: Self-pay | Admitting: Obstetrics and Gynecology

## 2022-01-26 ENCOUNTER — Ambulatory Visit: Payer: Self-pay | Attending: Maternal & Fetal Medicine

## 2022-01-26 VITALS — BP 108/61 | HR 77

## 2022-01-26 DIAGNOSIS — Z3A25 25 weeks gestation of pregnancy: Secondary | ICD-10-CM

## 2022-01-26 DIAGNOSIS — E119 Type 2 diabetes mellitus without complications: Secondary | ICD-10-CM

## 2022-01-26 DIAGNOSIS — O99212 Obesity complicating pregnancy, second trimester: Secondary | ICD-10-CM

## 2022-01-26 DIAGNOSIS — O0990 Supervision of high risk pregnancy, unspecified, unspecified trimester: Secondary | ICD-10-CM | POA: Insufficient documentation

## 2022-01-26 DIAGNOSIS — O24319 Unspecified pre-existing diabetes mellitus in pregnancy, unspecified trimester: Secondary | ICD-10-CM

## 2022-01-26 DIAGNOSIS — O24112 Pre-existing diabetes mellitus, type 2, in pregnancy, second trimester: Secondary | ICD-10-CM

## 2022-01-26 DIAGNOSIS — O34219 Maternal care for unspecified type scar from previous cesarean delivery: Secondary | ICD-10-CM | POA: Insufficient documentation

## 2022-01-26 DIAGNOSIS — O35EXX0 Maternal care for other (suspected) fetal abnormality and damage, fetal genitourinary anomalies, not applicable or unspecified: Secondary | ICD-10-CM | POA: Insufficient documentation

## 2022-01-26 DIAGNOSIS — O43192 Other malformation of placenta, second trimester: Secondary | ICD-10-CM

## 2022-01-26 DIAGNOSIS — O24012 Pre-existing diabetes mellitus, type 1, in pregnancy, second trimester: Secondary | ICD-10-CM | POA: Insufficient documentation

## 2022-01-26 DIAGNOSIS — E669 Obesity, unspecified: Secondary | ICD-10-CM

## 2022-01-27 ENCOUNTER — Encounter: Payer: Self-pay | Admitting: Family Medicine

## 2022-01-27 ENCOUNTER — Other Ambulatory Visit: Payer: Self-pay | Admitting: *Deleted

## 2022-01-27 ENCOUNTER — Ambulatory Visit (INDEPENDENT_AMBULATORY_CARE_PROVIDER_SITE_OTHER): Payer: Self-pay | Admitting: Family Medicine

## 2022-01-27 VITALS — BP 108/72 | HR 87 | Wt 219.5 lb

## 2022-01-27 DIAGNOSIS — O09522 Supervision of elderly multigravida, second trimester: Secondary | ICD-10-CM

## 2022-01-27 DIAGNOSIS — O24112 Pre-existing diabetes mellitus, type 2, in pregnancy, second trimester: Secondary | ICD-10-CM

## 2022-01-27 DIAGNOSIS — O43192 Other malformation of placenta, second trimester: Secondary | ICD-10-CM

## 2022-01-27 DIAGNOSIS — O0990 Supervision of high risk pregnancy, unspecified, unspecified trimester: Secondary | ICD-10-CM

## 2022-01-27 DIAGNOSIS — Q639 Congenital malformation of kidney, unspecified: Secondary | ICD-10-CM

## 2022-01-27 DIAGNOSIS — Z3A25 25 weeks gestation of pregnancy: Secondary | ICD-10-CM

## 2022-01-27 DIAGNOSIS — O99212 Obesity complicating pregnancy, second trimester: Secondary | ICD-10-CM

## 2022-01-27 DIAGNOSIS — O24319 Unspecified pre-existing diabetes mellitus in pregnancy, unspecified trimester: Secondary | ICD-10-CM

## 2022-01-27 DIAGNOSIS — O34219 Maternal care for unspecified type scar from previous cesarean delivery: Secondary | ICD-10-CM

## 2022-01-27 NOTE — Progress Notes (Signed)
   PRENATAL VISIT NOTE  Subjective:  Kara Dominguez is a 41 y.o. [phone] at [redacted]w[redacted]d being seen today for ongoing prenatal care.  She is currently monitored for the following issues for this high-risk pregnancy and has History of preterm delivery; Shoulder dysplasia; Seborrheic dermatitis; VBAC (vaginal birth after Cesarean); Language barrier; Supervision of high risk pregnancy, antepartum; Pre-existing diabetes mellitus affecting pregnancy, antepartum; and Marginal insertion of umbilical cord affecting management of mother on their problem list.  Patient reports no complaints.  Contractions: Not present. Vag. Bleeding: None.  Movement: Present. Denies leaking of fluid.   The following portions of the patient's history were reviewed and updated as appropriate: allergies, current medications, past family history, past medical history, past social history, past surgical history and problem list.   Objective:   Vitals:   01/27/22 1406  BP: 108/72  Pulse: 87  Weight: 219 lb 8 oz (99.6 kg)    Fetal Status: Fetal Heart Rate (bpm): 151   Movement: Present     General:  Alert, oriented and cooperative. Patient is in no acute distress.  Skin: Skin is warm and dry. No rash noted.   Cardiovascular: Normal heart rate noted  Respiratory: Normal respiratory effort, no problems with respiration noted  Abdomen: Soft, gravid, appropriate for gestational age.  Pain/Pressure: Absent     Pelvic: Cervical exam deferred        Extremities: Normal range of motion.     Mental Status: Normal mood and affect. Normal behavior. Normal judgment and thought content.     Assessment and Plan:  Pregnancy: [id number] at [redacted]w[redacted]d  1. Pre-existing diabetes mellitus affecting pregnancy, antepartum Reviewed log, well controlled-- see above Continue current insulin doses Needs strips and lancets today  2. VBAC (vaginal birth after Cesarean) Desires VBAC  3. Supervision of high risk pregnancy, antepartum Reviewed  need for 28 week labs (minus GTT) Given letter for [facility] for Tdap Reviewed plan for regular [place] for growth and discussed last [place]-- patient was concerned about the liquid around baby but AFI was WNL on last [place]    Preterm labor symptoms and general obstetric precautions including but not limited to vaginal bleeding, contractions, leaking of fluid and fetal movement were reviewed in detail with the patient. Please refer to After Visit Summary for other counseling recommendations.   No follow-ups on file.  Future Appointments  Date Time Provider Department Center  02/25/2022 10:30 AM [facility] NURSE [facility]  02/25/2022 10:45 AM [facility]-M[facility] US5 [facility]    Federico Flake, MD

## 2022-01-31 ENCOUNTER — Other Ambulatory Visit: Payer: Self-pay

## 2022-01-31 MED ORDER — METFORMIN HCL 500 MG PO TABS: 1000.0000 mg | ORAL_TABLET | Freq: Two times a day (BID) | ORAL | 5 refills | Status: DC

## 2022-02-25 ENCOUNTER — Ambulatory Visit: Payer: Self-pay | Admitting: *Deleted

## 2022-02-25 ENCOUNTER — Encounter: Payer: Self-pay | Admitting: *Deleted

## 2022-02-25 ENCOUNTER — Ambulatory Visit: Payer: Self-pay | Attending: Maternal & Fetal Medicine

## 2022-02-25 ENCOUNTER — Other Ambulatory Visit: Payer: Self-pay | Admitting: *Deleted

## 2022-02-25 VITALS — BP 113/61 | HR 90

## 2022-02-25 DIAGNOSIS — Z362 Encounter for other antenatal screening follow-up: Secondary | ICD-10-CM

## 2022-02-25 DIAGNOSIS — Q639 Congenital malformation of kidney, unspecified: Secondary | ICD-10-CM

## 2022-02-25 DIAGNOSIS — O24319 Unspecified pre-existing diabetes mellitus in pregnancy, unspecified trimester: Secondary | ICD-10-CM

## 2022-02-25 DIAGNOSIS — O09299 Supervision of pregnancy with other poor reproductive or obstetric history, unspecified trimester: Secondary | ICD-10-CM | POA: Insufficient documentation

## 2022-02-25 DIAGNOSIS — E669 Obesity, unspecified: Secondary | ICD-10-CM

## 2022-02-25 DIAGNOSIS — O99212 Obesity complicating pregnancy, second trimester: Secondary | ICD-10-CM

## 2022-02-25 DIAGNOSIS — O09522 Supervision of elderly multigravida, second trimester: Secondary | ICD-10-CM

## 2022-02-25 DIAGNOSIS — O09523 Supervision of elderly multigravida, third trimester: Secondary | ICD-10-CM

## 2022-02-25 DIAGNOSIS — O0990 Supervision of high risk pregnancy, unspecified, unspecified trimester: Secondary | ICD-10-CM

## 2022-02-25 DIAGNOSIS — O43192 Other malformation of placenta, second trimester: Secondary | ICD-10-CM

## 2022-02-25 DIAGNOSIS — O34219 Maternal care for unspecified type scar from previous cesarean delivery: Secondary | ICD-10-CM

## 2022-02-25 DIAGNOSIS — Q625 Duplication of ureter: Secondary | ICD-10-CM

## 2022-02-25 DIAGNOSIS — O24113 Pre-existing diabetes mellitus, type 2, in pregnancy, third trimester: Secondary | ICD-10-CM

## 2022-02-25 DIAGNOSIS — O358XX0 Maternal care for other (suspected) fetal abnormality and damage, not applicable or unspecified: Secondary | ICD-10-CM

## 2022-02-25 DIAGNOSIS — O43193 Other malformation of placenta, third trimester: Secondary | ICD-10-CM

## 2022-02-25 DIAGNOSIS — O24112 Pre-existing diabetes mellitus, type 2, in pregnancy, second trimester: Secondary | ICD-10-CM

## 2022-02-25 DIAGNOSIS — Z3A29 29 weeks gestation of pregnancy: Secondary | ICD-10-CM

## 2022-02-25 DIAGNOSIS — O99213 Obesity complicating pregnancy, third trimester: Secondary | ICD-10-CM

## 2022-03-02 ENCOUNTER — Ambulatory Visit (INDEPENDENT_AMBULATORY_CARE_PROVIDER_SITE_OTHER): Payer: Self-pay | Admitting: Obstetrics & Gynecology

## 2022-03-02 ENCOUNTER — Other Ambulatory Visit: Payer: Self-pay

## 2022-03-02 VITALS — BP 120/65 | HR 89 | Wt 219.5 lb

## 2022-03-02 DIAGNOSIS — Z3A30 30 weeks gestation of pregnancy: Secondary | ICD-10-CM

## 2022-03-02 DIAGNOSIS — O34219 Maternal care for unspecified type scar from previous cesarean delivery: Secondary | ICD-10-CM

## 2022-03-02 DIAGNOSIS — Z23 Encounter for immunization: Secondary | ICD-10-CM

## 2022-03-02 DIAGNOSIS — O43199 Other malformation of placenta, unspecified trimester: Secondary | ICD-10-CM

## 2022-03-02 DIAGNOSIS — O24319 Unspecified pre-existing diabetes mellitus in pregnancy, unspecified trimester: Secondary | ICD-10-CM

## 2022-03-02 DIAGNOSIS — O0990 Supervision of high risk pregnancy, unspecified, unspecified trimester: Secondary | ICD-10-CM

## 2022-03-02 MED ORDER — INSULIN NPH (HUMAN) (ISOPHANE) 100 UNIT/ML ~~LOC~~ SUSP: SUBCUTANEOUS | 3 refills | Status: DC

## 2022-03-02 NOTE — Progress Notes (Signed)
   PRENATAL VISIT NOTE  Subjective:  Kara Dominguez is a 41 y.o. [phone] at [redacted]w[redacted]d being seen today for ongoing prenatal care.  She is currently monitored for the following issues for this high-risk pregnancy and has History of preterm delivery; H/O shoulder dystocia in prior pregnancy, currently pregnant; Seborrheic dermatitis; VBAC (vaginal birth after Cesarean); Language barrier; Supervision of high risk pregnancy, antepartum; Pre-existing diabetes mellitus affecting pregnancy, antepartum; and Marginal insertion of umbilical cord affecting management of mother on their problem list.  Patient reports no complaints.  Contractions: Not present. Vag. Bleeding: None.  Movement: Present. Denies leaking of fluid.   The following portions of the patient's history were reviewed and updated as appropriate: allergies, current medications, past family history, past medical history, past social history, past surgical history and problem list.   Objective:   Vitals:   03/02/22 1135  BP: 120/65  Pulse: 89  Weight: 219 lb 8 oz (99.6 kg)    Fetal Status: Fetal Heart Rate (bpm): 164   Movement: Present     General:  Alert, oriented and cooperative. Patient is in no acute distress.  Skin: Skin is warm and dry. No rash noted.   Cardiovascular: Normal heart rate noted  Respiratory: Normal respiratory effort, no problems with respiration noted  Abdomen: Soft, gravid, appropriate for gestational age.  Pain/Pressure: Present     Pelvic: Cervical exam deferred        Extremities: Normal range of motion.  Edema: None  Mental Status: Normal mood and affect. Normal behavior. Normal judgment and thought content.   Assessment and Plan:  Pregnancy: [id number] at [redacted]w[redacted]d 1. Pre-existing diabetes mellitus affecting pregnancy, antepartum Fbs <95 and PP up to 130's - insulin NPH Human (NOVOLIN N) 100 UNIT/ML injection; 12 units with breakfast and 8 units right before bed  Dispense: 10 mL; Refill: 3  2.  Supervision of high risk pregnancy, antepartum 28 week labs - CBC - HIV antibody (with reflex) - RPR - Tdap vaccine greater than or equal to 7yo IM  3. VBAC (vaginal birth after Cesarean) Signed TOLAC consent  4. Marginal insertion of umbilical cord affecting management of mother F/u US  Preterm labor symptoms and general obstetric precautions including but not limited to vaginal bleeding, contractions, leaking of fluid and fetal movement were reviewed in detail with the patient. Please refer to After Visit Summary for other counseling recommendations.   Return in about 2 weeks (around 03/16/2022).  Future Appointments  Date Time Provider Department Center  03/18/2022 10:30 AM [facility] NURSE [facility]  03/18/2022 10:45 AM [facility]-M[facility] US4 [facility]  03/25/2022  9:15 AM [facility] NURSE [facility]  03/25/2022  9:30 AM [facility] US3 [facility]  04/01/2022 10:30 AM [facility]MFC NURSE [facility]  04/01/2022 10:45 AM [facility]-MFC US5 [facility]    Scheryl Darter, MD

## 2022-03-03 LAB — HIV ANTIBODY (ROUTINE TESTING W REFLEX): HIV Screen 4th Generation wRfx: NONREACTIVE

## 2022-03-03 LAB — RPR: RPR Ser Ql: NONREACTIVE

## 2022-03-03 LAB — CBC
Hematocrit: 34.4 % (ref 34.0–46.6)
Hemoglobin: 11.6 g/dL (ref 11.1–15.9)
MCH: 28.7 pg (ref 26.6–33.0)
MCHC: 33.7 g/dL (ref 31.5–35.7)
MCV: 85 fL (ref 79–97)
Platelets: 246 10*3/uL (ref 150–450)
RBC: 4.04 x10E6/uL (ref 3.77–5.28)
RDW: 13.4 % (ref 11.7–15.4)
WBC: 7.7 10*3/uL (ref 3.4–10.8)

## 2022-03-18 ENCOUNTER — Other Ambulatory Visit: Payer: Self-pay | Admitting: Maternal & Fetal Medicine

## 2022-03-18 ENCOUNTER — Ambulatory Visit: Payer: Self-pay | Admitting: *Deleted

## 2022-03-18 ENCOUNTER — Ambulatory Visit: Payer: Self-pay | Attending: Obstetrics and Gynecology

## 2022-03-18 ENCOUNTER — Other Ambulatory Visit: Payer: Self-pay | Admitting: *Deleted

## 2022-03-18 VITALS — BP 113/54 | HR 85

## 2022-03-18 DIAGNOSIS — O09299 Supervision of pregnancy with other poor reproductive or obstetric history, unspecified trimester: Secondary | ICD-10-CM | POA: Insufficient documentation

## 2022-03-18 DIAGNOSIS — O43193 Other malformation of placenta, third trimester: Secondary | ICD-10-CM

## 2022-03-18 DIAGNOSIS — O09523 Supervision of elderly multigravida, third trimester: Secondary | ICD-10-CM

## 2022-03-18 DIAGNOSIS — Q625 Duplication of ureter: Secondary | ICD-10-CM

## 2022-03-18 DIAGNOSIS — O24113 Pre-existing diabetes mellitus, type 2, in pregnancy, third trimester: Secondary | ICD-10-CM

## 2022-03-18 DIAGNOSIS — O34219 Maternal care for unspecified type scar from previous cesarean delivery: Secondary | ICD-10-CM

## 2022-03-18 DIAGNOSIS — Z3A32 32 weeks gestation of pregnancy: Secondary | ICD-10-CM

## 2022-03-18 DIAGNOSIS — O24319 Unspecified pre-existing diabetes mellitus in pregnancy, unspecified trimester: Secondary | ICD-10-CM

## 2022-03-18 DIAGNOSIS — O0990 Supervision of high risk pregnancy, unspecified, unspecified trimester: Secondary | ICD-10-CM

## 2022-03-18 DIAGNOSIS — E669 Obesity, unspecified: Secondary | ICD-10-CM

## 2022-03-18 DIAGNOSIS — O358XX0 Maternal care for other (suspected) fetal abnormality and damage, not applicable or unspecified: Secondary | ICD-10-CM

## 2022-03-18 DIAGNOSIS — O99213 Obesity complicating pregnancy, third trimester: Secondary | ICD-10-CM

## 2022-03-18 NOTE — Procedures (Signed)
Kara Dominguez [birth year] [redacted]w[redacted]d  Fetus A Non-Stress Test Interpretation for 03/18/22  Indication: Advanced Maternal Age >40 years  Fetal Heart Rate A Mode: External Baseline Rate (A): 165 bpm Variability: Moderate Accelerations: 15 x 15 Decelerations: None Multiple birth?: No  Uterine Activity Mode: Toco Contraction Frequency (min): 8-10 Contraction Duration (sec): 80-100 Contraction Quality: Mild Resting Tone Palpated: Relaxed Resting Time: Adequate  Interpretation (Fetal Testing) Nonstress Test Interpretation: Reactive Overall Impression: Reassuring for gestational age Comments: see note for prior attempt for NST. this tracing reviewed by Dr. Parke Poisson

## 2022-03-18 NOTE — Progress Notes (Unsigned)
While Attempting to get an NST, upon placing the cardio on abdomen, the transducer could not read the heart rate of the fetus.  Fetus was left on for 20 min and was unable to see any continuous tracing. Dr. Parke Poisson was informed of the inability to obtain an NST recording of the heart rate as the sonographer had said the baby was either tachycardic and or had an arrhythmia. Dr. Parke Poisson then did another u/s and looked at the [facility]. At this time it was in the 160's. I returned pt to the NST monitor and reapplied the cardio.  This time, the FHR was in the 160s.

## 2022-03-22 ENCOUNTER — Ambulatory Visit: Payer: Self-pay | Attending: Obstetrics and Gynecology | Admitting: *Deleted

## 2022-03-22 ENCOUNTER — Ambulatory Visit: Payer: Self-pay | Admitting: *Deleted

## 2022-03-22 VITALS — BP 114/50 | HR 88

## 2022-03-22 DIAGNOSIS — O368390 Maternal care for abnormalities of the fetal heart rate or rhythm, unspecified trimester, not applicable or unspecified: Secondary | ICD-10-CM

## 2022-03-22 DIAGNOSIS — O99213 Obesity complicating pregnancy, third trimester: Secondary | ICD-10-CM

## 2022-03-22 DIAGNOSIS — O0990 Supervision of high risk pregnancy, unspecified, unspecified trimester: Secondary | ICD-10-CM

## 2022-03-22 DIAGNOSIS — E669 Obesity, unspecified: Secondary | ICD-10-CM | POA: Insufficient documentation

## 2022-03-22 DIAGNOSIS — O24319 Unspecified pre-existing diabetes mellitus in pregnancy, unspecified trimester: Secondary | ICD-10-CM

## 2022-03-22 DIAGNOSIS — O24113 Pre-existing diabetes mellitus, type 2, in pregnancy, third trimester: Secondary | ICD-10-CM

## 2022-03-22 DIAGNOSIS — O43193 Other malformation of placenta, third trimester: Secondary | ICD-10-CM

## 2022-03-22 DIAGNOSIS — O09523 Supervision of elderly multigravida, third trimester: Secondary | ICD-10-CM

## 2022-03-22 DIAGNOSIS — O368330 Maternal care for abnormalities of the fetal heart rate or rhythm, third trimester, not applicable or unspecified: Secondary | ICD-10-CM | POA: Insufficient documentation

## 2022-03-22 DIAGNOSIS — Z3A33 33 weeks gestation of pregnancy: Secondary | ICD-10-CM | POA: Insufficient documentation

## 2022-03-22 DIAGNOSIS — E118 Type 2 diabetes mellitus with unspecified complications: Secondary | ICD-10-CM | POA: Insufficient documentation

## 2022-03-22 DIAGNOSIS — O09299 Supervision of pregnancy with other poor reproductive or obstetric history, unspecified trimester: Secondary | ICD-10-CM

## 2022-03-22 DIAGNOSIS — O09293 Supervision of pregnancy with other poor reproductive or obstetric history, third trimester: Secondary | ICD-10-CM

## 2022-03-22 NOTE — Procedures (Signed)
Rowen Hur [birth year] [redacted]w[redacted]d  Fetus A Non-Stress Test Interpretation for 03/22/22  Indication: Gestational Diabetes medication controlled  Fetal Heart Rate A Mode: External Baseline Rate (A): 150 bpm Variability: Moderate Accelerations: 15 x 15 Decelerations: Variable Multiple birth?: No  Uterine Activity Mode: Toco Contraction Frequency (min): 7-9 Contraction Duration (sec): 90 Contraction Quality: Mild Resting Tone Palpated: Relaxed Resting Time: Adequate  Interpretation (Fetal Testing) Nonstress Test Interpretation: Reactive Overall Impression: Reassuring for gestational age Comments: tracing reviewed by Dr. Grace Bushy

## 2022-03-24 ENCOUNTER — Ambulatory Visit (INDEPENDENT_AMBULATORY_CARE_PROVIDER_SITE_OTHER): Payer: Self-pay | Admitting: Obstetrics & Gynecology

## 2022-03-24 ENCOUNTER — Other Ambulatory Visit: Payer: Self-pay

## 2022-03-24 VITALS — BP 111/63 | HR 93 | Wt 217.9 lb

## 2022-03-24 DIAGNOSIS — O0990 Supervision of high risk pregnancy, unspecified, unspecified trimester: Secondary | ICD-10-CM

## 2022-03-24 DIAGNOSIS — O43193 Other malformation of placenta, third trimester: Secondary | ICD-10-CM

## 2022-03-24 DIAGNOSIS — Z3A33 33 weeks gestation of pregnancy: Secondary | ICD-10-CM

## 2022-03-24 DIAGNOSIS — O24319 Unspecified pre-existing diabetes mellitus in pregnancy, unspecified trimester: Secondary | ICD-10-CM

## 2022-03-24 DIAGNOSIS — O24313 Unspecified pre-existing diabetes mellitus in pregnancy, third trimester: Secondary | ICD-10-CM

## 2022-03-24 DIAGNOSIS — O43199 Other malformation of placenta, unspecified trimester: Secondary | ICD-10-CM

## 2022-03-24 DIAGNOSIS — O34219 Maternal care for unspecified type scar from previous cesarean delivery: Secondary | ICD-10-CM

## 2022-03-24 DIAGNOSIS — Z789 Other specified health status: Secondary | ICD-10-CM

## 2022-03-24 DIAGNOSIS — O0993 Supervision of high risk pregnancy, unspecified, third trimester: Secondary | ICD-10-CM

## 2022-03-24 MED ORDER — INSULIN NPH (HUMAN) (ISOPHANE) 100 UNIT/ML ~~LOC~~ SUSP: SUBCUTANEOUS | 3 refills | Status: DC

## 2022-03-24 NOTE — Progress Notes (Signed)
..    PRENATAL VISIT NOTE  Subjective:  Kara Dominguez is a 41 y.o. [phone] at [redacted]w[redacted]d being seen today for ongoing prenatal care.  She is currently monitored for the following issues for this high-risk pregnancy and has History of preterm delivery; H/O shoulder dystocia in prior pregnancy, currently pregnant; Seborrheic dermatitis; VBAC (vaginal birth after Cesarean); Language barrier; Supervision of high risk pregnancy, antepartum; Pre-existing diabetes mellitus affecting pregnancy, antepartum; and Marginal insertion of umbilical cord affecting management of mother on their problem list.  Patient reports some pressure in her suprapubic region.  Contractions: one felt and a few picked up on nonstress test on 7/11. Vag. Bleeding: None.  Movement: Present. Denies leaking of fluid.   The following portions of the patient's history were reviewed and updated as appropriate: allergies, current medications, past family history, past medical history, past social history, past surgical history and problem list.   Objective:   Vitals:   03/24/22 0936  BP: 111/63  Pulse: 93  Weight: 217 lb 14.4 oz (98.8 kg)    Fetal Status: Fetal Heart Rate (bpm): 144   Movement: Present     General:  Alert, oriented and cooperative. Patient is in no acute distress.  Skin: Skin is warm and dry. No rash noted.   Respiratory: Normal respiratory effort, no problems with respiration noted  Abdomen: Protrubrent, appropriate for gestational age.  Pain/Pressure: Present     Pelvic: Cervical exam deferred        Extremities: Normal range of motion.  Edema: Trace  Mental Status: Normal mood and affect. Normal behavior. Normal judgment and thought content.   Assessment and Plan:  Pregnancy: [id number] at [redacted]w[redacted]d 1. Pre-existing diabetes mellitus affecting pregnancy, antepartum - continue logging blood sugars throughout the day - Fbs <95 and PP up to 130's - insulin NPH Human (NOVOLIN N) 100 UNIT/ML injection; 14 units  with breakfast and 10 units right before bed  Dispense: 10 mL; Refill: 3  2. Supervision of high risk pregnancy, antepartum -[redacted]w[redacted]d - BP stable  3. Marginal insertion of umbilical cord affecting management of mother F/u US  4. Language barrier - translator present  5. VBAC (vaginal birth after Cesarean) Signed TOLAC consent  Preterm labor symptoms and general obstetric precautions including but not limited to vaginal bleeding, contractions, leaking of fluid and fetal movement were reviewed in detail with the patient. Please refer to After Visit Summary for other counseling recommendations.   Return in about 1 week (around 03/31/2022).  Future Appointments  Date Time Provider Department Center  03/25/2022  9:15 AM [facility] NURSE [facility]  03/25/2022  9:30 AM [facility] US3 [facility]  03/31/2022  1:15 PM Anyanwu, Jethro Bastos, MD [facility]  04/01/2022 10:30 AM W[facility] NURSE [facility]  04/01/2022 10:45 AM [facility] US5 [facility]  04/07/2022  8:55 AM Warden Fillers, MD [facility]    Joyice Faster, Student-PA

## 2022-03-25 ENCOUNTER — Ambulatory Visit: Payer: Self-pay | Attending: Obstetrics and Gynecology

## 2022-03-25 ENCOUNTER — Ambulatory Visit: Payer: Self-pay | Admitting: *Deleted

## 2022-03-25 ENCOUNTER — Other Ambulatory Visit: Payer: Self-pay | Admitting: *Deleted

## 2022-03-25 VITALS — BP 110/58 | HR 88

## 2022-03-25 DIAGNOSIS — E669 Obesity, unspecified: Secondary | ICD-10-CM

## 2022-03-25 DIAGNOSIS — Z794 Long term (current) use of insulin: Secondary | ICD-10-CM

## 2022-03-25 DIAGNOSIS — O0943 Supervision of pregnancy with grand multiparity, third trimester: Secondary | ICD-10-CM

## 2022-03-25 DIAGNOSIS — O24319 Unspecified pre-existing diabetes mellitus in pregnancy, unspecified trimester: Secondary | ICD-10-CM

## 2022-03-25 DIAGNOSIS — Z6833 Body mass index (BMI) 33.0-33.9, adult: Secondary | ICD-10-CM

## 2022-03-25 DIAGNOSIS — O35EXX0 Maternal care for other (suspected) fetal abnormality and damage, fetal genitourinary anomalies, not applicable or unspecified: Secondary | ICD-10-CM

## 2022-03-25 DIAGNOSIS — O09299 Supervision of pregnancy with other poor reproductive or obstetric history, unspecified trimester: Secondary | ICD-10-CM

## 2022-03-25 DIAGNOSIS — O09523 Supervision of elderly multigravida, third trimester: Secondary | ICD-10-CM

## 2022-03-25 DIAGNOSIS — O43199 Other malformation of placenta, unspecified trimester: Secondary | ICD-10-CM

## 2022-03-25 DIAGNOSIS — Z7984 Long term (current) use of oral hypoglycemic drugs: Secondary | ICD-10-CM

## 2022-03-25 DIAGNOSIS — O43193 Other malformation of placenta, third trimester: Secondary | ICD-10-CM

## 2022-03-25 DIAGNOSIS — O34219 Maternal care for unspecified type scar from previous cesarean delivery: Secondary | ICD-10-CM

## 2022-03-25 DIAGNOSIS — O0990 Supervision of high risk pregnancy, unspecified, unspecified trimester: Secondary | ICD-10-CM | POA: Insufficient documentation

## 2022-03-25 DIAGNOSIS — Z3A33 33 weeks gestation of pregnancy: Secondary | ICD-10-CM

## 2022-03-25 DIAGNOSIS — O24113 Pre-existing diabetes mellitus, type 2, in pregnancy, third trimester: Secondary | ICD-10-CM

## 2022-03-25 DIAGNOSIS — O99213 Obesity complicating pregnancy, third trimester: Secondary | ICD-10-CM

## 2022-03-25 DIAGNOSIS — Q625 Duplication of ureter: Secondary | ICD-10-CM | POA: Insufficient documentation

## 2022-03-25 DIAGNOSIS — E119 Type 2 diabetes mellitus without complications: Secondary | ICD-10-CM

## 2022-03-31 ENCOUNTER — Ambulatory Visit (INDEPENDENT_AMBULATORY_CARE_PROVIDER_SITE_OTHER): Payer: Self-pay | Admitting: Obstetrics & Gynecology

## 2022-03-31 ENCOUNTER — Other Ambulatory Visit: Payer: Self-pay

## 2022-03-31 VITALS — BP 128/75 | HR 102 | Wt 221.6 lb

## 2022-03-31 DIAGNOSIS — O0990 Supervision of high risk pregnancy, unspecified, unspecified trimester: Secondary | ICD-10-CM

## 2022-03-31 DIAGNOSIS — O34219 Maternal care for unspecified type scar from previous cesarean delivery: Secondary | ICD-10-CM

## 2022-03-31 DIAGNOSIS — Z3A34 34 weeks gestation of pregnancy: Secondary | ICD-10-CM

## 2022-03-31 DIAGNOSIS — Z789 Other specified health status: Secondary | ICD-10-CM

## 2022-03-31 DIAGNOSIS — O24112 Pre-existing diabetes mellitus, type 2, in pregnancy, second trimester: Secondary | ICD-10-CM

## 2022-03-31 NOTE — Progress Notes (Signed)
   PRENATAL VISIT NOTE  Subjective:  Kara Dominguez is a 41 y.o. [phone] at [redacted]w[redacted]d being seen today for ongoing prenatal care.  She is currently monitored for the following issues for this high-risk pregnancy and has History of preterm delivery; H/O shoulder dystocia in prior pregnancy, currently pregnant; Seborrheic dermatitis; History of VBAC x 3; Language barrier; Supervision of high risk pregnancy, antepartum; Pre-existing diabetes mellitus affecting pregnancy, antepartum; and Marginal insertion of umbilical cord affecting management of mother on their problem list.  Patient reports no bleeding, no cramping, no leaking, and occasional contractions.  Contractions: Irritability. Vag. Bleeding: None.  Movement: Present. Denies leaking of fluid.   The following portions of the patient's history were reviewed and updated as appropriate: allergies, current medications, past family history, past medical history, past social history, past surgical history and problem list.   Objective:   Vitals:   03/31/22 1323  BP: 128/75  Pulse: (!) 102  Weight: 221 lb 9.6 oz (100.5 kg)    Fetal Status: Fetal Heart Rate (bpm): 150   Movement: Present     General:  Alert, oriented and cooperative. Patient is in no acute distress.  Skin: Skin is warm and dry. No rash noted.   Cardiovascular: Normal heart rate noted  Respiratory: Normal respiratory effort, no problems with respiration noted  Abdomen: Soft, gravid, appropriate for gestational age.  Pain/Pressure: Present     Pelvic: Cervical exam deferred        Extremities: Normal range of motion.  Edema: Trace  Mental Status: Normal mood and affect. Normal behavior. Normal judgment and thought content.   Assessment and Plan:  Pregnancy: [id number] at [redacted]w[redacted]d 1. Supervision of high risk pregnancy, antepartum -[redacted]w[redacted]d -BP stable and wnl   2. [redacted] weeks gestation of pregnancy  3. Language barrier -Translator present  4. Type 2 diabetes mellitus  affecting pregnancy in second trimester, antepartum -Postprandial blood sugars mildly elevated in the 120s-130s.  Fasting blood sugar in the morning within normal limits with lowest of 78.  Currently on 14 units of NPH in the morning and 10 units at night.  Increasing to 16 units in the morning and 10 units at night.  Continue metformin 1000 mg in the morning and 500 mg at night. -Patient will continue MFM ultrasounds.  Most recent ultrasound 99th percentile  5. VBAC (vaginal birth after Cesarean) - Consent signed at previous visit  Preterm labor symptoms and general obstetric precautions including but not limited to vaginal bleeding, contractions, leaking of fluid and fetal movement were reviewed in detail with the patient. Please refer to After Visit Summary for other counseling recommendations.   No follow-ups on file.  Future Appointments  Date Time Provider Department Center  04/01/2022  9:30 AM [facility] NURSE [facility]  04/01/2022  9:45 AM [facility]MFC NST [facility]  04/01/2022 10:45 AM [facility] US5 [facility]  04/07/2022  8:55 AM Warden Fillers, MD [facility]  04/08/2022  3:00 PM W[facility] NURSE [facility]  04/08/2022  3:15 PM WMC-MFC NST [facility]  04/08/2022  3:45 PM WMC-MFC US1 [facility]  04/15/2022  3:00 PM W[facility]-MFC NURSE [facility]  04/15/2022  3:15 PM WMC-MFC NST [facility]  04/15/2022  3:45 PM [facility]-MFC US1 [facility]    Celedonio Savage, MD

## 2022-03-31 NOTE — Patient Instructions (Signed)
It was a pleasure seeing you today.  Your blood sugars are slightly higher than we would like and we are going to increase your insulin from 14 in the morning and 10 at night to 16 in the morning and 10 at night.  Please continue to keep a log of your blood sugars.  We will see you in 1 week for your next pre-natal visit.  If you have any issues between now and then please be evaluated at the maternal assessment unit.  I hope you have a wonderful afternoon!

## 2022-04-01 ENCOUNTER — Other Ambulatory Visit: Payer: Self-pay | Admitting: Maternal & Fetal Medicine

## 2022-04-01 ENCOUNTER — Ambulatory Visit: Payer: Self-pay | Admitting: *Deleted

## 2022-04-01 ENCOUNTER — Ambulatory Visit: Payer: Self-pay

## 2022-04-01 ENCOUNTER — Other Ambulatory Visit: Payer: Self-pay

## 2022-04-01 ENCOUNTER — Ambulatory Visit: Payer: Self-pay | Attending: Obstetrics and Gynecology

## 2022-04-01 VITALS — BP 115/65 | HR 87

## 2022-04-01 DIAGNOSIS — O43193 Other malformation of placenta, third trimester: Secondary | ICD-10-CM | POA: Insufficient documentation

## 2022-04-01 DIAGNOSIS — Z794 Long term (current) use of insulin: Secondary | ICD-10-CM

## 2022-04-01 DIAGNOSIS — O99213 Obesity complicating pregnancy, third trimester: Secondary | ICD-10-CM

## 2022-04-01 DIAGNOSIS — O09299 Supervision of pregnancy with other poor reproductive or obstetric history, unspecified trimester: Secondary | ICD-10-CM

## 2022-04-01 DIAGNOSIS — O0943 Supervision of pregnancy with grand multiparity, third trimester: Secondary | ICD-10-CM

## 2022-04-01 DIAGNOSIS — O09513 Supervision of elderly primigravida, third trimester: Secondary | ICD-10-CM

## 2022-04-01 DIAGNOSIS — O0990 Supervision of high risk pregnancy, unspecified, unspecified trimester: Secondary | ICD-10-CM | POA: Insufficient documentation

## 2022-04-01 DIAGNOSIS — Z7984 Long term (current) use of oral hypoglycemic drugs: Secondary | ICD-10-CM

## 2022-04-01 DIAGNOSIS — Q625 Duplication of ureter: Secondary | ICD-10-CM

## 2022-04-01 DIAGNOSIS — O24113 Pre-existing diabetes mellitus, type 2, in pregnancy, third trimester: Secondary | ICD-10-CM

## 2022-04-01 DIAGNOSIS — O24319 Unspecified pre-existing diabetes mellitus in pregnancy, unspecified trimester: Secondary | ICD-10-CM

## 2022-04-01 DIAGNOSIS — O09523 Supervision of elderly multigravida, third trimester: Secondary | ICD-10-CM

## 2022-04-01 DIAGNOSIS — E669 Obesity, unspecified: Secondary | ICD-10-CM

## 2022-04-01 DIAGNOSIS — E119 Type 2 diabetes mellitus without complications: Secondary | ICD-10-CM

## 2022-04-01 DIAGNOSIS — O24913 Unspecified diabetes mellitus in pregnancy, third trimester: Secondary | ICD-10-CM

## 2022-04-01 DIAGNOSIS — O35EXX0 Maternal care for other (suspected) fetal abnormality and damage, fetal genitourinary anomalies, not applicable or unspecified: Secondary | ICD-10-CM

## 2022-04-01 DIAGNOSIS — O34219 Maternal care for unspecified type scar from previous cesarean delivery: Secondary | ICD-10-CM

## 2022-04-01 DIAGNOSIS — Z3A34 34 weeks gestation of pregnancy: Secondary | ICD-10-CM

## 2022-04-01 NOTE — Procedures (Signed)
Kara Dominguez 1981/01/09 [redacted]w[redacted]d  Fetus A Non-Stress Test Interpretation for 04/01/22  Indication: Gestational Diabetes medication controlled  Fetal Heart Rate A Mode: External Baseline Rate (A): 150 bpm Variability: Moderate Accelerations: 15 x 15 Decelerations: Variable Multiple birth?: No  Uterine Activity Mode: Toco Contraction Frequency (min): 6-7 Contraction Duration (sec): 80-90 Contraction Quality: Mild Resting Tone Palpated: Relaxed Resting Time: Adequate  Interpretation (Fetal Testing) Nonstress Test Interpretation: Reactive Overall Impression: Reassuring for gestational age Comments: tracing reviewed by Dr. Judeth Cornfield

## 2022-04-07 ENCOUNTER — Ambulatory Visit (INDEPENDENT_AMBULATORY_CARE_PROVIDER_SITE_OTHER): Payer: Self-pay | Admitting: Obstetrics and Gynecology

## 2022-04-07 ENCOUNTER — Other Ambulatory Visit: Payer: Self-pay

## 2022-04-07 VITALS — BP 124/77 | HR 90 | Wt 220.4 lb

## 2022-04-07 DIAGNOSIS — Z789 Other specified health status: Secondary | ICD-10-CM

## 2022-04-07 DIAGNOSIS — O09299 Supervision of pregnancy with other poor reproductive or obstetric history, unspecified trimester: Secondary | ICD-10-CM

## 2022-04-07 DIAGNOSIS — O43199 Other malformation of placenta, unspecified trimester: Secondary | ICD-10-CM

## 2022-04-07 DIAGNOSIS — Z3A35 35 weeks gestation of pregnancy: Secondary | ICD-10-CM

## 2022-04-07 DIAGNOSIS — Z98891 History of uterine scar from previous surgery: Secondary | ICD-10-CM

## 2022-04-07 DIAGNOSIS — Z8751 Personal history of pre-term labor: Secondary | ICD-10-CM

## 2022-04-07 DIAGNOSIS — O24319 Unspecified pre-existing diabetes mellitus in pregnancy, unspecified trimester: Secondary | ICD-10-CM

## 2022-04-07 DIAGNOSIS — O0990 Supervision of high risk pregnancy, unspecified, unspecified trimester: Secondary | ICD-10-CM

## 2022-04-07 NOTE — Progress Notes (Signed)
   PRENATAL VISIT NOTE  Subjective:  Kara Dominguez is a 41 y.o. [phone] at [redacted]w[redacted]d being seen today for ongoing prenatal care.  She is currently monitored for the following issues for this high-risk pregnancy and has History of preterm delivery; H/O shoulder dystocia in prior pregnancy, currently pregnant; Seborrheic dermatitis; History of VBAC x 3; Language barrier; Supervision of high risk pregnancy, antepartum; Pre-existing diabetes mellitus affecting pregnancy, antepartum; and Marginal insertion of umbilical cord affecting management of mother on their problem list.  Patient doing well with no acute concerns today. She reports no complaints.  Contractions: Irritability. Vag. Bleeding: None.  Movement: Present. Denies leaking of fluid.   The following portions of the patient's history were reviewed and updated as appropriate: allergies, current medications, past family history, past medical history, past social history, past surgical history and problem list. Problem list updated.  Objective:   Vitals:   04/07/22 0904  BP: 124/77  Pulse: 90  Weight: 220 lb 6.4 oz (100 kg)    Fetal Status: Fetal Heart Rate (bpm): 141 Fundal Height: 37 cm Movement: Present     General:  Alert, oriented and cooperative. Patient is in no acute distress.  Skin: Skin is warm and dry. No rash noted.   Cardiovascular: Normal heart rate noted  Respiratory: Normal respiratory effort, no problems with respiration noted  Abdomen: Soft, gravid, appropriate for gestational age.  Pain/Pressure: Present     Pelvic: Cervical exam deferred        Extremities: Normal range of motion.  Edema: Trace  Mental Status:  Normal mood and affect. Normal behavior. Normal judgment and thought content.   Assessment and Plan:  Pregnancy: [id number] at [redacted]w[redacted]d  1. [redacted] weeks gestation of pregnancy   2. Pre-existing diabetes mellitus affecting pregnancy, antepartum FBS: 80-97 PPBS: 87-43, most in range Last EFW was at 99%,  recheck growth, consider delivery at 38-39 weeks even with good blood sugar control  3. History of preterm delivery   4. H/O shoulder dystocia in prior pregnancy, currently pregnant Recheck growth scan around 04/22/22 due to previous LGA status  5. History of VBAC x 3 Consent on chart , pt desires TOLAC   6. Language barrier Live interpreter present  7. Supervision of high risk pregnancy, antepartum Continue routine prenatal care  8. Marginal insertion of umbilical cord affecting management of mother   Preterm labor symptoms and general obstetric precautions including but not limited to vaginal bleeding, contractions, leaking of fluid and fetal movement were reviewed in detail with the patient.  Please refer to After Visit Summary for other counseling recommendations.   Return in about 1 week (around 04/14/2022) for [facility], in person, 36 weeks swabs.   Mariel Aloe, MD Faculty Attending Center for [facility]

## 2022-04-07 NOTE — Progress Notes (Signed)
Pt has Glucose log today. 

## 2022-04-08 ENCOUNTER — Ambulatory Visit: Payer: Self-pay | Admitting: *Deleted

## 2022-04-08 ENCOUNTER — Ambulatory Visit: Payer: Self-pay | Attending: Obstetrics and Gynecology

## 2022-04-08 VITALS — BP 118/63 | HR 86

## 2022-04-08 DIAGNOSIS — E119 Type 2 diabetes mellitus without complications: Secondary | ICD-10-CM

## 2022-04-08 DIAGNOSIS — O24319 Unspecified pre-existing diabetes mellitus in pregnancy, unspecified trimester: Secondary | ICD-10-CM | POA: Insufficient documentation

## 2022-04-08 DIAGNOSIS — Z3A35 35 weeks gestation of pregnancy: Secondary | ICD-10-CM

## 2022-04-08 DIAGNOSIS — O09523 Supervision of elderly multigravida, third trimester: Secondary | ICD-10-CM

## 2022-04-08 DIAGNOSIS — O09299 Supervision of pregnancy with other poor reproductive or obstetric history, unspecified trimester: Secondary | ICD-10-CM | POA: Insufficient documentation

## 2022-04-08 DIAGNOSIS — O24113 Pre-existing diabetes mellitus, type 2, in pregnancy, third trimester: Secondary | ICD-10-CM

## 2022-04-08 DIAGNOSIS — O99213 Obesity complicating pregnancy, third trimester: Secondary | ICD-10-CM

## 2022-04-08 DIAGNOSIS — O43193 Other malformation of placenta, third trimester: Secondary | ICD-10-CM

## 2022-04-08 DIAGNOSIS — O0943 Supervision of pregnancy with grand multiparity, third trimester: Secondary | ICD-10-CM

## 2022-04-08 DIAGNOSIS — Z7984 Long term (current) use of oral hypoglycemic drugs: Secondary | ICD-10-CM

## 2022-04-08 DIAGNOSIS — Z6833 Body mass index (BMI) 33.0-33.9, adult: Secondary | ICD-10-CM

## 2022-04-08 DIAGNOSIS — Z794 Long term (current) use of insulin: Secondary | ICD-10-CM

## 2022-04-08 DIAGNOSIS — O43199 Other malformation of placenta, unspecified trimester: Secondary | ICD-10-CM

## 2022-04-08 DIAGNOSIS — E669 Obesity, unspecified: Secondary | ICD-10-CM

## 2022-04-08 DIAGNOSIS — O34219 Maternal care for unspecified type scar from previous cesarean delivery: Secondary | ICD-10-CM

## 2022-04-08 DIAGNOSIS — O0990 Supervision of high risk pregnancy, unspecified, unspecified trimester: Secondary | ICD-10-CM | POA: Insufficient documentation

## 2022-04-08 DIAGNOSIS — O35EXX0 Maternal care for other (suspected) fetal abnormality and damage, fetal genitourinary anomalies, not applicable or unspecified: Secondary | ICD-10-CM

## 2022-04-08 NOTE — Procedures (Signed)
Kara Dominguez 1980/12/11 [redacted]w[redacted]d  Fetus A Non-Stress Test Interpretation for 04/08/22  Indication:  type 2 Diabetic -insulin controlled  Fetal Heart Rate A Mode: External Baseline Rate (A): 145 bpm Variability: Moderate Accelerations: 15 x 15 Decelerations: None Multiple birth?: No  Uterine Activity Mode: Toco Contraction Frequency (min): 12 Contraction Duration (sec): 60-90 Contraction Quality: Mild Resting Tone Palpated: Relaxed Resting Time: Adequate  Interpretation (Fetal Testing) Nonstress Test Interpretation: Reactive Overall Impression: Reassuring for gestational age Comments: Tracing reviewed by Dr. Judeth Cornfield

## 2022-04-11 ENCOUNTER — Other Ambulatory Visit: Payer: Self-pay | Admitting: Obstetrics and Gynecology

## 2022-04-11 DIAGNOSIS — O24113 Pre-existing diabetes mellitus, type 2, in pregnancy, third trimester: Secondary | ICD-10-CM

## 2022-04-11 DIAGNOSIS — Z6833 Body mass index (BMI) 33.0-33.9, adult: Secondary | ICD-10-CM

## 2022-04-11 DIAGNOSIS — O43199 Other malformation of placenta, unspecified trimester: Secondary | ICD-10-CM

## 2022-04-11 DIAGNOSIS — O35EXX0 Maternal care for other (suspected) fetal abnormality and damage, fetal genitourinary anomalies, not applicable or unspecified: Secondary | ICD-10-CM

## 2022-04-11 DIAGNOSIS — O09523 Supervision of elderly multigravida, third trimester: Secondary | ICD-10-CM

## 2022-04-14 ENCOUNTER — Other Ambulatory Visit: Payer: Self-pay

## 2022-04-14 ENCOUNTER — Other Ambulatory Visit (HOSPITAL_COMMUNITY)
Admission: RE | Admit: 2022-04-14 | Discharge: 2022-04-14 | Disposition: A | Discharge status: Home / Self Care | Payer: Medicaid Other | Source: Ambulatory Visit | Attending: Family Medicine | Admitting: Family Medicine

## 2022-04-14 ENCOUNTER — Ambulatory Visit (INDEPENDENT_AMBULATORY_CARE_PROVIDER_SITE_OTHER): Payer: Self-pay | Admitting: Family Medicine

## 2022-04-14 VITALS — BP 118/76 | HR 99 | Wt 220.1 lb

## 2022-04-14 DIAGNOSIS — O24319 Unspecified pre-existing diabetes mellitus in pregnancy, unspecified trimester: Secondary | ICD-10-CM

## 2022-04-14 DIAGNOSIS — O0990 Supervision of high risk pregnancy, unspecified, unspecified trimester: Secondary | ICD-10-CM

## 2022-04-14 DIAGNOSIS — Z3A36 36 weeks gestation of pregnancy: Secondary | ICD-10-CM

## 2022-04-14 DIAGNOSIS — Z789 Other specified health status: Secondary | ICD-10-CM

## 2022-04-14 DIAGNOSIS — O09299 Supervision of pregnancy with other poor reproductive or obstetric history, unspecified trimester: Secondary | ICD-10-CM

## 2022-04-14 DIAGNOSIS — Z98891 History of uterine scar from previous surgery: Secondary | ICD-10-CM

## 2022-04-14 DIAGNOSIS — O09293 Supervision of pregnancy with other poor reproductive or obstetric history, third trimester: Secondary | ICD-10-CM

## 2022-04-14 DIAGNOSIS — O24313 Unspecified pre-existing diabetes mellitus in pregnancy, third trimester: Secondary | ICD-10-CM

## 2022-04-14 NOTE — Patient Instructions (Signed)
 Tercer trimestre de embarazo Third Trimester of Pregnancy  El tercer trimestre de embarazo va desde la semana 28 hasta la semana 40. Esto corresponde a los meses 7 a 9. El tercer trimestre es un perodo en el que el beb en gestacin (feto) crece rpidamente. Hacia el final del noveno mes, el feto mide alrededor de 20 pulgadas (45 cm) de largo y pesa entre 6 y 10 libras (2.7 y 4.5 kg). Cambios en el cuerpo durante el tercer trimestre Durante el tercer trimestre, su cuerpo contina experimentando numerosos cambios. Los cambios varan y generalmente vuelven a la normalidad despus del nacimiento del beb. Cambios fsicos Seguir aumentando de peso. Es de esperar que aumente entre 25 y 35 libras (11 y 16 kg) hacia el final del embarazo si inicia el embarazo con un peso normal. Si tiene bajo peso, es de esperar que aumente entre 28 y 40 libras (13 y 18 kg), y si tiene sobrepeso, es de esperar que aumente entre 15 y 25 libras (7 y 11 kg). Podrn aparecer las primeras estras en las caderas, el abdomen y las mamas. Las mamas seguirn creciendo y pueden doler. Un lquido amarillo (calostro) puede salir de sus pechos. Esta es la primera leche que usted produce para su beb. Tal vez haya cambios en el cabello. Esto cambios pueden incluir su engrosamiento, crecimiento rpido y cambios en la textura. A algunas personas tambin se les cae el cabello durante o despus del embarazo, o tienen el cabello seco o fino. El ombligo puede salir hacia afuera. Puede observar que se le hinchan las manos, el rostro o los tobillos. Cambios en la salud Es posible que tenga acidez estomacal. Puede sufrir estreimiento. Puede desarrollar hemorroides. Puede desarrollar venas hinchadas y abultadas (venas varicosas) en las piernas. Puede presentar ms dolor en la pelvis, la espalda o los muslos. Esto se debe al aumento de peso y al aumento de las hormonas que relajan las articulaciones. Puede presentar un aumento del  hormigueo o entumecimiento en las manos, brazos y piernas. La piel de su abdomen tambin puede sentirse entumecida. Puede sentir que le falta el aire debido a que se expande el tero. Otros cambios Puede tener necesidad de orinar con ms frecuencia porque el feto baja hacia la pelvis y ejerce presin sobre la vejiga. Puede tener ms problemas para dormir. Esto puede deberse al tamao de su abdomen, una mayor necesidad de orinar y un aumento en el metabolismo de su cuerpo. Puede notar que el feto "baja" o lo siente ms bajo, en el abdomen (aligeramiento). Puede tener un aumento de la secrecin vaginal. Puede notar que tiene dolor alrededor del hueso plvico a medida que el tero se distiende. Siga estas instrucciones en su casa: Medicamentos Siga las instrucciones del mdico en relacin con el uso de medicamentos. Durante el embarazo, hay medicamentos que pueden tomarse y otros que no. No tome ningn medicamento a menos que lo haya autorizado el mdico. Tome vitaminas prenatales que contengan por lo menos 600 microgramos (mcg) de cido flico. Comida y bebida Lleve una dieta saludable que incluya frutas y verduras frescas, cereales integrales, buenas fuentes de protenas como carnes magras, huevos o tofu, y productos lcteos descremados. Evite la carne cruda y el jugo, la leche y el queso sin pasteurizar. Estos portan grmenes que pueden provocar dao tanto a usted como al beb. Tome 4 o 5 comidas pequeas en lugar de 3 comidas abundantes al da. Es posible que tenga que tomar estas medidas para prevenir o tratar   el estreimiento: Beber suficiente lquido como para mantener la orina de color amarillo plido. Consumir alimentos ricos en fibra, como frijoles, cereales integrales, y frutas y verduras frescas. Limitar el consumo de alimentos ricos en grasa y azcares procesados, como los alimentos fritos o dulces. Actividad Haga ejercicio solamente como se lo haya indicado el mdico. La mayora de  las personas pueden continuar su actividad fsica habitual durante el embarazo. Intente realizar como mnimo 30 minutos de actividad fsica por lo menos 5 das a la semana. Deje de hacer ejercicio si experimenta contracciones en el tero. Deje de hacer ejercicio si le aparecen dolor o clicos en la parte baja del vientre o de la espalda. Evite levantar pesos excesivos. No haga ejercicio si hace mucho calor o humedad, o si se encuentra a una altitud elevada. Si lo desea, puede seguir teniendo relaciones sexuales, salvo que el mdico le indique lo contrario. Alivio del dolor y del malestar Haga pausas frecuentes y descanse con las piernas levantadas (elevadas) si tiene calambres en las piernas o dolor en la parte baja de la espalda. Dese baos de asiento con agua tibia para aliviar el dolor o las molestias causadas por las hemorroides. Use una crema para las hemorroides si el mdico la autoriza. Use un sujetador que le brinde buen soporte para prevenir las molestias causadas por la sensibilidad en las mamas. Si tiene venas varicosas: Use medias de compresin como se lo haya indicado el mdico. Eleve los pies durante 15 minutos, 3 o 4 veces por da. Limite el consumo de sal en su dieta. Seguridad Hable con su mdico antes de viajar distancias largas. No se d baos de inmersin en agua caliente, baos turcos ni saunas. Use el cinturn de seguridad en todo momento mientras conduce o va en auto. Hable con el mdico si es vctima de maltrato verbal o fsico. Preparacin para el nacimiento Para prepararse para la llegada de su beb: Tome clases prenatales para entender, practicar, y hacer preguntas sobre el trabajo de parto y el parto. Visite el hospital y recorra el rea de maternidad. Compre un asiento de seguridad orientado hacia atrs, y asegrese de saber cmo instalarlo en su automvil. Prepare la habitacin o el lugar donde dormir el beb. Asegrese de quitar todas las almohadas y animales de  peluche de la cuna del beb para evitar la asfixia. Indicaciones generales Evite el contacto con las bandejas sanitarias de los gatos y la tierra que estos animales usan. Estos alimentos contienen bacterias que pueden causar defectos congnitos en el beb. Si tiene un gato, pdale a alguien que limpie la caja de arena por usted. No se haga lavados vaginales ni use tampones. No use toallas higinicas perfumadas. No consuma ningn producto que contenga nicotina o tabaco, como cigarrillos, cigarrillos electrnicos y tabaco de mascar. Si necesita ayuda para dejar de consumir estos productos, consulte al mdico. No use ningn remedio a base de hierbas, drogas ilegales o medicamentos que no le hayan sido recetados. Las sustancias qumicas de estos productos pueden daar al beb. No beba alcohol. Le realizarn exmenes prenatales ms frecuentes durante el tercer trimestre. Durante una visita prenatal de rutina, el mdico le har un examen fsico, le realizar pruebas y hablar con usted de su salud general. Cumpla con todas las visitas de seguimiento. Esto es importante. Dnde buscar ms informacin American Pregnancy Association (Asociacin Estadounidense del Embarazo): americanpregnancy.org American College of Obstetricians and Gynecologists (Colegio Estadounidense de Obstetras y Gineclogos): acog.org/womens-health/pregnancy? Office on Women's Health (Oficina para la Salud de   la Mujer): womenshealth.gov/pregnancy Comunquese con un mdico si tiene: Fiebre. Clicos leves en la pelvis, presin en la pelvis o dolor persistente en la zona abdominal o la parte baja de la espalda. Vmitos o diarrea. Secrecin vaginal con mal olor u orina con mal olor. Dolor al orinar. Un dolor de cabeza que no desaparece despus de tomar analgsicos. Cambios en la visin o ve manchas delante de los ojos. Solicite ayuda de inmediato si: Rompe la bolsa. Tiene contracciones regulares separadas por menos de 5 minutos. Tiene  sangrado o pequeas prdidas vaginales. Siente un dolor abdominal intenso. Tiene dificultad para respirar. Siente dolor en el pecho. Sufre episodios de desmayo. No ha sentido a su beb moverse durante el perodo de tiempo que le indic el mdico. Tiene dolor, hinchazn o enrojecimiento nuevos en un brazo o una pierna o se produce un aumento de alguno de estos sntomas. Resumen El tercer trimestre del embarazo comprende desde la semana 28 hasta la semana 40 (desde el mes 7 hasta el mes 9). Puede tener ms problemas para dormir. Esto puede deberse al tamao de su abdomen, una mayor necesidad de orinar y un aumento en el metabolismo de su cuerpo. Le realizarn exmenes prenatales ms frecuentes durante el tercer trimestre. Cumpla con todas las visitas de seguimiento. Esto es importante. Esta informacin no tiene como fin reemplazar el consejo del mdico. Asegrese de hacerle al mdico cualquier pregunta que tenga. Document Revised: 03/06/2020 Document Reviewed: 03/06/2020 Elsevier Patient Education  2023 Elsevier Inc.  

## 2022-04-14 NOTE — Progress Notes (Signed)
   PRENATAL VISIT NOTE  Subjective:  Kara Dominguez is a 41 y.o. [phone] at [redacted]w[redacted]d being seen today for ongoing prenatal care.  She is currently monitored for the following issues for this high-risk pregnancy and has History of preterm delivery; H/O shoulder dystocia in prior pregnancy, currently pregnant; Seborrheic dermatitis; History of VBAC x 3; Language barrier; Supervision of high risk pregnancy, antepartum; Pre-existing diabetes mellitus affecting pregnancy, antepartum; and Marginal insertion of umbilical cord affecting management of mother on their problem list.  Patient reports no complaints.  Contractions: Not present. Vag. Bleeding: None.  Movement: Present. Denies leaking of fluid.   The following portions of the patient's history were reviewed and updated as appropriate: allergies, current medications, past family history, past medical history, past social history, past surgical history and problem list.   Objective:   Vitals:   04/14/22 1522  BP: 118/76  Pulse: 99  Weight: 220 lb 1.6 oz (99.8 kg)    Fetal Status: Fetal Heart Rate (bpm): 145 Fundal Height: 38 cm Movement: Present  Presentation: Vertex  General:  Alert, oriented and cooperative. Patient is in no acute distress.  Skin: Skin is warm and dry. No rash noted.   Cardiovascular: Normal heart rate noted  Respiratory: Normal respiratory effort, no problems with respiration noted  Abdomen: Soft, gravid, appropriate for gestational age.  Pain/Pressure: Present     Pelvic: Cervical exam performed in the presence of a chaperone Dilation: 1.5 Effacement (%): Thick Station: -3, -2  Extremities: Normal range of motion.  Edema: None  Mental Status: Normal mood and affect. Normal behavior. Normal judgment and thought content.   Assessment and Plan:  Pregnancy: [id number] at [redacted]w[redacted]d 1. Supervision of high risk pregnancy, antepartum Cultures today - GC/Chlamydia probe amp ([place])not at [facility] - Culture, beta strep  (group b only)  2. Pre-existing diabetes mellitus affecting pregnancy, antepartum  CBGs are not all in range and EFW is > 99%. Has h/o SD with > 9 lb baby--will move to IOL at 37 weeks (EFW about 8 lb estimated from u/s on 7/14) In testing with MFM Continue insulin and metformin  3. Language barrier Spanish interpreter: Eda used   4. History of VBAC x 3 Desires again-consent signed  5. H/O shoulder dystocia in prior pregnancy, currently pregnant Discussed and will move to IOL  Preterm labor symptoms and general obstetric precautions including but not limited to vaginal bleeding, contractions, leaking of fluid and fetal movement were reviewed in detail with the patient. Please refer to After Visit Summary for other counseling recommendations.   Return in 1 week (on 04/21/2022).  Future Appointments  Date Time Provider Department Center  04/15/2022  3:00 PM [facility] NURSE [facility]  04/15/2022  3:15 PM [facility] NST [facility]  04/15/2022  3:45 PM [facility] US1 [facility]  04/22/2022  9:30 AM [facility] NURSE [facility]  04/22/2022  9:45 AM [facility] US7 [facility]    Reva Bores, MD

## 2022-04-15 ENCOUNTER — Other Ambulatory Visit: Payer: Self-pay | Admitting: Obstetrics and Gynecology

## 2022-04-15 ENCOUNTER — Other Ambulatory Visit: Payer: Self-pay

## 2022-04-15 ENCOUNTER — Encounter (HOSPITAL_COMMUNITY): Payer: Self-pay | Admitting: Family Medicine

## 2022-04-15 ENCOUNTER — Ambulatory Visit: Payer: Self-pay | Admitting: *Deleted

## 2022-04-15 ENCOUNTER — Inpatient Hospital Stay (HOSPITAL_COMMUNITY)
Admission: AD | Admit: 2022-04-15 | Discharge: 2022-04-18 | DRG: 805 | Disposition: A | Discharge status: Home / Self Care | Payer: Medicaid Other | Attending: Obstetrics & Gynecology | Admitting: Obstetrics & Gynecology

## 2022-04-15 ENCOUNTER — Telehealth: Payer: Self-pay

## 2022-04-15 ENCOUNTER — Ambulatory Visit: Payer: Medicaid Other | Attending: Obstetrics & Gynecology

## 2022-04-15 VITALS — BP 114/69 | HR 79

## 2022-04-15 DIAGNOSIS — O4103X0 Oligohydramnios, third trimester, not applicable or unspecified: Secondary | ICD-10-CM | POA: Diagnosis present

## 2022-04-15 DIAGNOSIS — E669 Obesity, unspecified: Secondary | ICD-10-CM

## 2022-04-15 DIAGNOSIS — O34219 Maternal care for unspecified type scar from previous cesarean delivery: Secondary | ICD-10-CM

## 2022-04-15 DIAGNOSIS — O24419 Gestational diabetes mellitus in pregnancy, unspecified control: Secondary | ICD-10-CM | POA: Diagnosis present

## 2022-04-15 DIAGNOSIS — Z794 Long term (current) use of insulin: Secondary | ICD-10-CM

## 2022-04-15 DIAGNOSIS — O24319 Unspecified pre-existing diabetes mellitus in pregnancy, unspecified trimester: Secondary | ICD-10-CM

## 2022-04-15 DIAGNOSIS — E119 Type 2 diabetes mellitus without complications: Secondary | ICD-10-CM

## 2022-04-15 DIAGNOSIS — O3663X0 Maternal care for excessive fetal growth, third trimester, not applicable or unspecified: Secondary | ICD-10-CM

## 2022-04-15 DIAGNOSIS — O165 Unspecified maternal hypertension, complicating the puerperium: Principal | ICD-10-CM

## 2022-04-15 DIAGNOSIS — Z7984 Long term (current) use of oral hypoglycemic drugs: Secondary | ICD-10-CM | POA: Diagnosis not present

## 2022-04-15 DIAGNOSIS — Z3A36 36 weeks gestation of pregnancy: Secondary | ICD-10-CM

## 2022-04-15 DIAGNOSIS — E1165 Type 2 diabetes mellitus with hyperglycemia: Secondary | ICD-10-CM | POA: Diagnosis present

## 2022-04-15 DIAGNOSIS — O368390 Maternal care for abnormalities of the fetal heart rate or rhythm, unspecified trimester, not applicable or unspecified: Secondary | ICD-10-CM | POA: Insufficient documentation

## 2022-04-15 DIAGNOSIS — O0943 Supervision of pregnancy with grand multiparity, third trimester: Secondary | ICD-10-CM

## 2022-04-15 DIAGNOSIS — O34211 Maternal care for low transverse scar from previous cesarean delivery: Secondary | ICD-10-CM | POA: Diagnosis not present

## 2022-04-15 DIAGNOSIS — O2412 Pre-existing diabetes mellitus, type 2, in childbirth: Secondary | ICD-10-CM | POA: Diagnosis present

## 2022-04-15 DIAGNOSIS — O43199 Other malformation of placenta, unspecified trimester: Secondary | ICD-10-CM

## 2022-04-15 DIAGNOSIS — O4103X1 Oligohydramnios, third trimester, fetus 1: Secondary | ICD-10-CM | POA: Diagnosis not present

## 2022-04-15 DIAGNOSIS — O09523 Supervision of elderly multigravida, third trimester: Secondary | ICD-10-CM | POA: Insufficient documentation

## 2022-04-15 DIAGNOSIS — O0990 Supervision of high risk pregnancy, unspecified, unspecified trimester: Secondary | ICD-10-CM | POA: Insufficient documentation

## 2022-04-15 DIAGNOSIS — O24113 Pre-existing diabetes mellitus, type 2, in pregnancy, third trimester: Secondary | ICD-10-CM | POA: Insufficient documentation

## 2022-04-15 DIAGNOSIS — O43193 Other malformation of placenta, third trimester: Secondary | ICD-10-CM

## 2022-04-15 DIAGNOSIS — O99213 Obesity complicating pregnancy, third trimester: Secondary | ICD-10-CM

## 2022-04-15 DIAGNOSIS — O43123 Velamentous insertion of umbilical cord, third trimester: Secondary | ICD-10-CM | POA: Diagnosis present

## 2022-04-15 DIAGNOSIS — O09299 Supervision of pregnancy with other poor reproductive or obstetric history, unspecified trimester: Secondary | ICD-10-CM

## 2022-04-15 DIAGNOSIS — Z6833 Body mass index (BMI) 33.0-33.9, adult: Secondary | ICD-10-CM | POA: Insufficient documentation

## 2022-04-15 DIAGNOSIS — O134 Gestational [pregnancy-induced] hypertension without significant proteinuria, complicating childbirth: Secondary | ICD-10-CM | POA: Diagnosis present

## 2022-04-15 DIAGNOSIS — O35EXX0 Maternal care for other (suspected) fetal abnormality and damage, fetal genitourinary anomalies, not applicable or unspecified: Secondary | ICD-10-CM

## 2022-04-15 LAB — CBC
HCT: 36.6 % (ref 36.0–46.0)
Hemoglobin: 12.3 g/dL (ref 12.0–15.0)
MCH: 27.9 pg (ref 26.0–34.0)
MCHC: 33.6 g/dL (ref 30.0–36.0)
MCV: 83 fL (ref 80.0–100.0)
Platelets: 236 10*3/uL (ref 150–400)
RBC: 4.41 MIL/uL (ref 3.87–5.11)
RDW: 14.5 % (ref 11.5–15.5)
WBC: 8 10*3/uL (ref 4.0–10.5)
nRBC: 0 % (ref 0.0–0.2)

## 2022-04-15 LAB — COMPREHENSIVE METABOLIC PANEL
ALT: 40 U/L (ref 0–44)
AST: 52 U/L — ABNORMAL HIGH (ref 15–41)
Albumin: 2.9 g/dL — ABNORMAL LOW (ref 3.5–5.0)
Alkaline Phosphatase: 255 U/L — ABNORMAL HIGH (ref 38–126)
Anion gap: 9 (ref 5–15)
BUN: 5 mg/dL — ABNORMAL LOW (ref 6–20)
CO2: 21 mmol/L — ABNORMAL LOW (ref 22–32)
Calcium: 9 mg/dL (ref 8.9–10.3)
Chloride: 106 mmol/L (ref 98–111)
Creatinine, Ser: 0.56 mg/dL (ref 0.44–1.00)
GFR, Estimated: >60 mL/min (ref >60)
Glucose, Bld: 85 mg/dL (ref 70–99)
Potassium: 4.2 mmol/L (ref 3.5–5.1)
Sodium: 136 mmol/L (ref 135–145)
Total Bilirubin: 0.9 mg/dL (ref 0.3–1.2)
Total Protein: 6.6 g/dL (ref 6.5–8.1)

## 2022-04-15 LAB — PROTEIN / CREATININE RATIO, URINE
Creatinine, Urine: 32 mg/dL
Total Protein, Urine: <6 mg/dL

## 2022-04-15 LAB — GLUCOSE, CAPILLARY
Glucose-Capillary: 78 mg/dL (ref 70–99)
Glucose-Capillary: 73 mg/dL (ref 70–99)

## 2022-04-15 LAB — TYPE AND SCREEN
ABO/RH(D): O POS
Antibody Screen: NEGATIVE

## 2022-04-15 MED ORDER — LACTATED RINGERS IV SOLN: INTRAVENOUS | Status: DC

## 2022-04-15 MED ORDER — METFORMIN HCL 500 MG PO TABS
1000.0000 mg | ORAL_TABLET | Freq: Every day | ORAL | Status: DC
  Administered 2022-04-16 – 2022-04-18 (×3): 1000 mg via ORAL
  Filled 2022-04-15 (×4): qty 2

## 2022-04-15 MED ORDER — PENICILLIN G POT IN DEXTROSE 60000 UNIT/ML IV SOLN
3.0000 10*6.[IU] | INTRAVENOUS | Status: DC
  Administered 2022-04-16: 3 10*6.[IU] via INTRAVENOUS
  Filled 2022-04-15 (×2): qty 50

## 2022-04-15 MED ORDER — INSULIN NPH (HUMAN) (ISOPHANE) 100 UNIT/ML ~~LOC~~ SUSP
12.0000 [IU] | Freq: Every day | SUBCUTANEOUS | Status: DC
  Administered 2022-04-15: 12 [IU] via SUBCUTANEOUS
  Filled 2022-04-15: qty 10

## 2022-04-15 MED ORDER — PHENYLEPHRINE 80 MCG/ML (10ML) SYRINGE FOR IV PUSH (FOR BLOOD PRESSURE SUPPORT): 80.0000 ug | PREFILLED_SYRINGE | INTRAVENOUS | Status: DC | PRN

## 2022-04-15 MED ORDER — PHENYLEPHRINE 80 MCG/ML (10ML) SYRINGE FOR IV PUSH (FOR BLOOD PRESSURE SUPPORT)
80.0000 ug | PREFILLED_SYRINGE | INTRAVENOUS | Status: DC | PRN
Start: 2022-04-15 — End: 2022-04-16

## 2022-04-15 MED ORDER — OXYCODONE-ACETAMINOPHEN 5-325 MG PO TABS: 1.0000 | ORAL_TABLET | ORAL | Status: DC | PRN

## 2022-04-15 MED ORDER — LIDOCAINE HCL (PF) 1 % IJ SOLN
30.0000 mL | INTRAMUSCULAR | Status: DC | PRN
Start: 2022-04-15 — End: 2022-04-16

## 2022-04-15 MED ORDER — EPHEDRINE 5 MG/ML INJ
10.0000 mg | INTRAVENOUS | Status: DC | PRN
Start: 2022-04-15 — End: 2022-04-16

## 2022-04-15 MED ORDER — ONDANSETRON HCL 4 MG/2ML IJ SOLN: 4.0000 mg | Freq: Four times a day (QID) | INTRAMUSCULAR | Status: DC | PRN

## 2022-04-15 MED ORDER — OXYTOCIN-SODIUM CHLORIDE 30-0.9 UT/500ML-% IV SOLN
1.0000 m[IU]/min | INTRAVENOUS | Status: DC
  Administered 2022-04-15: 2 m[IU]/min via INTRAVENOUS
  Filled 2022-04-15: qty 500

## 2022-04-15 MED ORDER — ACETAMINOPHEN 325 MG PO TABS: 650.0000 mg | ORAL_TABLET | ORAL | Status: DC | PRN

## 2022-04-15 MED ORDER — METFORMIN HCL 500 MG PO TABS
1000.0000 mg | ORAL_TABLET | Freq: Two times a day (BID) | ORAL | Status: DC
  Filled 2022-04-15: qty 2

## 2022-04-15 MED ORDER — SODIUM CHLORIDE 0.9 % IV SOLN
5.0000 10*6.[IU] | Freq: Once | INTRAVENOUS | Status: AC
  Administered 2022-04-15: 5 10*6.[IU] via INTRAVENOUS
  Filled 2022-04-15: qty 5

## 2022-04-15 MED ORDER — OXYTOCIN BOLUS FROM INFUSION
333.0000 mL | Freq: Once | INTRAVENOUS | Status: AC
  Administered 2022-04-16: 333 mL via INTRAVENOUS

## 2022-04-15 MED ORDER — METFORMIN HCL 500 MG PO TABS
500.0000 mg | ORAL_TABLET | Freq: Every day | ORAL | Status: DC
  Administered 2022-04-16 – 2022-04-17 (×2): 500 mg via ORAL
  Filled 2022-04-15 (×2): qty 1

## 2022-04-15 MED ORDER — TERBUTALINE SULFATE 1 MG/ML IJ SOLN
0.2500 mg | Freq: Once | INTRAMUSCULAR | Status: DC | PRN
Start: 2022-04-15 — End: 2022-04-16

## 2022-04-15 MED ORDER — EPHEDRINE 5 MG/ML INJ: 10.0000 mg | INTRAVENOUS | Status: DC | PRN

## 2022-04-15 MED ORDER — OXYTOCIN-SODIUM CHLORIDE 30-0.9 UT/500ML-% IV SOLN
2.5000 [IU]/h | INTRAVENOUS | Status: DC
  Administered 2022-04-16: 2.5 [IU]/h via INTRAVENOUS

## 2022-04-15 MED ORDER — OXYCODONE-ACETAMINOPHEN 5-325 MG PO TABS: 2.0000 | ORAL_TABLET | ORAL | Status: DC | PRN

## 2022-04-15 MED ORDER — SOD CITRATE-CITRIC ACID 500-334 MG/5ML PO SOLN: 30.0000 mL | ORAL | Status: DC | PRN

## 2022-04-15 MED ORDER — DIPHENHYDRAMINE HCL 50 MG/ML IJ SOLN: 12.5000 mg | INTRAMUSCULAR | Status: DC | PRN

## 2022-04-15 MED ORDER — LACTATED RINGERS IV SOLN: 500.0000 mL | INTRAVENOUS | Status: DC | PRN

## 2022-04-15 MED ORDER — FENTANYL-BUPIVACAINE-NACL 0.5-0.125-0.9 MG/250ML-% EP SOLN
12.0000 mL/h | EPIDURAL | Status: DC | PRN
  Administered 2022-04-16: 12 mL/h via EPIDURAL
  Filled 2022-04-15: qty 250

## 2022-04-15 MED ORDER — LACTATED RINGERS IV SOLN
500.0000 mL | Freq: Once | INTRAVENOUS | Status: AC
  Administered 2022-04-16: 500 mL via INTRAVENOUS

## 2022-04-15 MED ORDER — FLEET ENEMA 7-19 GM/118ML RE ENEM: 1.0000 | ENEMA | RECTAL | Status: DC | PRN

## 2022-04-15 NOTE — Procedures (Signed)
Kara Dominguez 30-Jan-1981 [redacted]w[redacted]d  Fetus A Non-Stress Test Interpretation for 04/15/22  Indication: Advanced Maternal Age >40 years, abnormal fetal heart rate, MCI, T2DM  Fetal Heart Rate A Mode: External Baseline Rate (A): 140 bpm Variability: Moderate Accelerations: 15 x 15 Decelerations: None Multiple birth?: No  Uterine Activity Mode: Palpation, Toco Contraction Frequency (min): none Resting Tone Palpated: Relaxed  Interpretation (Fetal Testing) Nonstress Test Interpretation: Reactive Overall Impression: Reassuring for gestational age Comments: Dr. Parke Poisson reviewed tracing

## 2022-04-15 NOTE — H&P (Signed)
OBSTETRIC ADMISSION HISTORY AND PHYSICAL  Kara Dominguez is a 41 y.o. female [phone] with IUP at [redacted]w[redacted]d presenting for Oligohydramnios and uncontrolled T2DM. She reports +FMs. No LOF, VB, blurry vision, headaches, peripheral edema, or RUQ pain. She plans on breastfeeding. She requests Depo for birth control.  Dating: By Metta Clines [place] --->  Estimated Date of Delivery: 05/10/22  Sono:    @[redacted]w[redacted]d , normal anatomy, cephalic presentation, 2880g, 10-09-1978, EFW 6'6  Prenatal History/Complications: -T2DM on Insulin and Metformin -previous CS -previous VBAC x3 -hx SD -hx PTB @33  wks -marginal cord insertion -language barrier  Past Medical History: Past Medical History:  Diagnosis Date   Anemia    Gestational diabetes    Obesity in pregnancy 05/04/2018   Preterm labor    Type 2 diabetes mellitus during pregnancy, antepartum 04/06/2018   Current Diabetic Medications:  Metformin and Insulin  [x]  Aspirin 81 mg daily after 12 weeks (? A2/B GDM)  For A2/B GDM or higher classes of DM [x]  Diabetes Education and Testing Supplies [x]  Nutrition Counsult [x]  Fetal ECHO after 20 weeks  [ ]  Eye exam for retina evaluation   Baseline and surveillance labs (pulled in from 05/06/2018, refresh links as needed)  Lab Results  Component Value Date   CREATI    Past Surgical History: Past Surgical History:  Procedure Laterality Date   CESAREAN SECTION      Obstetrical History: OB History     Gravida  6   Para  5   Term  4   Preterm  1   AB  0   Living  5      SAB  0   IAB      Ectopic      Multiple  0   Live Births  5           Social History: Social History   Socioeconomic History   Marital status: Single    Spouse name: Not on file   Number of children: 5   Years of education: Not on file   Highest education level: Not on file  Occupational History   Not on file  Tobacco Use   Smoking status: Never   Smokeless tobacco: Never  Vaping Use   Vaping Use: Never used  Substance and  Sexual Activity   Alcohol use: No   Drug use: No   Sexual activity: Yes    Birth control/protection: None  Other Topics Concern   Not on file  Social History Narrative   Not on file   Social Determinants of Health   Financial Resource Strain: Low Risk  (06/29/2018)   Overall Financial Resource Strain (CARDIA)    Difficulty of Paying Living Expenses: Not hard at all  Food Insecurity: No Food Insecurity (04/14/2022)   Hunger Vital Sign    Worried About Running Out of Food in the Last Year: Never true    Ran Out of Food in the Last Year: Never true  Transportation Needs: No Transportation Needs (04/14/2022)   PRAPARE - (Medical): No    Lack of Transportation (Non-Medical): No  Physical Activity: Inactive (06/29/2018)   Exercise Vital Sign    Days of Exercise per Week: 0 days    Minutes of Exercise per Session: 0 min  Stress: No Stress Concern Present (06/29/2018)   [employer] of Occupational Health - Occupational Stress Questionnaire    Feeling of Stress : Not at all  Social Connections: Socially Integrated (06/29/2018)  Social Advertising account executive [NHANES]    Frequency of Communication with Friends and Family: More than three times a week    Frequency of Social Gatherings with Friends and Family: Once a week    Attends Religious Services: More than 4 times per year    Active Member of [employer] or Organizations: Yes    Attends Banker Meetings: Never    Marital Status: Living with partner    Family History: Family History  Problem Relation Age of Onset   Asthma Neg Hx    Cancer Neg Hx    Diabetes Neg Hx    Heart disease Neg Hx    Hypertension Neg Hx    Stroke Neg Hx     Allergies: No Known Allergies  Medications Prior to Admission  Medication Sig Dispense Refill Last Dose   aspirin EC 81 MG tablet Take 1 tablet (81 mg total) by mouth daily. Swallow whole. 60 tablet 1 04/14/2022   insulin NPH Human  (NOVOLIN N) 100 UNIT/ML injection 14 units with breakfast and 10 units right before bed 10 mL 3 04/15/2022   Insulin Syringe-Needle U-100 (INSULIN SYRINGE .5CC/31GX5/16") 31G X 5/16" 0.5 ML MISC 1 Syringe by Does not apply route in the morning and at bedtime. Syringe to administer insulin to be taken with breakfast and at bedtime. 60 each 6 04/15/2022   metFORMIN (GLUCOPHAGE) 500 MG tablet Take 2 tablets (1,000 mg total) by mouth 2 (two) times daily with a meal. With breakfast and dinner 60 tablet 5 04/15/2022   prenatal vitamin w/FE, FA (PRENATAL 1 + 1) 27-1 MG TABS tablet Take 1 tablet by mouth daily at 12 noon. 30 tablet 11 04/14/2022   Review of Systems:  All systems reviewed and negative except as stated in HPI  PE: Blood pressure (!) 147/90, pulse 100, temperature 98.7 F (37.1 C), temperature source Oral, resp. rate 18, height 5\' 6"  (1.676 m), weight 99.3 kg, last menstrual period 07/17/2021, unknown if currently breastfeeding. General appearance: alert, cooperative, and no distress Lungs: regular rate and effort Heart: regular rate  Abdomen: soft, non-tender Extremities: Homans sign is negative, no sign of DVT Presentation: cephalic EFM: 150 bpm, mod variability, + accels, no decels Toco: 2-4 Dilation: 3 Effacement (%): 60 Station: -2 Exam by:: [email or web address] CNM  Prenatal labs: ABO, Rh: --/--/PENDING (08/04 2103) Antibody: PENDING (08/04 2103) Rubella: 6.82 (02/23 1621) RPR: Non Reactive (06/21 1152)  HBsAg: Negative (02/23 1621)  HIV: Non Reactive (06/21 1152)  GBS:  pending 2 hr GTT n/a  Prenatal Transfer Tool  Maternal Diabetes: Yes:  Diabetes Type:  Pre-pregnancy, Insulin/Medication controlled Genetic Screening: Declined Maternal Ultrasounds/Referrals: Other:LGA, marginal cord insertion, left UTD Fetal Ultrasounds or other Referrals:  Fetal echo, Referred to Materal Fetal Medicine  Maternal Substance Abuse:  No Significant Maternal Medications:  Meds include: Other:  metformin, Insulin Significant Maternal Lab Results: None  Results for orders placed or performed during the hospital encounter of 04/15/22 (from the past 24 hour(s))  CBC   Collection Time: 04/15/22  9:03 PM  Result Value Ref Range   WBC 8.0 4.0 - 10.5 K/uL   RBC 4.41 3.87 - 5.11 MIL/uL   Hemoglobin 12.3 12.0 - 15.0 g/dL   HCT 06/15/22 15.1 - 76.1 %   MCV 83.0 80.0 - 100.0 fL   MCH 27.9 26.0 - 34.0 pg   MCHC 33.6 30.0 - 36.0 g/dL   RDW 60.7 37.1 - 06.2 %   Platelets 236 150 - 400 K/uL  nRBC 0.0 0.0 - 0.2 %  Type and screen   Collection Time: 04/15/22  9:03 PM  Result Value Ref Range   ABO/RH(D) PENDING    Antibody Screen PENDING    Sample Expiration      04/18/2022,2359 Performed at [facility], [address] [place], [place] [address]     Patient Active Problem List   Diagnosis Date Noted   Gestational diabetes 04/15/2022   Marginal insertion of umbilical cord affecting management of mother 12/06/2021   Pre-existing diabetes mellitus affecting pregnancy, antepartum 10/20/2021   Supervision of high risk pregnancy, antepartum 10/18/2021   Language barrier 06/08/2018   History of VBAC x 3 02/01/2018   Seborrheic dermatitis 01/03/2017   H/O shoulder dystocia in prior pregnancy, currently pregnant 10/27/2014   History of preterm delivery 06/10/2014    Assessment: Kara Dominguez is a 41 y.o. [id number] at [redacted]w[redacted]d here for IOL for Oligo and uncontrolled T2DM  1. Labor: latent 2. FWB: Cat I 3. Pain: analgesia/anesthesia prn 4. GBS: unknown 5. T2DM: continue Insulin and Metformin, consider endotool 6. Elevated BP 7. TOLAC: consent signed  Plan: Admit to LD PCN IOL with Pitocin PEC labs Anticipate VBAC   Donette Larry, CNM  04/15/2022, 9:43 PM

## 2022-04-16 ENCOUNTER — Inpatient Hospital Stay (HOSPITAL_COMMUNITY): Payer: Medicaid Other | Admitting: Anesthesiology

## 2022-04-16 ENCOUNTER — Encounter (HOSPITAL_COMMUNITY): Payer: Self-pay | Admitting: Family Medicine

## 2022-04-16 DIAGNOSIS — O34219 Maternal care for unspecified type scar from previous cesarean delivery: Secondary | ICD-10-CM

## 2022-04-16 DIAGNOSIS — O2412 Pre-existing diabetes mellitus, type 2, in childbirth: Secondary | ICD-10-CM

## 2022-04-16 DIAGNOSIS — Z3A36 36 weeks gestation of pregnancy: Secondary | ICD-10-CM

## 2022-04-16 DIAGNOSIS — O34211 Maternal care for low transverse scar from previous cesarean delivery: Secondary | ICD-10-CM

## 2022-04-16 DIAGNOSIS — O4103X1 Oligohydramnios, third trimester, fetus 1: Secondary | ICD-10-CM

## 2022-04-16 LAB — GLUCOSE, CAPILLARY
Glucose-Capillary: 82 mg/dL (ref 70–99)
Glucose-Capillary: 75 mg/dL (ref 70–99)
Glucose-Capillary: 102 mg/dL — ABNORMAL HIGH (ref 70–99)

## 2022-04-16 LAB — RPR: RPR Ser Ql: NONREACTIVE

## 2022-04-16 MED ORDER — LIDOCAINE HCL (PF) 1 % IJ SOLN
INTRAMUSCULAR | Status: DC | PRN
  Administered 2022-04-16: 9 mL via EPIDURAL

## 2022-04-16 MED ORDER — DIPHENHYDRAMINE HCL 25 MG PO CAPS: 25.0000 mg | ORAL_CAPSULE | Freq: Four times a day (QID) | ORAL | Status: DC | PRN

## 2022-04-16 MED ORDER — ONDANSETRON HCL 4 MG PO TABS: 4.0000 mg | ORAL_TABLET | ORAL | Status: DC | PRN

## 2022-04-16 MED ORDER — TETANUS-DIPHTH-ACELL PERTUSSIS 5-2.5-18.5 LF-MCG/0.5 IM SUSY: 0.5000 mL | PREFILLED_SYRINGE | Freq: Once | INTRAMUSCULAR | Status: DC

## 2022-04-16 MED ORDER — IBUPROFEN 600 MG PO TABS
600.0000 mg | ORAL_TABLET | Freq: Four times a day (QID) | ORAL | Status: DC
  Administered 2022-04-16 – 2022-04-18 (×9): 600 mg via ORAL
  Filled 2022-04-16 (×9): qty 1

## 2022-04-16 MED ORDER — TRANEXAMIC ACID-NACL 1000-0.7 MG/100ML-% IV SOLN
INTRAVENOUS | Status: AC
  Filled 2022-04-16: qty 100

## 2022-04-16 MED ORDER — TRANEXAMIC ACID-NACL 1000-0.7 MG/100ML-% IV SOLN
1000.0000 mg | INTRAVENOUS | Status: AC
  Administered 2022-04-16: 1000 mg via INTRAVENOUS

## 2022-04-16 MED ORDER — MEASLES, MUMPS & RUBELLA VAC IJ SOLR: 0.5000 mL | Freq: Once | INTRAMUSCULAR | Status: DC

## 2022-04-16 MED ORDER — PRENATAL MULTIVITAMIN CH
1.0000 | ORAL_TABLET | Freq: Every day | ORAL | Status: DC
  Administered 2022-04-16 – 2022-04-18 (×3): 1 via ORAL
  Filled 2022-04-16 (×3): qty 1

## 2022-04-16 MED ORDER — ACETAMINOPHEN 325 MG PO TABS
650.0000 mg | ORAL_TABLET | ORAL | Status: DC | PRN
  Administered 2022-04-16 – 2022-04-17 (×4): 650 mg via ORAL
  Filled 2022-04-16 (×4): qty 2

## 2022-04-16 MED ORDER — BENZOCAINE-MENTHOL 20-0.5 % EX AERO
1.0000 | INHALATION_SPRAY | CUTANEOUS | Status: DC | PRN
  Administered 2022-04-16: 1 via TOPICAL
  Filled 2022-04-16: qty 56

## 2022-04-16 MED ORDER — DIBUCAINE (PERIANAL) 1 % EX OINT: 1.0000 | TOPICAL_OINTMENT | CUTANEOUS | Status: DC | PRN

## 2022-04-16 MED ORDER — ONDANSETRON HCL 4 MG/2ML IJ SOLN: 4.0000 mg | INTRAMUSCULAR | Status: DC | PRN

## 2022-04-16 MED ORDER — SIMETHICONE 80 MG PO CHEW: 80.0000 mg | CHEWABLE_TABLET | ORAL | Status: DC | PRN

## 2022-04-16 MED ORDER — SENNOSIDES-DOCUSATE SODIUM 8.6-50 MG PO TABS
2.0000 | ORAL_TABLET | ORAL | Status: DC
  Administered 2022-04-16 – 2022-04-18 (×3): 2 via ORAL
  Filled 2022-04-16 (×3): qty 2

## 2022-04-16 MED ORDER — WITCH HAZEL-GLYCERIN EX PADS: 1.0000 | MEDICATED_PAD | CUTANEOUS | Status: DC | PRN

## 2022-04-16 MED ORDER — COCONUT OIL OIL: 1.0000 | TOPICAL_OIL | Status: DC | PRN

## 2022-04-16 NOTE — Anesthesia Postprocedure Evaluation (Signed)
Anesthesia Post Note  Patient: Kara Dominguez  Procedure(s) Performed: AN AD HOC LABOR EPIDURAL     Patient location during evaluation: Mother Baby Anesthesia Type: Epidural Level of consciousness: awake and alert and oriented Pain management: satisfactory to patient Vital Signs Assessment: post-procedure vital signs reviewed and stable Respiratory status: respiratory function stable Cardiovascular status: stable Postop Assessment: no headache, no backache, epidural receding, patient able to bend at knees, no signs of nausea or vomiting, adequate PO intake and able to ambulate Anesthetic complications: no   No notable events documented.  Last Vitals:  Vitals:   04/16/22 1030 04/16/22 1347  BP: 132/78 113/63  Pulse: 88 77  Resp:  16  Temp:  36.7 C  SpO2: 98% 99%    Last Pain:  Vitals:   04/16/22 1348  TempSrc:   PainSc: 2    Pain Goal: Patients Stated Pain Goal: 0 (04/16/22 0020)                 Latoya Diskin

## 2022-04-16 NOTE — Discharge Summary (Signed)
Postpartum Discharge Summary     Patient Name: Kara Dominguez DOB: [birth year] MRN: [id number]  Date of admission: 04/15/2022 Delivery date:04/16/2022  Delivering provider: Julianne Handler  Date of discharge: 04/18/2022  Admitting diagnosis: Gestational diabetes [O24.419] Intrauterine pregnancy: [redacted]w[redacted]d    Secondary diagnosis:  Principal Problem:   Gestational diabetes Active Problems:   VBAC (vaginal birth after Cesarean)  Additional problems: T2DM, previous CS, previous VBAC x3, hx shoulder dystocia, hx PTB '@33w' , margonal cord insertion, language barrier    Discharge diagnosis: Preterm Pregnancy Delivered, VBAC, Gestational Hypertension, and Type 2 DM                                              Post partum procedures: None  Augmentation: AROM and Pitocin Complications: None  Hospital course: Induction of Labor With Vaginal Delivery   41y.o. yo [id number] admitted to the hospital 04/15/2022 for induction of labor.  Indication for induction: Gestational hypertension, TYPE 2 DM, and Oligo .  Patient had an uncomplicated labor course as follows: Membrane Rupture Time/Date: 2:12 AM ,04/16/2022   Delivery Method:VBAC, Spontaneous  Episiotomy: None  Lacerations:  1st degree  Details of delivery can be found in separate delivery note.  Patient had a routine postpartum course. Patient is discharged home 04/18/22.  Newborn Data: Birth date:04/16/2022  Birth time:5:47 AM  Gender:Female  Living status:Living  Apgars:9 ,9  Weight:3330 g   Magnesium Sulfate received: No BMZ received: No Rhophylac:N/A MMR:N/A T-DaP:Given prenatally Flu: No Transfusion:No  Physical exam  Vitals:   04/17/22 1210 04/17/22 2013 04/18/22 0517 04/18/22 1029  BP: 128/75 110/66 (!) 105/58 121/69  Pulse: 72 79 74 75  Resp:  '18 10 14  ' Temp: 98.3 F (36.8 C) 98.6 F (37 C) 97.7 F (36.5 C) 98 F (36.7 C)  TempSrc: Oral Oral Oral Oral  SpO2: 99% 100% 100%   Weight:      Height:        General: alert, cooperative, and no distress Lochia: appropriate Uterine Fundus: firm Incision: N/A DVT Evaluation: No evidence of DVT seen on physical exam. Labs: Lab Results  Component Value Date   WBC 8.0 04/15/2022   HGB 12.3 04/15/2022   HCT 36.6 04/15/2022   MCV 83.0 04/15/2022   PLT 236 04/15/2022      Latest Ref Rng & Units 04/15/2022    9:03 PM  CMP  Glucose 70 - 99 mg/dL 85   BUN 6 - 20 mg/dL 5   Creatinine 0.44 - 1.00 mg/dL 0.56   Sodium 135 - 145 mmol/L 136   Potassium 3.5 - 5.1 mmol/L 4.2   Chloride 98 - 111 mmol/L 106   CO2 22 - 32 mmol/L 21   Calcium 8.9 - 10.3 mg/dL 9.0   Total Protein 6.5 - 8.1 g/dL 6.6   Total Bilirubin 0.3 - 1.2 mg/dL 0.9   Alkaline Phos 38 - 126 U/L 255   AST 15 - 41 U/L 52   ALT 0 - 44 U/L 40    Edinburgh Score:    04/17/2022    6:18 PM  Edinburgh Postnatal Depression Scale Screening Tool  I have been able to laugh and see the funny side of things. 0  I have looked forward with enjoyment to things. 0  I have blamed myself unnecessarily when things went wrong. 0  I have been anxious or worried for no good reason. 0  I have felt scared or panicky for no good reason. 0  Things have been getting on top of me. 1  I have been so unhappy that I have had difficulty sleeping. 0  I have felt sad or miserable. 0  I have been so unhappy that I have been crying. 0  The thought of harming myself has occurred to me. 0  Edinburgh Postnatal Depression Scale Total 1     After visit meds:  Allergies as of 04/18/2022   No Known Allergies      Medication List     STOP taking these medications    aspirin EC 81 MG tablet       TAKE these medications    furosemide 20 MG tablet Commonly known as: LASIX Tome 1 tableta (20 mg en total) por va oral diariamente. (Take 1 tablet (20 mg total) by mouth daily.)   insulin NPH Human 100 UNIT/ML injection Commonly known as: NOVOLIN N 14 units with breakfast and 10 units right before bed    INSULIN SYRINGE .5CC/31GX5/16" 31G X 5/16" 0.5 ML Misc 1 Syringe by Does not apply route in the morning and at bedtime. Syringe to administer insulin to be taken with breakfast and at bedtime.   metFORMIN 500 MG tablet Commonly known as: GLUCOPHAGE Take 2 tablets (1,000 mg total) by mouth 2 (two) times daily with a meal. With breakfast and dinner   NIFEdipine 30 MG 24 hr tablet Commonly known as: ADALAT CC Tome 1 tableta (30 mg en total) por va oral diariamente. (Take 1 tablet (30 mg total) by mouth daily.)   prenatal vitamin w/FE, FA 27-1 MG Tabs tablet Take 1 tablet by mouth daily at 12 noon.         Discharge home in stable condition Infant Feeding: Breast Infant Disposition:home with mother Discharge instruction: per After Visit Summary and Postpartum booklet. Activity: Advance as tolerated. Pelvic rest for 6 weeks.  Diet: routine diet Future Appointments: Future Appointments  Date Time Provider [place]  04/19/2022  7:15 AM [facility] SCHED ROOM [facility] None  04/25/2022  2:30 PM [facility] NURSE [facility] [place]  05/26/2022  3:35 PM Donnamae Jude, MD [facility] [place]   Follow up Visit:   Please schedule this patient for a In person postpartum visit in 6 weeks with the following provider: Any provider. Additional Postpartum F/U:BP check 1 week  High risk pregnancy complicated by: GDM and HTN Delivery mode:  VBAC, Spontaneous  Anticipated Birth Control:  Depo   04/18/2022 Concepcion Living, MD

## 2022-04-16 NOTE — Progress Notes (Signed)
Labor Progress Note Kara Dominguez is a 41 y.o. [phone] at [redacted]w[redacted]d presented for IOL for Oligo and uncontrolled DM  S:  Comfortable with epidural. No complaints.  O:  BP 121/70   Pulse (!) 101   Temp 97.7 F (36.5 C) (Oral)   Resp 18   Ht 5\' 6"  (1.676 m)   Wt 99.3 kg   LMP 07/17/2021 Comment: SAB  SpO2 97%   BMI 35.35 kg/m  EFM: baseline 135 bpm/ mod variability/ + accels/ no decels  Toco/IUPC: q2 SVE: Dilation: 4 Effacement (%): 70 Station: -2 Presentation: Vertex Exam by:: [email or web address] CNM Pitocin: 10 mu/min  A/P: 41 y.o. 45 107w4d  1. Labor: latent 2. FWB: Cat I 3. Pain: epidural 4. T2DM: stable 5. Hx elevated BP: normotensive now  Consented for AROM. AROM for moderate amt clear fluid. Anticipate labor progress and VBAC.  [redacted]w[redacted]d, CNM 2:14 AM

## 2022-04-16 NOTE — Anesthesia Preprocedure Evaluation (Signed)
Anesthesia Evaluation  Patient identified by MRN, date of birth, ID band Patient awake    Reviewed: Allergy & Precautions, H&P , Patient's Chart, lab work & pertinent test results, reviewed documented beta blocker date and time   Airway Mallampati: I       Dental no notable dental hx.    Pulmonary neg pulmonary ROS,    Pulmonary exam normal        Cardiovascular negative cardio ROS Normal cardiovascular exam     Neuro/Psych negative neurological ROS  negative psych ROS   GI/Hepatic negative GI ROS, Neg liver ROS,   Endo/Other  negative endocrine ROSdiabetes, Type 2, Insulin Dependent, Oral Hypoglycemic Agents  Renal/GU negative Renal ROS  negative genitourinary   Musculoskeletal negative musculoskeletal ROS (+)   Abdominal (+) + obese,   Peds  Hematology   Anesthesia Other Findings   Reproductive/Obstetrics negative OB ROS                             Anesthesia Physical Anesthesia Plan  ASA: 2  Anesthesia Plan: Epidural   Post-op Pain Management:    Induction:   PONV Risk Score and Plan:   Airway Management Planned:   Additional Equipment:   Intra-op Plan:   Post-operative Plan:   Informed Consent: I have reviewed the patients History and Physical, chart, labs and discussed the procedure including the risks, benefits and alternatives for the proposed anesthesia with the patient or authorized representative who has indicated his/her understanding and acceptance.       Plan Discussed with:   Anesthesia Plan Comments:         Anesthesia Quick Evaluation

## 2022-04-16 NOTE — Lactation Note (Addendum)
This note was copied from a baby's chart. Lactation Consultation Note  Patient Name: Kara Dominguez ZOXWR'U Date: 04/16/2022 Reason for consult: Initial assessment;Late-preterm 34-36.6wks;Maternal endocrine disorder Age:42 hours  Visited with mom of 42 hours old LPI female, she's a P6 and experienced breastfeeding. Parents chose to supplement with formula due to serum glucose and LPI status, baby is currently on Similac 22 calorie formula. Ms. Helen Hashimoto also plans on putting baby to breast; asked her to call for assistance when needed. Set her up with a DEBP and encouraged her to pump every 3 hours or whenever baby is getting formula; this LC also showed her how to convert her DEBP kit into a hand pump. Reviewed feeding cues, LPI behavior, volumes for supplementation, size of baby's stomach, pumping schedule and lactogenesis II.   Maternal Data Has patient been taught Hand Expression?: Yes Does the patient have breastfeeding experience prior to this delivery?: Yes How long did the patient breastfeed?: 3rd for 12 months, 4th almost 3 years, 5th for 12 months  Feeding Mother's Current Feeding Choice: Breast Milk and Formula Nipple Type: Extra Slow Flow  Lactation Tools Discussed/Used Tools: Pump;Flanges Flange Size: 24 Breast pump type: Double-Electric Breast Pump Pump Education: Setup, frequency, and cleaning;Milk Storage Reason for Pumping: LPI Pumping frequency: q 3 hours (recommended) after feedings/attempts at the breast  Interventions Interventions: Breast feeding basics reviewed;DEBP;Education;LC Services brochure;LPT handout/interventions  Discharge Pump: DEBP  Plan of care Encouraged mom to put baby to breast 8-12 times/24 hours or sooner if feeding cues are present' She'll pump every 3 hours or whenever baby is getting formula Parents will continue supplementing with Similac 22 calorie formula, will follow supplementation guidelines for LPIs according to baby's  age in hours  FOB present and supportive, all questions and concerns answered, family to contact [facility] services PRN.  Consult Status Consult Status: Follow-up Date: 04/17/22 Follow-up type: In-patient   Kara Dominguez 04/16/2022, 2:12 PM

## 2022-04-16 NOTE — Progress Notes (Signed)
Labor Progress Note Kara Dominguez is a 41 y.o. [phone] at [redacted]w[redacted]d presented for IOL for Oligo and uncontrolled T2DM  S:  Comfortable with epidural. No complaints.  O:  BP 117/73   Pulse 79   Temp 97.8 F (36.6 C) (Oral)   Resp 16   Ht 5\' 6"  (1.676 m)   Wt 99.3 kg   LMP 07/17/2021 Comment: SAB  SpO2 97%   BMI 35.35 kg/m  EFM: baseline 135 bpm/ ,od variability/ + accels/ variable decels  Toco/IUPC: 2-5 SVE: Dilation: 7 Effacement (%): 90 Station: -1 Presentation: Vertex Exam by:: [email or web address] CNM Pitocin: 14 mu/min  A/P: 41 y.o. 45 [redacted]w[redacted]d  1. Labor: active, progressing well 2. FWB: Cat II 3. Pain: epidural 4. DM: stable  IUPC placed. Anticipate labor progress and VBAC.  [redacted]w[redacted]d, CNM 4:59 AM

## 2022-04-16 NOTE — Anesthesia Procedure Notes (Signed)
Epidural Patient location during procedure: OB Start time: 04/16/2022 1:12 AM End time: 04/16/2022 1:16 AM  Staffing Anesthesiologist: Leilani Able, MD Performed: anesthesiologist   Preanesthetic Checklist Completed: patient identified, IV checked, site marked, risks and benefits discussed, surgical consent, monitors and equipment checked, pre-op evaluation and timeout performed  Epidural Patient position: sitting Prep: DuraPrep and site prepped and draped Patient monitoring: continuous pulse ox and blood pressure Approach: midline Location: L3-L4 Injection technique: LOR air  Needle:  Needle type: Tuohy  Needle gauge: 17 G Needle length: 9 cm and 9 Needle insertion depth: 6 cm Catheter type: closed end flexible Catheter size: 19 Gauge Catheter at skin depth: 11 cm Test dose: negative and Other  Assessment Events: blood not aspirated, injection not painful, no injection resistance, no paresthesia and negative IV test  Additional Notes Reason for block:procedure for pain

## 2022-04-17 ENCOUNTER — Other Ambulatory Visit: Payer: Self-pay | Admitting: Advanced Practice Midwife

## 2022-04-17 LAB — GLUCOSE, CAPILLARY
Glucose-Capillary: 98 mg/dL (ref 70–99)
Glucose-Capillary: 110 mg/dL — ABNORMAL HIGH (ref 70–99)

## 2022-04-17 MED ORDER — NIFEDIPINE ER OSMOTIC RELEASE 30 MG PO TB24
30.0000 mg | ORAL_TABLET | Freq: Every day | ORAL | Status: DC
  Administered 2022-04-17 – 2022-04-18 (×2): 30 mg via ORAL
  Filled 2022-04-17 (×3): qty 1

## 2022-04-17 MED ORDER — FUROSEMIDE 20 MG PO TABS
20.0000 mg | ORAL_TABLET | Freq: Every day | ORAL | Status: DC
  Administered 2022-04-17 – 2022-04-18 (×2): 20 mg via ORAL
  Filled 2022-04-17 (×2): qty 1

## 2022-04-17 NOTE — Progress Notes (Signed)
*  Visit with in-person Spanish interpreter*  Post Partum Day #1 Subjective: no complaints; breastfeeding but reports 'no milk yet'; desires Depo for contraception; no s/s pre-e  Objective: Blood pressure 129/87, pulse 76, temperature 98 F (36.7 C), temperature source Oral, resp. rate 18, height $RemoveBe'5\' 6"'VSzQayYYR$  (1.676 m), weight 99.3 kg, last menstrual period 07/17/2021, SpO2 100 %, unknown if currently breastfeeding.  Physical Exam:  General: alert, cooperative, and no distress Lochia: appropriate Uterine Fundus: firm DVT Evaluation: No evidence of DVT seen on physical exam.  Recent Labs    04/15/22 2103  HGB 12.3  HCT 36.6    Assessment/Plan:  -On chart review, BP elevations since delivery have met criteria for treatment> will start Lasix $RemoveBef'20mg'ZlsKNgbZmu$  x 5d and ProcardiaXL $RemoveBeforeD'30mg'oJsaOrzXSzQulZ$  daily (also ordered meds to beds and PP [facility]) -No CBGs recorded since yesterday- will start qid values- Metformin $RemoveBeforeDEI'1000mg'WvAsLpMUsvzGppjH$  q am and $Remo'500mg'QkLEn$  q hs was already ordered -Discussed breastfeeding and when to expect milk -Plan for discharge tomorrow as infant is preterm and needs f/u on heart concerns    LOS: 2 days   Myrtis Ser, CNM 04/17/2022, 10:56 AM

## 2022-04-17 NOTE — Lactation Note (Signed)
This note was copied from a baby's chart. Lactation Consultation Note  Patient Name: Kara Dominguez IPJAS'N Date: 04/17/2022 Reason for consult: Follow-up assessment;Mother's request;Late-preterm 34-36.6wks;Maternal endocrine disorder;Breastfeeding assistance (Lasix, Nifedipine) Age:42 hours  LC started visit with help of Spanish interpreter Kara Dominguez [phone]. RN, Kara Dominguez came in and completed translation during visit.  LC assisted to get a deeper latch, noting increase in milk transfer with breast compression.   Plan 1. To feed based on cues 8-12x 24hr period. Birth parent to offer breasts and note signs of milk transfer.  2. Birth parent to supplement with EBM first followed by formula with pace bottle feeding and slow flow nipple. LPTI guidelines provided including supplementation volumes.  3. Birth parent to post pump after each feeding for .  All questions answered at the end of visit.   Maternal Data Has patient been taught Hand Expression?: Yes Does the patient have breastfeeding experience prior to this delivery?: Yes How long did the patient breastfeed?: 3 years with last child.  Feeding Mother's Current Feeding Choice: Breast Milk and Formula  LATCH Score Latch: Repeated attempts needed to sustain latch, nipple held in mouth throughout feeding, stimulation needed to elicit sucking reflex.  Audible Swallowing: Spontaneous and intermittent  Type of Nipple: Everted at rest and after stimulation  Comfort (Breast/Nipple): Soft / non-tender  Hold (Positioning): Assistance needed to correctly position infant at breast and maintain latch.  LATCH Score: 8   Lactation Tools Discussed/Used Tools: Pump;Flanges Flange Size: 24 Breast pump type: Double-Electric Breast Pump Pump Education: Setup, frequency, and cleaning;Milk Storage Reason for Pumping: increase stimulation Pumping frequency: post pump after each feeding for 15  mins  Interventions Interventions: Breast feeding basics reviewed;Assisted with latch;Skin to skin;Breast massage;Hand express;Breast compression;Adjust position;Support pillows;Position options;Expressed milk;DEBP;Education;Pace feeding;LC Psychologist, educational;Infant Driven Feeding Algorithm education;LPT handout/interventions  Discharge Pump: DEBP WIC Program: Yes  Consult Status Consult Status: Follow-up Date: 04/18/22 Follow-up type: In-patient    Kara Dominguez  Kara Dominguez 04/17/2022, 4:15 PM

## 2022-04-18 ENCOUNTER — Other Ambulatory Visit (HOSPITAL_COMMUNITY): Payer: Self-pay

## 2022-04-18 LAB — GC/CHLAMYDIA PROBE AMP (~~LOC~~) NOT AT ARMC
Chlamydia: NEGATIVE
Comment: NEGATIVE
Comment: NORMAL
Neisseria Gonorrhea: NEGATIVE

## 2022-04-18 LAB — GLUCOSE, CAPILLARY
Glucose-Capillary: 130 mg/dL — ABNORMAL HIGH (ref 70–99)
Glucose-Capillary: 87 mg/dL (ref 70–99)

## 2022-04-18 MED ORDER — NIFEDIPINE ER 30 MG PO TB24
30.0000 mg | ORAL_TABLET | Freq: Every day | ORAL | 0 refills | Status: AC
  Filled 2022-04-18: qty 30, 30d supply, fill #0

## 2022-04-18 MED ORDER — FUROSEMIDE 20 MG PO TABS
20.0000 mg | ORAL_TABLET | Freq: Every day | ORAL | 0 refills | Status: DC
  Filled 2022-04-18: qty 4, 4d supply, fill #0

## 2022-04-18 NOTE — Progress Notes (Signed)
Attempted to enroll patient in Hypertensive Babyscripts app for recording BP. Patient does not have an active e-mail and therefore unable to get the patient enrolled in the program. MD was informed. Instructed to provide patient with BP cuff and have her take BP twice a day and record on a paper log and instruct the patient to call MD/ CMN/ return to MAU if BP is greater than 140/90 or symptomatic. Patient to bring BP log to next appointment.

## 2022-04-18 NOTE — Progress Notes (Signed)
AMN Spanish Interpreter, Wynema Birch [phone], used for education: including discharge instructions for self and baby, monitoring BP, recording BP on paper log, attempted use of Babyscripts app and when to call the doctor for BP of 140/90 or more and symptoms. Patient and her support person verbalize understanding.

## 2022-04-18 NOTE — Lactation Note (Signed)
This note was copied from a baby's chart. Lactation Consultation Note  Patient Name: Boy Bianca Raneri EQAST'M Date: 04/18/2022 Reason for consult: Follow-up assessment;Late-preterm 34-36.6wks;Maternal endocrine disorder Age:42 hours  LC in to visit with P6 parent of LPTI on day of discharge.  Baby is at a 5% weight loss.  Baby is being breastfed and supplemented.  DEBP set up and was used last yesterday.    Breasts are filling.  Assisted with double pumping on maintenance setting.  Expressed 25 ml in 5 mins, which was fed to baby by paced bottle.  WIC to see patient tomorrow.  Offered a [employer]  Parent has a hand pump to use, concern with engorgement and milk supply reviewed.  Parent plans to speak with FOB when he arrives about the $30 deposit for the loaner.  Encouraged STS with baby as much as possible.   Encouraged feeding baby at least every 3 hrs, breast and then supplement with EBM+/formula 20-30 ml increasing as tolerated. Encouraged double pumping for 15-30 mins   LATCH Score Latch: Grasps breast easily, tongue down, lips flanged, rhythmical sucking.  Audible Swallowing: Spontaneous and intermittent  Type of Nipple: Everted at rest and after stimulation  Comfort (Breast/Nipple): Soft / non-tender (breasts are filling)  Hold (Positioning): Assistance needed to correctly position infant at breast and maintain latch.  LATCH Score: 9   Lactation Tools Discussed/Used Tools: Pump;Flanges;Bottle Flange Size: 24 Breast pump type: Double-Electric Breast Pump;Manual Pump Education: Setup, frequency, and cleaning;Milk Storage Reason for Pumping: Support milk supply/LPTI Pumping frequency: Has not pumped since yesterday  Interventions Interventions: Breast feeding basics reviewed;Assisted with latch;Skin to skin;Breast massage;Hand express;Breast compression;Adjust position;Support pillows;Position options;Expressed milk;DEBP;Hand pump;Pace  feeding  Discharge Discharge Education: Engorgement and breast care;Warning signs for feeding baby;Outpatient recommendation Pump: Manual;WIC Loaner (Offered patient a loaner as WIC would see her 8/8)  Consult Status Consult Status: Complete Date: 04/18/22 Follow-up type: Call as needed    Judee Clara 04/18/2022, 3:56 PM

## 2022-04-19 ENCOUNTER — Inpatient Hospital Stay (HOSPITAL_COMMUNITY): Payer: Self-pay

## 2022-04-19 ENCOUNTER — Inpatient Hospital Stay (HOSPITAL_COMMUNITY): Admission: AD | Admit: 2022-04-19 | Payer: Self-pay | Source: Home / Self Care | Admitting: Obstetrics & Gynecology

## 2022-04-19 LAB — CULTURE, BETA STREP (GROUP B ONLY): Strep Gp B Culture: NEGATIVE

## 2022-04-21 ENCOUNTER — Encounter: Payer: Self-pay | Admitting: Obstetrics and Gynecology

## 2022-04-22 ENCOUNTER — Ambulatory Visit: Payer: Self-pay

## 2022-04-25 ENCOUNTER — Ambulatory Visit (INDEPENDENT_AMBULATORY_CARE_PROVIDER_SITE_OTHER): Payer: Self-pay | Admitting: General Practice

## 2022-04-25 ENCOUNTER — Other Ambulatory Visit: Payer: Self-pay

## 2022-04-25 VITALS — BP 136/92 | HR 89 | Ht 64.0 in | Wt 205.0 lb

## 2022-04-25 DIAGNOSIS — Z0130 Encounter for examination of blood pressure without abnormal findings: Secondary | ICD-10-CM

## 2022-04-25 NOTE — Progress Notes (Signed)
Patient presents to office today for BP check following vaginal delivery on 8/5. Her history is significant for GHTN. She takes nifedipine 30mg  daily. Patient reports headache on Friday but thinks it was because of changing her medicines- denies dizziness or blurry vision. No edema noted. BP 139/90 with repeat BP 136/92. Reviewed with patient how to check her blood pressure at home and how to use her machine. Advised patient to call Monday if she notices elevated BP at home or headaches, dizziness, or blurry vision. Patient will follow up at pp visit on 9/14.  10/14 RN BSN 04/25/22

## 2022-05-13 ENCOUNTER — Other Ambulatory Visit: Payer: Self-pay | Admitting: Family Medicine

## 2022-05-25 ENCOUNTER — Other Ambulatory Visit: Payer: Self-pay | Admitting: *Deleted

## 2022-05-25 MED ORDER — METFORMIN HCL 500 MG PO TABS: 1000.0000 mg | ORAL_TABLET | Freq: Two times a day (BID) | ORAL | 5 refills | Status: DC

## 2022-05-25 NOTE — Progress Notes (Signed)
Fax refill request received from [facility] pharmacy. I called pt w/interpreter Eda Royal and confirmed that she is still taking the medication however not as prescribed. She is taking 2 tablets in the morning and one tablet @ night.  She stated thsi is the dose she has always taken and was not aware of needing tot ake 2 tablets in the evening. She reports fasting blood sugars consistently in the 80's. I advised pt that I will send refill and she should continue taking the dose that she has been taking. She was not aware that her post partum appointment had been changed from 9/14 to 10/9. I advised her of the new appt date and time.  She voiced understanding of all information and instructions given.

## 2022-05-26 ENCOUNTER — Ambulatory Visit: Payer: Self-pay | Admitting: Family Medicine

## 2022-06-20 ENCOUNTER — Encounter: Payer: Self-pay | Admitting: Family Medicine

## 2022-06-20 ENCOUNTER — Ambulatory Visit (INDEPENDENT_AMBULATORY_CARE_PROVIDER_SITE_OTHER): Payer: Medicaid Other | Admitting: Family Medicine

## 2022-06-20 ENCOUNTER — Other Ambulatory Visit: Payer: Self-pay

## 2022-06-20 VITALS — BP 135/91 | HR 87 | Wt 198.7 lb

## 2022-06-20 DIAGNOSIS — E119 Type 2 diabetes mellitus without complications: Secondary | ICD-10-CM | POA: Diagnosis not present

## 2022-06-20 DIAGNOSIS — Z3042 Encounter for surveillance of injectable contraceptive: Secondary | ICD-10-CM | POA: Diagnosis not present

## 2022-06-20 MED ORDER — MEDROXYPROGESTERONE ACETATE 150 MG/ML IM SUSP
150.0000 mg | Freq: Once | INTRAMUSCULAR | Status: AC
  Administered 2022-06-20: 150 mg via INTRAMUSCULAR

## 2022-06-20 NOTE — Patient Instructions (Signed)
General surgery [facility]--possible umbilical hernia

## 2022-06-20 NOTE — Progress Notes (Signed)
Post Partum Visit Note  Kara Dominguez is a 41 y.o. [phone] female who presents for a postpartum visit. She is 9 weeks 2 days postpartum following a normal spontaneous vaginal delivery.  I have fully reviewed the prenatal and intrapartum course. The delivery was at [redacted]w[redacted]d.  Anesthesia: epidural. Postpartum course has been normal. Baby is doing well. Baby is feeding by breast. Bleeding no bleeding. Bowel function is normal. Bladder function is normal. Patient is sexually active. Contraception method is Depo-Provera injections. Postpartum depression screening: negative.   The pregnancy intention screening data noted above was reviewed. Potential methods of contraception were discussed. The patient elected to proceed with No data recorded.   Edinburgh Postnatal Depression Scale - 06/20/22 1415       Edinburgh Postnatal Depression Scale:  In the Past 7 Days   I have been able to laugh and see the funny side of things. 0    I have looked forward with enjoyment to things. 0    I have blamed myself unnecessarily when things went wrong. 0    I have been anxious or worried for no good reason. 0    I have felt scared or panicky for no good reason. 0    Things have been getting on top of me. 0    I have been so unhappy that I have had difficulty sleeping. 0    I have felt sad or miserable. 0    I have been so unhappy that I have been crying. 0    The thought of harming myself has occurred to me. 0    Edinburgh Postnatal Depression Scale Total 0             Health Maintenance Due  Topic Date Due   COVID-19 Vaccine (1) Never done   FOOT EXAM  Never done   OPHTHALMOLOGY EXAM  Never done   PAP SMEAR-Modifier  02/01/2021   INFLUENZA VACCINE  04/12/2022   HEMOGLOBIN A1C  05/04/2022    The following portions of the patient's history were reviewed and updated as appropriate: allergies, current medications, past family history, past medical history, past social history, past surgical  history, and problem list.  Review of Systems Pertinent items noted in HPI and remainder of comprehensive ROS otherwise negative.  Objective:  BP (!) 135/91   Pulse 87   Wt 198 lb 11.2 oz (90.1 kg)   LMP  (LMP Unknown)   Breastfeeding Yes   BMI 34.11 kg/m    General:  alert, cooperative, and appears stated age  Lungs: Normal effort  Heart:  regular rate and rhythm  Abdomen: Soft, smallumbilical hernia noted         Assessment:    Normal postpartum exam.   Plan:   Essential components of care per ACOG recommendations:  1.  Mood and well being: Patient with negative depression screening today. Reviewed local resources for support.  - Patient tobacco use? No.   - hx of drug use? No.    2. Infant care and feeding:  -Patient currently breastmilk feeding? Yes. Reviewed importance of draining breast regularly to support lactation.  -Social determinants of health (SDOH) reviewed in EPIC. No concerns  3. Sexuality, contraception and birth spacing - Patient does not want a pregnancy in the next year.  Desired family size is 6 children.  - Reviewed reproductive life planning. Reviewed contraceptive methods based on pt preferences and effectiveness.  Patient desired Hormonal Injection today.   - Discussed birth spacing  of 18 months  4. Sleep and fatigue -Encouraged family/partner/community support of 4 hrs of uninterrupted sleep to help with mood and fatigue  5. Physical Recovery  - Discussed patients delivery and complications. She describes her labor as good. - Patient had a Vaginal, no problems at delivery. Patient had no laceration. Perineal healing reviewed. Patient expressed understanding - Patient has urinary incontinence? No. - Patient is safe to resume physical and sexual activity  6.  Health Maintenance - HM due items addressed Yes - Last pap smear  Diagnosis  Date Value Ref Range Status  02/01/2018   Final   NEGATIVE FOR INTRAEPITHELIAL LESIONS OR MALIGNANCY.    Pap smear not done at today's visit.  BCCCP info given -Breast Cancer screening indicated? No.   7. Chronic Disease/Pregnancy Condition follow up: Hypertension and Gestational Diabetes  - PCP follow up  Reva Bores, MD [facility], [facility]

## 2022-06-21 LAB — HEMOGLOBIN A1C
Est. average glucose Bld gHb Est-mCnc: 120 mg/dL
Hgb A1c MFr Bld: 5.8 % — ABNORMAL HIGH (ref 4.8–5.6)

## 2022-06-22 ENCOUNTER — Telehealth: Payer: Self-pay

## 2022-06-22 NOTE — Telephone Encounter (Signed)
-----   Message from Donnamae Jude, MD sent at 06/22/2022  7:55 AM EDT ----- Her A1C is good. Continue Metformin.

## 2022-06-22 NOTE — Telephone Encounter (Signed)
Call placed to pt with interpreter Eda R. Spoke with pt. Pt given results and recommendations per Dr Kennon Rounds. Pt verbalized understanding.  Colletta Maryland, RNC

## 2022-08-22 ENCOUNTER — Other Ambulatory Visit: Payer: Medicaid Other | Admitting: Hematology and Oncology

## 2022-08-22 DIAGNOSIS — Z124 Encounter for screening for malignant neoplasm of cervix: Secondary | ICD-10-CM

## 2022-08-22 NOTE — Progress Notes (Signed)
Patient: Kara Dominguez           Date of Birth: [birth year]           MRN: [id number] Visit Date: 08/22/2022 PCP: Tereso Newcomer, MD     Cervical Exam  Abnormal Observations: Normal exam. Recommendations: Will repeat Pap smear in 5 years if normal and negative HPV.       Patient's History Patient Active Problem List   Diagnosis Date Noted   Type 2 diabetes mellitus without complication, without long-term current use of insulin (HCC) 06/20/2022   Language barrier 06/08/2018   History of VBAC x 3 02/01/2018   Seborrheic dermatitis 01/03/2017   Past Medical History:  Diagnosis Date   Anemia    Gestational diabetes    Obesity in pregnancy 05/04/2018   Preterm labor    Type 2 diabetes mellitus during pregnancy, antepartum 04/06/2018   Current Diabetic Medications:  Metformin and Insulin  [x]  Aspirin 81 mg daily after 12 weeks (? A2/B GDM)  For A2/B GDM or higher classes of DM [x]  Diabetes Education and Testing Supplies [x]  Nutrition Counsult [x]  Fetal ECHO after 20 weeks  [ ]  Eye exam for retina evaluation   Baseline and surveillance labs (pulled in from , refresh links as needed)  Lab Results  Component Value Date   CREATI    Family History  Problem Relation Age of Onset   Asthma Neg Hx    Cancer Neg Hx    Diabetes Neg Hx    Heart disease Neg Hx    Hypertension Neg Hx    Stroke Neg Hx     Social History   Occupational History   Not on file  Tobacco Use   Smoking status: Never   Smokeless tobacco: Never  Vaping Use   Vaping Use: Never used  Substance and Sexual Activity   Alcohol use: No   Drug use: No   Sexual activity: Yes    Birth control/protection: None

## 2022-08-24 LAB — CYTOLOGY - PAP
Comment: NEGATIVE
Diagnosis: NEGATIVE
High risk HPV: NEGATIVE

## 2022-08-30 ENCOUNTER — Telehealth: Payer: Self-pay

## 2022-08-30 NOTE — Telephone Encounter (Signed)
With SPANISH Interpreter, Erika McReynolds, pt has been advised of her negative PAP/HPV test results. Pt has been advised to repeat the testing in five years. 

## 2022-09-07 ENCOUNTER — Ambulatory Visit: Payer: Medicaid Other

## 2022-12-07 ENCOUNTER — Other Ambulatory Visit: Payer: Self-pay

## 2022-12-07 MED ORDER — METFORMIN HCL 500 MG PO TABS: 1000.0000 mg | ORAL_TABLET | Freq: Two times a day (BID) | ORAL | 1 refills | Status: AC

## 2023-12-21 ENCOUNTER — Ambulatory Visit (HOSPITAL_COMMUNITY)
Admission: EM | Admit: 2023-12-21 | Discharge: 2023-12-21 | Disposition: A | Discharge status: Home / Self Care | Payer: Self-pay | Attending: Emergency Medicine | Admitting: Emergency Medicine

## 2023-12-21 ENCOUNTER — Encounter (HOSPITAL_COMMUNITY): Payer: Self-pay

## 2023-12-21 DIAGNOSIS — T162XXA Foreign body in left ear, initial encounter: Secondary | ICD-10-CM

## 2023-12-21 DIAGNOSIS — H60392 Other infective otitis externa, left ear: Secondary | ICD-10-CM

## 2023-12-21 MED ORDER — AMOXICILLIN-POT CLAVULANATE 875-125 MG PO TABS: 1.0000 | ORAL_TABLET | Freq: Two times a day (BID) | ORAL | 0 refills | Status: AC

## 2023-12-21 MED ORDER — OFLOXACIN 0.3 % OT SOLN: 5.0000 [drp] | Freq: Every day | OTIC | 0 refills | Status: AC
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
## 2023-12-21 NOTE — ED Triage Notes (Signed)
 Patient here today with c/o left ear pain and decreased hearing X 1 month.

## 2023-12-21 NOTE — Discharge Instructions (Addendum)
 Starting Augmentin twice daily for 7 days. Apply 5 drops of ofloxacin into your ear once daily for 7 days. Return here if symptoms persist or worsen.  Comenzar con Augmentin dos veces al da durante 7 [address] Aplicar 5 gotas de ofloxacino en el odo una vez al da durante 7 [address] Regresar aqu si los sntomas persisten o empeoran.
